# Patient Record
Sex: Male | Born: 1940 | Race: White | Hispanic: No | Marital: Single | Smoking: Former smoker
Health system: Southern US, Community
[De-identification: ages and names within clinical notes are randomized; demographics above are authoritative.]

## PROBLEM LIST (undated history)

## (undated) DIAGNOSIS — E7800 Pure hypercholesterolemia, unspecified: Secondary | ICD-10-CM

## (undated) DIAGNOSIS — I214 Non-ST elevation (NSTEMI) myocardial infarction: Secondary | ICD-10-CM

## (undated) DIAGNOSIS — K5641 Fecal impaction: Secondary | ICD-10-CM

## (undated) DIAGNOSIS — D649 Anemia, unspecified: Secondary | ICD-10-CM

## (undated) DIAGNOSIS — I639 Cerebral infarction, unspecified: Secondary | ICD-10-CM

## (undated) DIAGNOSIS — H47619 Cortical blindness, unspecified side of brain: Secondary | ICD-10-CM

## (undated) DIAGNOSIS — K219 Gastro-esophageal reflux disease without esophagitis: Secondary | ICD-10-CM

## (undated) DIAGNOSIS — I10 Essential (primary) hypertension: Secondary | ICD-10-CM

## (undated) DIAGNOSIS — F17200 Nicotine dependence, unspecified, uncomplicated: Secondary | ICD-10-CM

---

## 2009-12-11 ENCOUNTER — Encounter: Admission: RE | Admit: 2009-12-11 | Discharge: 2009-12-11 | Payer: Self-pay | Admitting: Neurology

## 2009-12-25 ENCOUNTER — Telehealth (INDEPENDENT_AMBULATORY_CARE_PROVIDER_SITE_OTHER): Payer: Self-pay | Admitting: *Deleted

## 2009-12-30 ENCOUNTER — Ambulatory Visit: Payer: Self-pay | Admitting: Internal Medicine

## 2009-12-30 ENCOUNTER — Ambulatory Visit: Payer: Self-pay

## 2009-12-30 ENCOUNTER — Encounter (INDEPENDENT_AMBULATORY_CARE_PROVIDER_SITE_OTHER): Payer: Self-pay | Admitting: Neurology

## 2009-12-30 ENCOUNTER — Ambulatory Visit (HOSPITAL_COMMUNITY): Admission: RE | Admit: 2009-12-30 | Discharge: 2009-12-30 | Payer: Self-pay | Admitting: Neurology

## 2010-01-31 ENCOUNTER — Emergency Department (HOSPITAL_COMMUNITY): Admission: EM | Admit: 2010-01-31 | Discharge: 2010-01-31 | Payer: Self-pay | Admitting: Emergency Medicine

## 2010-04-08 NOTE — Progress Notes (Signed)
Summary: event monitor  Phone Note Outgoing Call Call back at [phone]   Call placed by: Stanton Kidney, EMT-P,  December 25, 2009 2:36 PM Call placed to: Patient Action Taken: Phone Call Completed, Appt scheduled Summary of Call: Event monitor scheduled same day as echo, 12/30/09. Stanton Kidney, EMT-P  December 25, 2009 2:36 PM

## 2011-03-03 ENCOUNTER — Inpatient Hospital Stay (HOSPITAL_COMMUNITY): Payer: Medicare Other

## 2011-03-03 ENCOUNTER — Inpatient Hospital Stay (HOSPITAL_COMMUNITY)
Admission: EM | Admit: 2011-03-03 | Discharge: 2011-03-27 | DRG: 871 | Disposition: A | Discharge status: Skilled Nursing Facility | Payer: Medicare Other | Attending: Internal Medicine | Admitting: Internal Medicine

## 2011-03-03 ENCOUNTER — Encounter: Payer: Self-pay | Admitting: Emergency Medicine

## 2011-03-03 ENCOUNTER — Emergency Department (HOSPITAL_COMMUNITY): Payer: Medicare Other

## 2011-03-03 ENCOUNTER — Other Ambulatory Visit: Payer: Self-pay

## 2011-03-03 DIAGNOSIS — R1310 Dysphagia, unspecified: Secondary | ICD-10-CM | POA: Clinically undetermined

## 2011-03-03 DIAGNOSIS — J189 Pneumonia, unspecified organism: Secondary | ICD-10-CM | POA: Diagnosis present

## 2011-03-03 DIAGNOSIS — R7309 Other abnormal glucose: Secondary | ICD-10-CM | POA: Diagnosis present

## 2011-03-03 DIAGNOSIS — J9600 Acute respiratory failure, unspecified whether with hypoxia or hypercapnia: Secondary | ICD-10-CM | POA: Diagnosis present

## 2011-03-03 DIAGNOSIS — T380X5A Adverse effect of glucocorticoids and synthetic analogues, initial encounter: Secondary | ICD-10-CM | POA: Diagnosis present

## 2011-03-03 DIAGNOSIS — R310 Gross hematuria: Secondary | ICD-10-CM | POA: Diagnosis not present

## 2011-03-03 DIAGNOSIS — D75839 Thrombocytosis, unspecified: Secondary | ICD-10-CM | POA: Diagnosis present

## 2011-03-03 DIAGNOSIS — A419 Sepsis, unspecified organism: Principal | ICD-10-CM | POA: Diagnosis present

## 2011-03-03 DIAGNOSIS — D649 Anemia, unspecified: Secondary | ICD-10-CM | POA: Diagnosis present

## 2011-03-03 DIAGNOSIS — K441 Diaphragmatic hernia with gangrene: Secondary | ICD-10-CM | POA: Diagnosis present

## 2011-03-03 DIAGNOSIS — G92 Toxic encephalopathy: Secondary | ICD-10-CM | POA: Diagnosis present

## 2011-03-03 DIAGNOSIS — E871 Hypo-osmolality and hyponatremia: Secondary | ICD-10-CM | POA: Diagnosis not present

## 2011-03-03 DIAGNOSIS — E860 Dehydration: Secondary | ICD-10-CM | POA: Diagnosis present

## 2011-03-03 DIAGNOSIS — J9690 Respiratory failure, unspecified, unspecified whether with hypoxia or hypercapnia: Secondary | ICD-10-CM | POA: Diagnosis present

## 2011-03-03 DIAGNOSIS — I4891 Unspecified atrial fibrillation: Secondary | ICD-10-CM

## 2011-03-03 DIAGNOSIS — Z8673 Personal history of transient ischemic attack (TIA), and cerebral infarction without residual deficits: Secondary | ICD-10-CM

## 2011-03-03 DIAGNOSIS — K5900 Constipation, unspecified: Secondary | ICD-10-CM | POA: Diagnosis present

## 2011-03-03 DIAGNOSIS — R1110 Vomiting, unspecified: Secondary | ICD-10-CM

## 2011-03-03 DIAGNOSIS — R509 Fever, unspecified: Secondary | ICD-10-CM | POA: Diagnosis present

## 2011-03-03 DIAGNOSIS — E876 Hypokalemia: Secondary | ICD-10-CM | POA: Diagnosis present

## 2011-03-03 DIAGNOSIS — R739 Hyperglycemia, unspecified: Secondary | ICD-10-CM | POA: Diagnosis present

## 2011-03-03 DIAGNOSIS — H47619 Cortical blindness, unspecified side of brain: Secondary | ICD-10-CM | POA: Clinically undetermined

## 2011-03-03 DIAGNOSIS — E870 Hyperosmolality and hypernatremia: Secondary | ICD-10-CM | POA: Diagnosis not present

## 2011-03-03 DIAGNOSIS — R7989 Other specified abnormal findings of blood chemistry: Secondary | ICD-10-CM | POA: Diagnosis present

## 2011-03-03 DIAGNOSIS — E785 Hyperlipidemia, unspecified: Secondary | ICD-10-CM | POA: Diagnosis present

## 2011-03-03 DIAGNOSIS — J811 Chronic pulmonary edema: Secondary | ICD-10-CM | POA: Diagnosis not present

## 2011-03-03 DIAGNOSIS — K219 Gastro-esophageal reflux disease without esophagitis: Secondary | ICD-10-CM | POA: Diagnosis present

## 2011-03-03 DIAGNOSIS — G929 Unspecified toxic encephalopathy: Secondary | ICD-10-CM | POA: Diagnosis present

## 2011-03-03 DIAGNOSIS — R4182 Altered mental status, unspecified: Secondary | ICD-10-CM

## 2011-03-03 DIAGNOSIS — D638 Anemia in other chronic diseases classified elsewhere: Secondary | ICD-10-CM | POA: Diagnosis present

## 2011-03-03 DIAGNOSIS — M6282 Rhabdomyolysis: Secondary | ICD-10-CM | POA: Diagnosis present

## 2011-03-03 DIAGNOSIS — G8929 Other chronic pain: Secondary | ICD-10-CM | POA: Diagnosis present

## 2011-03-03 DIAGNOSIS — D473 Essential (hemorrhagic) thrombocythemia: Secondary | ICD-10-CM | POA: Diagnosis present

## 2011-03-03 DIAGNOSIS — I214 Non-ST elevation (NSTEMI) myocardial infarction: Secondary | ICD-10-CM | POA: Diagnosis present

## 2011-03-03 DIAGNOSIS — I10 Essential (primary) hypertension: Secondary | ICD-10-CM | POA: Diagnosis present

## 2011-03-03 DIAGNOSIS — K5641 Fecal impaction: Secondary | ICD-10-CM | POA: Diagnosis not present

## 2011-03-03 DIAGNOSIS — D72829 Elevated white blood cell count, unspecified: Secondary | ICD-10-CM | POA: Diagnosis present

## 2011-03-03 HISTORY — DX: Nicotine dependence, unspecified, uncomplicated: F17.200

## 2011-03-03 HISTORY — DX: Fecal impaction: K56.41

## 2011-03-03 HISTORY — DX: Gastro-esophageal reflux disease without esophagitis: K21.9

## 2011-03-03 HISTORY — DX: Non-ST elevation (NSTEMI) myocardial infarction: I21.4

## 2011-03-03 HISTORY — DX: Anemia, unspecified: D64.9

## 2011-03-03 HISTORY — DX: Cortical blindness, unspecified side of brain: H47.619

## 2011-03-03 HISTORY — DX: Cerebral infarction, unspecified: I63.9

## 2011-03-03 HISTORY — DX: Pure hypercholesterolemia, unspecified: E78.00

## 2011-03-03 HISTORY — DX: Essential (primary) hypertension: I10

## 2011-03-03 LAB — COMPREHENSIVE METABOLIC PANEL
ALT: 78 U/L — ABNORMAL HIGH (ref 0–53)
AST: 220 U/L — ABNORMAL HIGH (ref 0–37)
Alkaline Phosphatase: 47 U/L (ref 39–117)
CO2: 23 mEq/L (ref 19–32)
GFR calc Af Amer: 59 mL/min — ABNORMAL LOW (ref >90)
Glucose, Bld: 115 mg/dL — ABNORMAL HIGH (ref 70–99)
Total Protein: 8.4 g/dL — ABNORMAL HIGH (ref 6.0–8.3)

## 2011-03-03 LAB — URINALYSIS, ROUTINE W REFLEX MICROSCOPIC
Bilirubin Urine: NEGATIVE
Glucose, UA: NEGATIVE mg/dL
Ketones, ur: NEGATIVE mg/dL
Leukocytes, UA: NEGATIVE
Nitrite: NEGATIVE
Protein, ur: 100 mg/dL — AB
Specific Gravity, Urine: 1.025 (ref 1.005–1.030)
Urobilinogen, UA: 0.2 mg/dL (ref 0.0–1.0)
pH: 5.5 (ref 5.0–8.0)

## 2011-03-03 LAB — COMPREHENSIVE METABOLIC PANEL WITH GFR
Albumin: 3.3 g/dL — ABNORMAL LOW (ref 3.5–5.2)
BUN: 57 mg/dL — ABNORMAL HIGH (ref 6–23)
Calcium: 9.9 mg/dL (ref 8.4–10.5)
Chloride: 105 meq/L (ref 96–112)
Creatinine, Ser: 1.35 mg/dL (ref 0.50–1.35)
GFR calc non Af Amer: 51 mL/min — ABNORMAL LOW (ref >90)
Potassium: 3.2 meq/L — ABNORMAL LOW (ref 3.5–5.1)
Sodium: 144 meq/L (ref 135–145)
Total Bilirubin: 1.1 mg/dL (ref 0.3–1.2)

## 2011-03-03 LAB — DIFFERENTIAL
Basophils Absolute: 0 10*3/uL (ref 0.0–0.1)
Basophils Relative: 0 % (ref 0–1)
Eosinophils Absolute: 0 10*3/uL (ref 0.0–0.7)
Eosinophils Relative: 0 % (ref 0–5)
Lymphocytes Relative: 3 % — ABNORMAL LOW (ref 12–46)
Lymphs Abs: 0.4 K/uL — ABNORMAL LOW (ref 0.7–4.0)
Monocytes Absolute: 0.2 10*3/uL (ref 0.1–1.0)
Monocytes Relative: 2 % — ABNORMAL LOW (ref 3–12)
Neutro Abs: 11.7 K/uL — ABNORMAL HIGH (ref 1.7–7.7)
Neutrophils Relative %: 95 % — ABNORMAL HIGH (ref 43–77)

## 2011-03-03 LAB — CARDIAC PANEL(CRET KIN+CKTOT+MB+TROPI)
CK, MB: 9 ng/mL (ref 0.3–4.0)
Relative Index: 0.1 (ref 0.0–2.5)
Total CK: 6643 U/L — ABNORMAL HIGH (ref 7–232)
Troponin I: 0.4 ng/mL (ref <0.30)

## 2011-03-03 LAB — CBC
HCT: 36.6 % — ABNORMAL LOW (ref 39.0–52.0)
Hemoglobin: 12.7 g/dL — ABNORMAL LOW (ref 13.0–17.0)
MCH: 33.1 pg (ref 26.0–34.0)
MCHC: 34.7 g/dL (ref 30.0–36.0)
MCV: 95.3 fL (ref 78.0–100.0)
Platelets: 428 10*3/uL — ABNORMAL HIGH (ref 150–400)
RBC: 3.84 MIL/uL — ABNORMAL LOW (ref 4.22–5.81)
RDW: 13.4 % (ref 11.5–15.5)
WBC: 12.3 K/uL — ABNORMAL HIGH (ref 4.0–10.5)

## 2011-03-03 LAB — URINE MICROSCOPIC-ADD ON

## 2011-03-03 LAB — BLOOD GAS, ARTERIAL
Acid-base deficit: 1.3 mmol/L (ref 0.0–2.0)
Bicarbonate: 18.6 meq/L — ABNORMAL LOW (ref 20.0–24.0)
Bicarbonate: 18.4 mEq/L — ABNORMAL LOW (ref 20.0–24.0)
O2 Content: 3 L/min
O2 Saturation: 84.2 %
TCO2: 15.7 mmol/L (ref 0–100)
pCO2 arterial: 20.4 mmHg — ABNORMAL LOW (ref 35.0–45.0)
pCO2 arterial: 23.3 mmHg — ABNORMAL LOW (ref 35.0–45.0)
pH, Arterial: 7.567 — ABNORMAL HIGH (ref 7.350–7.450)
pH, Arterial: 7.51 — ABNORMAL HIGH (ref 7.350–7.450)
pO2, Arterial: 44 mmHg — ABNORMAL LOW (ref 80.0–100.0)
pO2, Arterial: 55.3 mmHg — ABNORMAL LOW (ref 80.0–100.0)

## 2011-03-03 LAB — ETHANOL: Alcohol, Ethyl (B): <11 mg/dL (ref 0–11)

## 2011-03-03 LAB — LACTIC ACID, PLASMA
Lactic Acid, Venous: 3.3 mmol/L — ABNORMAL HIGH (ref 0.5–2.2)
Lactic Acid, Venous: 2.2 mmol/L (ref 0.5–2.2)

## 2011-03-03 LAB — PHOSPHORUS: Phosphorus: 1.9 mg/dL — ABNORMAL LOW (ref 2.3–4.6)

## 2011-03-03 LAB — PROTIME-INR
INR: 1.27 (ref 0.00–1.49)
Prothrombin Time: 16.2 seconds — ABNORMAL HIGH (ref 11.6–15.2)

## 2011-03-03 LAB — APTT: aPTT: 48 seconds — ABNORMAL HIGH (ref 24–37)

## 2011-03-03 MED ORDER — ENOXAPARIN SODIUM 40 MG/0.4ML ~~LOC~~ SOLN
40.0000 mg | SUBCUTANEOUS | Status: DC
  Administered 2011-03-04 – 2011-03-17 (×14): 40 mg via SUBCUTANEOUS
  Filled 2011-03-03 (×14): qty 0.4

## 2011-03-03 MED ORDER — METOPROLOL TARTRATE 1 MG/ML IV SOLN
5.0000 mg | Freq: Four times a day (QID) | INTRAVENOUS | Status: DC | PRN
  Filled 2011-03-03: qty 5

## 2011-03-03 MED ORDER — SODIUM CHLORIDE 0.9 % IV SOLN
INTRAVENOUS | Status: DC
  Administered 2011-03-03 – 2011-03-04 (×2): 100 mL via INTRAVENOUS
  Administered 2011-03-04: 75 mL via INTRAVENOUS
  Administered 2011-03-04: 1000 mL via INTRAVENOUS

## 2011-03-03 MED ORDER — ONDANSETRON HCL 4 MG/2ML IJ SOLN
4.0000 mg | Freq: Four times a day (QID) | INTRAMUSCULAR | Status: DC | PRN
  Administered 2011-03-12: 4 mg via INTRAVENOUS
  Filled 2011-03-03: qty 2

## 2011-03-03 MED ORDER — MORPHINE SULFATE 2 MG/ML IJ SOLN
2.0000 mg | Freq: Once | INTRAMUSCULAR | Status: AC
  Administered 2011-03-03: 2 mg via INTRAVENOUS
  Filled 2011-03-03: qty 1

## 2011-03-03 MED ORDER — OSELTAMIVIR PHOSPHATE 75 MG PO CAPS
75.0000 mg | ORAL_CAPSULE | Freq: Two times a day (BID) | ORAL | Status: DC
  Filled 2011-03-03: qty 1

## 2011-03-03 MED ORDER — HALOPERIDOL LACTATE 5 MG/ML IJ SOLN
5.0000 mg | Freq: Four times a day (QID) | INTRAMUSCULAR | Status: DC | PRN
  Filled 2011-03-03: qty 1

## 2011-03-03 MED ORDER — SODIUM CHLORIDE 0.9 % IV BOLUS (SEPSIS)
1000.0000 mL | Freq: Once | INTRAVENOUS | Status: AC
  Administered 2011-03-03: 1000 mL via INTRAVENOUS

## 2011-03-03 MED ORDER — PIPERACILLIN-TAZOBACTAM 3.375 G IVPB
3.3750 g | Freq: Three times a day (TID) | INTRAVENOUS | Status: DC
  Administered 2011-03-03: 3.375 g via INTRAVENOUS
  Filled 2011-03-03 (×3): qty 50

## 2011-03-03 MED ORDER — ACETAMINOPHEN 325 MG PO TABS
650.0000 mg | ORAL_TABLET | Freq: Four times a day (QID) | ORAL | Status: DC | PRN
  Administered 2011-03-05 – 2011-03-20 (×7): 650 mg via ORAL
  Filled 2011-03-03 (×6): qty 2

## 2011-03-03 MED ORDER — SODIUM CHLORIDE 0.9 % IV BOLUS (SEPSIS)
500.0000 mL | Freq: Once | INTRAVENOUS | Status: AC
  Administered 2011-03-03: 500 mL via INTRAVENOUS

## 2011-03-03 MED ORDER — PIPERACILLIN-TAZOBACTAM 3.375 G IVPB
INTRAVENOUS | Status: AC
  Filled 2011-03-03: qty 50

## 2011-03-03 MED ORDER — ACETAMINOPHEN 650 MG RE SUPP
650.0000 mg | Freq: Four times a day (QID) | RECTAL | Status: DC | PRN
  Administered 2011-03-04: 650 mg via RECTAL
  Filled 2011-03-03 (×2): qty 1

## 2011-03-03 MED ORDER — THIAMINE HCL 100 MG/ML IJ SOLN
100.0000 mg | Freq: Once | INTRAMUSCULAR | Status: AC
Start: 2011-03-03 — End: 2011-03-03
  Administered 2011-03-03: 100 mg via INTRAVENOUS
  Filled 2011-03-03: qty 2

## 2011-03-03 MED ORDER — PIPERACILLIN-TAZOBACTAM 3.375 G IVPB
3.3750 g | Freq: Once | INTRAVENOUS | Status: AC
  Administered 2011-03-03: 3.375 g via INTRAVENOUS
  Filled 2011-03-03: qty 50

## 2011-03-03 MED ORDER — DEXTROSE 5 % IV SOLN
500.0000 mg | INTRAVENOUS | Status: DC
  Administered 2011-03-04 – 2011-03-14 (×11): 500 mg via INTRAVENOUS
  Filled 2011-03-03 (×12): qty 500

## 2011-03-03 MED ORDER — IPRATROPIUM BROMIDE 0.02 % IN SOLN: 0.5000 mg | RESPIRATORY_TRACT | Status: DC

## 2011-03-03 MED ORDER — METHYLPREDNISOLONE SODIUM SUCC 125 MG IJ SOLR
80.0000 mg | Freq: Three times a day (TID) | INTRAMUSCULAR | Status: DC
  Administered 2011-03-03 – 2011-03-06 (×7): 80 mg via INTRAVENOUS
  Filled 2011-03-03 (×7): qty 2

## 2011-03-03 MED ORDER — FOLIC ACID 1 MG PO TABS
1.0000 mg | ORAL_TABLET | Freq: Once | ORAL | Status: DC
  Filled 2011-03-03: qty 1

## 2011-03-03 MED ORDER — ACETAMINOPHEN 650 MG RE SUPP
650.0000 mg | Freq: Once | RECTAL | Status: AC
  Administered 2011-03-03: 650 mg via RECTAL
  Filled 2011-03-03: qty 1

## 2011-03-03 MED ORDER — LORAZEPAM 2 MG/ML IJ SOLN
0.5000 mg | Freq: Once | INTRAMUSCULAR | Status: AC
  Administered 2011-03-03: 0.5 mg via INTRAVENOUS
  Filled 2011-03-03: qty 1

## 2011-03-03 MED ORDER — ALBUTEROL SULFATE (5 MG/ML) 0.5% IN NEBU
2.5000 mg | INHALATION_SOLUTION | RESPIRATORY_TRACT | Status: DC | PRN
  Administered 2011-03-03: 2.5 mg via RESPIRATORY_TRACT
  Filled 2011-03-03: qty 0.5

## 2011-03-03 MED ORDER — ACETAMINOPHEN 325 MG PO TABS
650.0000 mg | ORAL_TABLET | Freq: Once | ORAL | Status: DC
  Filled 2011-03-03: qty 2

## 2011-03-03 MED ORDER — ONDANSETRON HCL 4 MG PO TABS: 4.0000 mg | ORAL_TABLET | Freq: Four times a day (QID) | ORAL | Status: DC | PRN

## 2011-03-03 NOTE — Progress Notes (Addendum)
2020-Pt's saturation began to drop and RT was notified and venti mask was applied at 100% 0120-E-Link MD initiated steps to intubate pt. 02 saturations were low and RR in upper 30's to low 40's. ED physician was notified and came and intubated pt at 0045 and pt tolerated well. Propofol was ordered and administered. Propofol was discontinued because of low BP and fentanyl and versed was then ordered.   0125-attempted to contact family but unable to reach anyone.

## 2011-03-03 NOTE — ED Notes (Signed)
Pt not cooperative during neuro assessment. Pt will not answer any question.

## 2011-03-03 NOTE — ED Notes (Signed)
Per ems pt was given 1mg  of narcan and became a little more alert. Pt has prescription for fentanyl patches. No patches were found on the pt during assessment. Pt has clothing saturated in urine and feces.

## 2011-03-03 NOTE — ED Notes (Signed)
Per Resp. o2 sat was 84% o2 increased to 4lpm.

## 2011-03-03 NOTE — Progress Notes (Signed)
eLink Physician-Brief Progress Note Patient Name: Marcus [name] DOB: [birth year] MRN: [id number]  Date of Service  03/03/2011   HPI/Events of Note   Code sepsis alert in elink. Caser reviewed. He arrived from ER around 19:30 per RN  1. Lactate cleared and BP normal 2. But active SIRS still +, febrile, tachycardic, tacyphenic RR 28 but not paradoxical 3. Encephalopathy  + - RASS -2 to +2 fluctuates  4. PNA - CXR - evolveing ARDS ?  eICU Interventions  1. Haldol for agitation 2. Morphine for agitation/dyspnea.  3. Bipap for resp failure 4. Check abg and cxr  5. No sepsis protocol for now 6. If enceph or resp failure worsens, intubate and start cvl   Intervention Category Major Interventions: Sepsis - evaluation and management;Change in mental status - evaluation and management;Delirium, psychosis, severe agitation - evaluation and management;Respiratory failure - evaluation and management  Danni Leabo 03/03/2011, 10:06 PM

## 2011-03-03 NOTE — ED Notes (Signed)
Pt daughter in room states that patient received a flu and pneumonia shot on December 21 and that is when she noticed that he began to get sick.
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
## 2011-03-03 NOTE — Progress Notes (Signed)
eLink Physician-Brief Progress Note Patient Name: Shemuel Harkleroad DOB: Sep 20, 1940 MRN: [id number]  Date of Service  03/03/2011   HPI/Events of Note  camerla care and d./w RN 1. Agiation - resolved after morphine. So haldol not given 2. REsp distress - improved significantly after morphine and starting bipap. CXR - unchanged and abg - resp alk 3. INfection - on azithromycin and zosyn. Per RN family feel flu shot caused all this on 12/21. COPD notd on CT chest. Flu PCR being sent  eICU Interventions  1. Agitation  - continue to monitor. If worsenes, give morphine/haldol 2. Resp failure - continue bipap and npo 3. INfection - start tamiflu. Check urine leg and urine strep. Check 18 panel Resp Virus Multiplex PCR   Intervention Category Major Interventions: Delirium, psychosis, severe agitation - evaluation and management;Respiratory failure - evaluation and management  Marye Eagen 03/03/2011, 11:04 PM

## 2011-03-03 NOTE — ED Notes (Signed)
Radiology at the bedside at this time

## 2011-03-03 NOTE — ED Notes (Signed)
hospitalist at the bedside at this time

## 2011-03-03 NOTE — ED Notes (Signed)
Critical result received and given to EDP for CK MB of 9 and Troponin of 0.4.

## 2011-03-03 NOTE — ED Notes (Signed)
MD at bedside. 

## 2011-03-03 NOTE — ED Notes (Signed)
Patient transported to CT 

## 2011-03-03 NOTE — ED Notes (Signed)
Pt was found by ems on bed filled with urine and feces. Per family pt started acting strange last night. Pt c/o lower abd pain. Blood sugar was 117.

## 2011-03-03 NOTE — ED Provider Notes (Cosign Needed)
History     CSN: [id number]  Arrival date & time 03/03/11  1429   First MD Initiated Contact with Patient 03/03/11 1437      Chief Complaint  Patient presents with  . Altered Mental Status    (Consider location/radiation/quality/duration/timing/severity/associated sxs/prior treatment) HPI Comments: Patient is brought by EMS from home in [place], [place]. Per EMS providers, the patient lives with his daughter but they had the impression that the family members present may not be very involved in the patient's care. Supposedly the patient began becoming more confused yesterday after having an episode of vomiting. The patient is awake but agitated and confused and not able to verbalize any significant history. No specific interventions were performed by EMS. I did review there monitor strips which appears to show sinus rhythm. Reportedly the patient does have a prior history of stroke, but other pertinent past medical history was not easily obtainable from the patient's family. The patient was found apparently in his own urine and feces on his bed by EMS. Level 5 caveat due to confusion.    Patient is a 70 y.o. male presenting with altered mental status. The history is provided by the EMS personnel and medical records. The history is limited by the condition of the patient and the absence of a caregiver.  Altered Mental Status    Past Medical History  Diagnosis Date  . Hypertension   . GERD (gastroesophageal reflux disease)   . Hypercholesteremia   . Stroke     History reviewed. No pertinent past surgical history.  History reviewed. No pertinent family history.  History  Substance Use Topics  . Smoking status: Not on file  . Smokeless tobacco: Not on file  . Alcohol Use: Not on file      Review of Systems  Unable to perform ROS: Mental status change  Psychiatric/Behavioral: Positive for altered mental status.    Allergies  Review of patient's allergies  indicates not on file.  Home Medications   Current Outpatient Rx  Name Route Sig Dispense Refill  . BENAZEPRIL HCL 10 MG PO TABS Oral Take 10 mg by mouth at bedtime.      . CHOLINE FENOFIBRATE 45 MG PO CPDR Oral Take 45 mg by mouth daily.      . FENTANYL 100 MCG/HR TD PT72 Transdermal Place 1 patch onto the skin every 3 (three) days. Remove and discard used patches     . FLUTICASONE PROPIONATE 50 MCG/ACT NA SUSP Nasal Place 2 sprays into the nose daily.      Marland Kitchen GABAPENTIN 400 MG PO CAPS Oral Take 400 mg by mouth 2 (two) times daily.      Marland Kitchen HYDROCHLOROTHIAZIDE 25 MG PO TABS Oral Take 25 mg by mouth every morning.      Marland Kitchen HYDROCODONE-ACETAMINOPHEN 10-325 MG PO TABS Oral Take 1 tablet by mouth 3 (three) times daily as needed. For pain     . OMEPRAZOLE 40 MG PO CPDR Oral Take 40 mg by mouth daily.      . SERTRALINE HCL 100 MG PO TABS Oral Take 100 mg by mouth daily.      Marland Kitchen SIMVASTATIN 40 MG PO TABS Oral Take 40 mg by mouth daily.        BP 159/70  Pulse 102  Temp(Src) 100.6 F (38.1 C) (Rectal)  Resp 30  SpO2 100%  Physical Exam  Nursing note and vitals reviewed. Constitutional: He appears well-developed.  HENT:  Head: Normocephalic.  Mouth/Throat: Mucous membranes are  dry.       Tongue and lips are cracked and very dry  Eyes: Pupils are equal, round, and reactive to light. No scleral icterus.    Neck: Normal range of motion. Neck supple.  Cardiovascular: Regular rhythm, S1 normal and intact distal pulses.   Extrasystoles are present. Tachycardia present.   No murmur heard. Pulmonary/Chest: Tachypnea noted. He is in respiratory distress. He has no decreased breath sounds. He has no wheezes.  Abdominal: Soft. He exhibits no distension. There is no tenderness. There is no rebound and no CVA tenderness.  Genitourinary: No penile tenderness.  Musculoskeletal: Normal range of motion.  Neurological: He is alert. GCS eye subscore is 4. GCS verbal subscore is 3. GCS motor subscore is 5.         Pt is purposeful with both arms, undressing himself.  No obv facial droop.  Will not follow simple commands and verbalizes with non-sensical words and grunting.    Skin: Skin is dry and intact. No abrasion, no bruising, no ecchymosis and no rash noted. He is not diaphoretic.       Areas of cyanosis, skin is cool, cap refill is at 3-4 sec    ED Course  Procedures (including critical care time) CRITICAL CARE Performed by: Lear Ng.   Total critical care time: 40 min  Critical care time was exclusive of separately billable procedures and treating other patients.  Critical care was necessary to treat or prevent imminent or life-threatening deterioration.  Critical care was time spent personally by me on the following activities: development of treatment plan with patient and/or surrogate as well as nursing, discussions with consultants, evaluation of patient's response to treatment, examination of patient, obtaining history from patient or surrogate, ordering and performing treatments and interventions, ordering and review of laboratory studies, ordering and review of radiographic studies, pulse oximetry and re-evaluation of patient's condition.   Labs Reviewed  CBC - Abnormal; Notable for the following:    WBC 12.3 (*)    RBC 3.84 (*)    Hemoglobin 12.7 (*)    HCT 36.6 (*)    Platelets 428 (*)    All other components within normal limits  DIFFERENTIAL - Abnormal; Notable for the following:    Neutrophils Relative 95 (*)    Lymphocytes Relative 3 (*)    Monocytes Relative 2 (*)    Neutro Abs 11.7 (*)    Lymphs Abs 0.4 (*)    All other components within normal limits  BLOOD GAS, ARTERIAL - Abnormal; Notable for the following:    pH, Arterial 7.567 (*)    pCO2 arterial 20.4 (*)    pO2, Arterial 44.0 (*)    Bicarbonate 18.6 (*)    Allens test (pass/fail) NOT INDICATED (*)    All other components within normal limits  COMPREHENSIVE METABOLIC PANEL - Abnormal; Notable  for the following:    Potassium 3.2 (*)    Glucose, Bld 115 (*)    BUN 57 (*)    Total Protein 8.4 (*)    Albumin 3.3 (*)    AST 220 (*)    ALT 78 (*)    GFR calc non Af Amer 51 (*)    GFR calc Af Amer 59 (*)    All other components within normal limits  URINALYSIS, ROUTINE W REFLEX MICROSCOPIC - Abnormal; Notable for the following:    Hgb urine dipstick LARGE (*)    Protein, ur 100 (*)    All other components within normal limits  CARDIAC PANEL(CRET KIN+CKTOT+MB+TROPI) - Abnormal; Notable for the following:    Total CK 6643 (*)    CK, MB 9.0 (*)    Troponin I 0.40 (*)    All other components within normal limits  PROTIME-INR - Abnormal; Notable for the following:    Prothrombin Time 16.2 (*)    All other components within normal limits  APTT - Abnormal; Notable for the following:    aPTT 48 (*)    All other components within normal limits  LACTIC ACID, PLASMA - Abnormal; Notable for the following:    Lactic Acid, Venous 3.3 (*)    All other components within normal limits  URINE MICROSCOPIC-ADD ON - Abnormal; Notable for the following:    Casts HYALINE CASTS (*) GRANULAR CAST   All other components within normal limits  ETHANOL  CULTURE, BLOOD (ROUTINE X 2)  CULTURE, BLOOD (ROUTINE X 2)   No results found.   1. Dehydration   2. Altered mental status   3. Atrial fibrillation   4. Elevated troponin   5. Rhabdomyolysis      ECG at 15:15 shows rate 106, atrial fib with RVR, artifact, LAD, non specific ST and T wave changes, no prior ECG's. MDM  I reviewed EPIC and old records, had MRI in Oct 2011, interpreted by [facility].  It appears on my view that pt had a left sided CVA at that time.  No new focal deficits here, more that pt is agitated and altered due to combination of dehydration, moderate, and also possibly infection such as PNA or UTI.  Will check core temp, ABG, lactate as well as give IVF's and pt will need admit.  Head CT pending as well.       3:24 PM Pt's  pulse ox shows 96% on O2 by [place].  ABG shows sat of 84% with very low pO2, I suspect may be venous sample.  PH is elevated with low pCO2, and bicarb     4:27 PM Rectal temp reveals low grade fever of 100.6.  Cultures ordered, will give Zosyn for now.  Possibly GI involvement with elevation of LFT's, UA showed no UTI, CXR is still pending but with tachypnea and possibly low sats, CAP may be possible.  Zosyn would provide coverage for both lung and abdominal.  BUN is very elevated consistent with his clinical dehydrated status.     4:53 PM Pt's troponin is slightly up, but I think it is not a primary ischemic event.  Pt's CKMB is also up, but in setting of elevation of total CK.  Pt is very dehydration which likely accounts for elevation of troponin.  I think can be serially monitored in the hospital.     5:49 PM Dr. Lendell Caprice with [facility] to see and admit. I reviewed pts head CT as well, per radioligst reports no new changes.  Chronic microvascular abn's.   Gavin Pound. Jannifer Fischler, MD 03/03/11 1750

## 2011-03-03 NOTE — ED Notes (Signed)
Pt back from CT

## 2011-03-03 NOTE — Progress Notes (Signed)
Pt has several medications that were brought in with him that were prescribed by different MD's. He is not able to answer any questions at this time

## 2011-03-03 NOTE — ED Notes (Signed)
Pt incontinent of feces. Pt cleaned by this RN and Nurse Tech.

## 2011-03-03 NOTE — ED Notes (Signed)
Pt clothing removed and pt was cleaned up.

## 2011-03-04 ENCOUNTER — Inpatient Hospital Stay (HOSPITAL_COMMUNITY): Payer: Medicare Other

## 2011-03-04 DIAGNOSIS — J9690 Respiratory failure, unspecified, unspecified whether with hypoxia or hypercapnia: Secondary | ICD-10-CM | POA: Diagnosis present

## 2011-03-04 DIAGNOSIS — E876 Hypokalemia: Secondary | ICD-10-CM | POA: Diagnosis present

## 2011-03-04 LAB — BLOOD GAS, ARTERIAL
Drawn by: 21310
MECHVT: 480 mL
PEEP: 5 cmH2O
RATE: 16 resp/min
pH, Arterial: 7.435 (ref 7.350–7.450)

## 2011-03-04 LAB — CBC
Hemoglobin: 10.6 g/dL — ABNORMAL LOW (ref 13.0–17.0)
MCH: 32.2 pg (ref 26.0–34.0)
RBC: 3.29 MIL/uL — ABNORMAL LOW (ref 4.22–5.81)
WBC: 9.5 10*3/uL (ref 4.0–10.5)

## 2011-03-04 LAB — CARDIAC PANEL(CRET KIN+CKTOT+MB+TROPI)
CK, MB: 6 ng/mL — ABNORMAL HIGH (ref 0.3–4.0)
Relative Index: 0.2 (ref 0.0–2.5)
Troponin I: 0.74 ng/mL (ref <0.30)
Troponin I: 1.03 ng/mL (ref <0.30)

## 2011-03-04 LAB — DIFFERENTIAL
Eosinophils Absolute: 0 10*3/uL (ref 0.0–0.7)
Lymphocytes Relative: 3 % — ABNORMAL LOW (ref 12–46)
Lymphs Abs: 0.3 10*3/uL — ABNORMAL LOW (ref 0.7–4.0)
Monocytes Relative: 2 % — ABNORMAL LOW (ref 3–12)
Neutro Abs: 9 10*3/uL — ABNORMAL HIGH (ref 1.7–7.7)
Neutrophils Relative %: 95 % — ABNORMAL HIGH (ref 43–77)

## 2011-03-04 LAB — GLUCOSE, CAPILLARY: Glucose-Capillary: 183 mg/dL — ABNORMAL HIGH (ref 70–99)

## 2011-03-04 LAB — LEGIONELLA ANTIGEN, URINE: Legionella Antigen, Urine: NEGATIVE

## 2011-03-04 MED ORDER — PROPOFOL 10 MG/ML IV EMUL
5.0000 ug/kg/min | INTRAVENOUS | Status: DC
  Administered 2011-03-04: 1000 mg via INTRAVENOUS

## 2011-03-04 MED ORDER — ROCURONIUM BROMIDE 50 MG/5ML IV SOLN
INTRAVENOUS | Status: AC
  Filled 2011-03-04: qty 2

## 2011-03-04 MED ORDER — PANTOPRAZOLE SODIUM 40 MG PO PACK
40.0000 mg | PACK | Freq: Every day | ORAL | Status: DC
  Administered 2011-03-04 – 2011-03-15 (×11): 40 mg
  Filled 2011-03-04 (×14): qty 20

## 2011-03-04 MED ORDER — FENTANYL CITRATE 0.05 MG/ML IJ SOLN
INTRAMUSCULAR | Status: AC
  Filled 2011-03-04: qty 50

## 2011-03-04 MED ORDER — JEVITY 1.2 CAL PO LIQD
1000.0000 mL | ORAL | Status: DC
  Administered 2011-03-04 – 2011-03-12 (×7): 1000 mL via ORAL
  Filled 2011-03-04 (×8): qty 1000

## 2011-03-04 MED ORDER — PIPERACILLIN-TAZOBACTAM 3.375 G IVPB
3.3750 g | Freq: Three times a day (TID) | INTRAVENOUS | Status: DC
  Administered 2011-03-04 – 2011-03-15 (×33): 3.375 g via INTRAVENOUS
  Filled 2011-03-04 (×37): qty 50

## 2011-03-04 MED ORDER — IPRATROPIUM-ALBUTEROL 18-103 MCG/ACT IN AERO: 6.0000 | INHALATION_SPRAY | RESPIRATORY_TRACT | Status: DC | PRN

## 2011-03-04 MED ORDER — BIOTENE DRY MOUTH MT LIQD
1.0000 "application " | Freq: Four times a day (QID) | OROMUCOSAL | Status: DC
  Administered 2011-03-04 – 2011-03-27 (×82): 15 mL via OROMUCOSAL

## 2011-03-04 MED ORDER — PANTOPRAZOLE SODIUM 40 MG IV SOLR
40.0000 mg | Freq: Every day | INTRAVENOUS | Status: DC
  Administered 2011-03-04: 40 mg via INTRAVENOUS
  Filled 2011-03-04: qty 40

## 2011-03-04 MED ORDER — SUCCINYLCHOLINE CHLORIDE 20 MG/ML IJ SOLN
INTRAMUSCULAR | Status: AC
  Administered 2011-03-04: 125 mg via INTRAVENOUS
  Filled 2011-03-04: qty 1

## 2011-03-04 MED ORDER — SODIUM CHLORIDE 0.9 % IV SOLN
1.0000 mg/h | INTRAVENOUS | Status: DC
  Administered 2011-03-04: 4 mg/h via INTRAVENOUS
  Administered 2011-03-05: 10 mg/h via INTRAVENOUS
  Administered 2011-03-05: 2 mg/h via INTRAVENOUS
  Administered 2011-03-05: 10 mg/h via INTRAVENOUS
  Administered 2011-03-06: 6 mg/h via INTRAVENOUS
  Administered 2011-03-06 (×2): 10 mg/h via INTRAVENOUS
  Administered 2011-03-07 – 2011-03-08 (×3): 4 mg/h via INTRAVENOUS
  Administered 2011-03-08: 6 mg/h via INTRAVENOUS
  Administered 2011-03-09: 4 mg/h via INTRAVENOUS
  Administered 2011-03-09: 7 mg/h via INTRAVENOUS
  Administered 2011-03-10: 4 mg/h via INTRAVENOUS
  Administered 2011-03-10: 5 mg/h via INTRAVENOUS
  Administered 2011-03-10 (×3): 4 mg/h via INTRAVENOUS
  Administered 2011-03-12: 6 mg/h via INTRAVENOUS
  Administered 2011-03-12: 5 mg/h via INTRAVENOUS
  Filled 2011-03-04 (×15): qty 10

## 2011-03-04 MED ORDER — OSELTAMIVIR PHOSPHATE 6 MG/ML PO SUSR
75.0000 mg | Freq: Two times a day (BID) | ORAL | Status: DC
  Filled 2011-03-04 (×2): qty 12.5

## 2011-03-04 MED ORDER — IPRATROPIUM-ALBUTEROL 18-103 MCG/ACT IN AERO
6.0000 | INHALATION_SPRAY | RESPIRATORY_TRACT | Status: DC
  Administered 2011-03-04 (×2): 6 via RESPIRATORY_TRACT
  Filled 2011-03-04: qty 14.7

## 2011-03-04 MED ORDER — SODIUM CHLORIDE 0.9 % IV SOLN
25.0000 ug/h | INTRAVENOUS | Status: DC
  Administered 2011-03-04: 50 ug/h via INTRAVENOUS
  Administered 2011-03-05: 200 ug/h via INTRAVENOUS
  Administered 2011-03-05: 110 ug/h via INTRAVENOUS
  Administered 2011-03-06: 120 ug/h via INTRAVENOUS
  Administered 2011-03-07: 200 ug/h via INTRAVENOUS
  Administered 2011-03-08: 150 ug/h via INTRAVENOUS
  Administered 2011-03-08: 300 ug/h via INTRAVENOUS
  Administered 2011-03-10: 180 ug/h via INTRAVENOUS
  Administered 2011-03-11: 50 ug/h via INTRAVENOUS
  Administered 2011-03-12: 200 ug/h via INTRAVENOUS
  Administered 2011-03-13: 300 ug/h via INTRAVENOUS
  Filled 2011-03-04 (×13): qty 50

## 2011-03-04 MED ORDER — CHLORHEXIDINE GLUCONATE 0.12 % MT SOLN
15.0000 mL | Freq: Two times a day (BID) | OROMUCOSAL | Status: DC
  Administered 2011-03-04 – 2011-03-27 (×45): 15 mL via OROMUCOSAL
  Filled 2011-03-04 (×44): qty 15

## 2011-03-04 MED ORDER — ETOMIDATE 2 MG/ML IV SOLN
INTRAVENOUS | Status: AC
  Administered 2011-03-04: 15 mg via INTRAVENOUS
  Filled 2011-03-04: qty 20

## 2011-03-04 MED ORDER — ALBUTEROL SULFATE (5 MG/ML) 0.5% IN NEBU
2.5000 mg | INHALATION_SOLUTION | Freq: Four times a day (QID) | RESPIRATORY_TRACT | Status: DC
  Administered 2011-03-04 – 2011-03-26 (×89): 2.5 mg via RESPIRATORY_TRACT
  Filled 2011-03-04 (×90): qty 0.5

## 2011-03-04 MED ORDER — PROPOFOL 10 MG/ML IV EMUL
INTRAVENOUS | Status: AC
  Administered 2011-03-04: 1000 mg via INTRAVENOUS
  Filled 2011-03-04: qty 100

## 2011-03-04 MED ORDER — LIDOCAINE HCL (CARDIAC) 20 MG/ML IV SOLN
INTRAVENOUS | Status: AC
  Filled 2011-03-04: qty 5

## 2011-03-04 MED ORDER — INSULIN ASPART 100 UNIT/ML ~~LOC~~ SOLN
0.0000 [IU] | SUBCUTANEOUS | Status: DC
  Administered 2011-03-04 – 2011-03-05 (×2): 2 [IU] via SUBCUTANEOUS
  Administered 2011-03-05: 3 [IU] via SUBCUTANEOUS
  Administered 2011-03-05: 2 [IU] via SUBCUTANEOUS
  Administered 2011-03-05: 5 [IU] via SUBCUTANEOUS
  Administered 2011-03-05: 3 [IU] via SUBCUTANEOUS
  Administered 2011-03-06: 5 [IU] via SUBCUTANEOUS
  Administered 2011-03-06 (×2): 3 [IU] via SUBCUTANEOUS

## 2011-03-04 MED ORDER — IPRATROPIUM BROMIDE 0.02 % IN SOLN
0.5000 mg | Freq: Four times a day (QID) | RESPIRATORY_TRACT | Status: DC
  Administered 2011-03-04 – 2011-03-26 (×89): 0.5 mg via RESPIRATORY_TRACT
  Filled 2011-03-04 (×88): qty 2.5

## 2011-03-04 MED ORDER — MIDAZOLAM HCL 50 MG/10ML IJ SOLN
INTRAMUSCULAR | Status: AC
  Filled 2011-03-04: qty 1

## 2011-03-04 MED ORDER — VANCOMYCIN HCL IN DEXTROSE 1-5 GM/200ML-% IV SOLN
1000.0000 mg | Freq: Once | INTRAVENOUS | Status: AC
  Administered 2011-03-04: 1000 mg via INTRAVENOUS
  Filled 2011-03-04: qty 200

## 2011-03-04 MED ORDER — VANCOMYCIN HCL 1000 MG IV SOLR
750.0000 mg | Freq: Two times a day (BID) | INTRAVENOUS | Status: DC
  Administered 2011-03-04 – 2011-03-11 (×15): 750 mg via INTRAVENOUS
  Filled 2011-03-04 (×17): qty 750

## 2011-03-04 NOTE — Progress Notes (Signed)
Overnight, patient became agitated and tachypneic, placed on BiPAP, then subsequently intubated. Per nursing, attempts  to call family members was unsuccessful. Now calm her on fentanyl and Versed drip.  Objective: Vital signs in last 24 hours: Filed Vitals:   03/04/11 0745 03/04/11 0800 03/04/11 0815 03/04/11 0830  BP: 106/53  94/50 97/49  Pulse: 78 72 73 72  Temp:      TempSrc:      Resp: 22 21 19 18   Height:      Weight:      SpO2: 97% 92% 95% 94%   Weight change:   Intake/Output Summary (Last 24 hours) at 03/04/11 0851 Last data filed at 03/04/11 0800  Gross per 24 hour  Intake 1419.8 ml  Output   1075 ml  Net  344.8 ml   Physical Exam: Gen.: Intubated sedated Lungs: Clear to auscultation bilaterally without wheeze rhonchi or rales Cardiovascular regular rate rhythm without murmurs gallops rubs Abdomen soft nontender nondistended Extremities no clubbing cyanosis or edema  Lab Results: Basic Metabolic Panel:  Lab 03/03/11 1610  NA 144  K 3.2*  CL 105  CO2 23  GLUCOSE 115*  BUN 57*  CREATININE 1.35  CALCIUM 9.9  MG --  PHOS 1.9*   Liver Function Tests:  Lab 03/03/11 1505  AST 220*  ALT 78*  ALKPHOS 47  BILITOT 1.1  PROT 8.4*  ALBUMIN 3.3*   No results found for this basename: LIPASE:2,AMYLASE:2 in the last 168 hours No results found for this basename: AMMONIA:2 in the last 168 hours CBC:  Lab 03/04/11 0529 03/03/11 1505  WBC 9.5 12.3*  NEUTROABS 9.0* 11.7*  HGB 10.6* 12.7*  HCT 31.4* 36.6*  MCV 95.4 95.3  PLT 369 428*   Cardiac Enzymes:  Lab 03/04/11 0529 03/03/11 1505  CKTOTAL 4155* 6643*  CKMB 6.7* 9.0*  CKMBINDEX -- --  TROPONINI 0.74* 0.40*   BNP: No results found for this basename: PROBNP:3 in the last 168 hours D-Dimer: No results found for this basename: DDIMER:2 in the last 168 hours CBG: No results found for this basename: GLUCAP:6 in the last 168 hours Hemoglobin A1C: No results found for this basename: HGBA1C in the last  168 hours Fasting Lipid Panel: No results found for this basename: CHOL,HDL,LDLCALC,TRIG,CHOLHDL,LDLDIRECT in the last 960 hours Thyroid Function Tests:  Lab 03/03/11 1505  TSH 0.872  T4TOTAL --  FREET4 --  T3FREE --  THYROIDAB --   Coagulation:  Lab 03/03/11 1505  LABPROT 16.2*  INR 1.27   Anemia Panel: No results found for this basename: VITAMINB12,FOLATE,FERRITIN,TIBC,IRON,RETICCTPCT in the last 168 hours Urine Drug Screen: Drugs of Abuse  No results found for this basename: labopia, cocainscrnur, labbenz, amphetmu, thcu, labbarb    Alcohol Level:  Lab 03/03/11 1505  ETH <11   Lactic acid 2.2. Pro calcitonin 70. Flu swab negative.  Micro Results: Recent Results (from the past 240 hour(s))  CULTURE, BLOOD (ROUTINE X 2)     Status: Normal (Preliminary result)   Collection Time   03/03/11  3:05 PM      Component Value Range Status Comment   Specimen Description BLOOD RIGHT ANTECUBITAL   Final    Special Requests     Final    Value: BOTTLES DRAWN AEROBIC AND ANAEROBIC 6CC EACH BOTTLE   Culture PENDING   Incomplete    Report Status PENDING   Incomplete   CULTURE, BLOOD (ROUTINE X 2)     Status: Normal (Preliminary result)   Collection Time  03/03/11  4:31 PM      Component Value Range Status Comment   Specimen Description BLOOD RIGHT HAND   Final    Special Requests BOTTLES DRAWN AEROBIC ONLY 6CC BOTTLE   Final    Culture PENDING   Incomplete    Report Status PENDING   Incomplete   MRSA PCR SCREENING     Status: Normal   Collection Time   03/03/11  6:35 PM      Component Value Range Status Comment   MRSA by PCR NEGATIVE  NEGATIVE  Final    Studies/Results: Ct Head Wo Contrast  03/03/2011  *RADIOLOGY REPORT*  Clinical Data: Altered mental status.  Patient found unresponsive and unable to follow commands.  CT HEAD WITHOUT CONTRAST  Technique:  Contiguous axial images were obtained from the base of the skull through the vertex without contrast.  Comparison:  Brain MRI, 12/11/2009  Findings: The ventricles are normal in overall configuration. There is ventricular and sulcal enlargement reflecting mild atrophy.  Encephalomalacia involves most of the occipital lobes bilaterally extending to the inferior parietal lobes.  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 Other old left basal ganglier lacunar infarcts are noted with additional lacunar infarcts present in the left centrum semiovale. There is also periventricular and subcortical white matter hypoattenuation most consistent with chronic microvascular ischemic change.  Other old deep white matter lacunar infarcts noted on the previous MRI are obscured in the chronic microvascular ischemic change.  There are no parenchymal masses or mass effect.  There is no evidence of a recent transcortical infarct.  There are no extra- axial masses or abnormal fluid collections.  There is no intracranial hemorrhage.  The visualized sinuses and mastoid air cells are clear.  IMPRESSION: No acute intracranial abnormalities.  Atrophy, chronic microvascular ischemic change and multiple old infarcts are stable from the prior brain MRI.  Original Report Authenticated By:    Varney Biles Chest Port 1 View  03/04/2011  *RADIOLOGY REPORT*  Clinical Data: Endotracheal tube placement.  PORTABLE CHEST - 1 VIEW  Comparison: Chest radiograph performed 03/03/2011  Findings: The patient's endotracheal tube is noted ending just above the carina, directed towards the right mainstem bronchus. This should be retracted 2-3 cm.  Lung expansion is mildly improved.  Persistent bilateral airspace opacification is seen, significantly more dense on the left.  This is essentially unchanged in appearance.  No pleural effusion or pneumothorax is seen.  The cardiomediastinal silhouette is borderline normal in size.  No acute osseous abnormalities are seen.  IMPRESSION:  1.  Endotracheal tube noted ending just above the carina, directed  towards the right mainstem bronchus.  This should be retracted 2-3 cm. 2.  Stable appearance to bilateral pneumonia, worse on the left.  Findings were discussed with Jasmine December RN in the [facility]U at 01:14 a.m. on 03/04/2011.  Original Report Authenticated By: Tonia Ghent, M.D.   Dg Chest Portable 1 View  03/03/2011  *RADIOLOGY REPORT*  Clinical Data: Altered mental status  PORTABLE CHEST - 1 VIEW  Comparison: None.  Findings: 1652 hours.  Interstitial markings are diffusely coarsened with chronic features.  There is patchy bibasilar airspace disease, left greater than right. The cardiopericardial silhouette is enlarged. Telemetry leads overlie the chest.  IMPRESSION: Left greater than right basilar airspace disease superimposed on chronic underlying interstitial coarsening.  Imaging features suggest bibasilar pneumonia with COPD.  Original Report Authenticated By: ERIC A. MANSELL, M.D.   Dg Chest Port 1v Same Day  03/03/2011  *  RADIOLOGY REPORT*  Clinical Data: Respiratory distress.  PORTABLE CHEST - 1 VIEW SAME DAY  Comparison: Chest radiograph performed earlier today at 04:52 p.m.  Findings: The lungs are mildly hypoexpanded.  There is a relatively stable appearance to dense left-sided airspace opacification, compatible with pneumonia.  Mild right-sided airspace opacity is also seen.  There is no evidence of pleural effusion or pneumothorax.  The cardiomediastinal silhouette is within normal limits.  No acute osseous abnormalities are seen.  IMPRESSION: Lungs mildly hypoexpanded; relatively stable appearance to multifocal pneumonia, relatively dense at the left lower lung zone.  Original Report Authenticated By: Tonia Ghent, M.D.   Scheduled Meds:   . acetaminophen  650 mg Rectal Once  . albuterol-ipratropium  6 puff Inhalation Q4H  . antiseptic oral rinse  1 application Mouth Rinse QID  . azithromycin  500 mg Intravenous Q24H  . chlorhexidine  15 mL Mouth/Throat BID  . enoxaparin  40 mg  Subcutaneous Q24H  . etomidate      . lidocaine (cardiac) 100 mg/4ml      . LORazepam  0.5 mg Intravenous Once  . methylPREDNISolone (SOLU-MEDROL) injection  80 mg Intravenous Q8H  .  morphine injection  2 mg Intravenous Once  . pantoprazole (PROTONIX) IV  40 mg Intravenous QHS  . piperacillin-tazobactam  3.375 g Intravenous Once  . piperacillin-tazobactam (ZOSYN)  IV  3.375 g Intravenous Q8H  . rocuronium      . sodium chloride  1,000 mL Intravenous Once  . sodium chloride  500 mL Intravenous Once  . sodium chloride  500 mL Intravenous Once  . succinylcholine      . thiamine  100 mg Intravenous Once  . vancomycin  750 mg Intravenous Q12H  . vancomycin  1,000 mg Intravenous Once  . DISCONTD: acetaminophen  650 mg Oral Once  . DISCONTD: folic acid  1 mg Oral Once  . DISCONTD: ipratropium  0.5 mg Nebulization Q4H  . DISCONTD: oseltamivir  75 mg Per Tube BID  . DISCONTD: oseltamivir  75 mg Oral BID  . DISCONTD: piperacillin-tazobactam (ZOSYN)  IV  3.375 g Intravenous Q8H   Continuous Infusions:   . sodium chloride 100 mL/hr at 03/04/11 0700  . feeding supplement (JEVITY 1.2)    . fentaNYL infusion INTRAVENOUS 115 mcg/hr (03/04/11 0800)  . midazolam (VERSED) infusion 10 mg/hr (03/04/11 0800)  . DISCONTD: propofol 79.114 mcg/kg/min (03/04/11 0442)   PRN Meds:.acetaminophen, acetaminophen, metoprolol, ondansetron (ZOFRAN) IV, ondansetron, DISCONTD: albuterol, DISCONTD: albuterol-ipratropium, DISCONTD: haloperidol lactate Assessment/Plan: Principal Problem:  *Respiratory failure Active Problems:  CAP (community acquired pneumonia)  Toxic encephalopathy  Dehydration  Rhabdomyolysis  Benign hypertension  Hypokalemia  Hyperlipidemia  GERD (gastroesophageal reflux disease)  Chronic pain  Change protonix to per G-tube. Start tube feeds. Change metered dose inhaler to nebulizer. Consult Dr. Juanetta Gosling to manage the vent. Continue IV fluids for rhabdomyolysis. PICC line for multiple  drips and antibiotics. Agree with vancomycin. Continue azithromycin and Zosyn.  Critical care time 40 minutes   LOS: 1 day   Jeorge Reister L 03/04/2011, 8:51 AM

## 2011-03-04 NOTE — Progress Notes (Signed)
2015-Temp 101.7. 2030-administered 650mg  suppository of acetominophen.2120- Re-assessed temp-101.1

## 2011-03-04 NOTE — Progress Notes (Signed)
2000-CBG check performed and blood sugar was 183. Notified Dr. Orvan Falconer. Orders placed for SSI.

## 2011-03-04 NOTE — Plan of Care (Signed)
Problem: Phase I Progression Outcomes Goal: Voiding-avoid urinary catheter unless indicated Outcome: Not Progressing Pt has foley
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
## 2011-03-04 NOTE — Progress Notes (Signed)
INITIAL ADULT NUTRITION ASSESSMENT Date: 03/04/2011   Time: 12:30 PM  Reason for Assessment: TF initiation   ASSESSMENT: Male 70 y.o.  Dx: Respiratory failure   Past Medical History  Diagnosis Date  . Hypertension   . GERD (gastroesophageal reflux disease)   . Hypercholesteremia   . Stroke     Scheduled Meds:   . acetaminophen  650 mg Rectal Once  . albuterol  2.5 mg Nebulization Q6H  . antiseptic oral rinse  1 application Mouth Rinse QID  . azithromycin  500 mg Intravenous Q24H  . chlorhexidine  15 mL Mouth/Throat BID  . enoxaparin  40 mg Subcutaneous Q24H  . etomidate      . ipratropium  0.5 mg Nebulization Q6H  . lidocaine (cardiac) 100 mg/98ml      . LORazepam  0.5 mg Intravenous Once  . methylPREDNISolone (SOLU-MEDROL) injection  80 mg Intravenous Q8H  .  morphine injection  2 mg Intravenous Once  . pantoprazole sodium  40 mg Per Tube Q1200  . piperacillin-tazobactam  3.375 g Intravenous Once  . piperacillin-tazobactam (ZOSYN)  IV  3.375 g Intravenous Q8H  . rocuronium      . sodium chloride  1,000 mL Intravenous Once  . sodium chloride  500 mL Intravenous Once  . sodium chloride  500 mL Intravenous Once  . succinylcholine      . thiamine  100 mg Intravenous Once  . vancomycin  750 mg Intravenous Q12H  . vancomycin  1,000 mg Intravenous Once  . DISCONTD: acetaminophen  650 mg Oral Once  . DISCONTD: albuterol-ipratropium  6 puff Inhalation Q4H  . DISCONTD: folic acid  1 mg Oral Once  . DISCONTD: ipratropium  0.5 mg Nebulization Q4H  . DISCONTD: oseltamivir  75 mg Per Tube BID  . DISCONTD: oseltamivir  75 mg Oral BID  . DISCONTD: pantoprazole (PROTONIX) IV  40 mg Intravenous QHS  . DISCONTD: piperacillin-tazobactam (ZOSYN)  IV  3.375 g Intravenous Q8H   Continuous Infusions:   . sodium chloride 1,000 mL (03/04/11 1141)  . feeding supplement (JEVITY 1.2) 1,000 mL (03/04/11 1114)  . fentaNYL infusion INTRAVENOUS 115 mcg/hr (03/04/11 1100)  . midazolam  (VERSED) infusion 10 mg/hr (03/04/11 0800)  . DISCONTD: propofol 79.114 mcg/kg/min (03/04/11 0442)   PRN Meds:.acetaminophen, acetaminophen, metoprolol, ondansetron (ZOFRAN) IV, ondansetron, DISCONTD: albuterol, DISCONTD: albuterol-ipratropium, DISCONTD: haloperidol lactate  Ht: 5\' 4"  (162.6 cm)  Wt: 139 lb 5.3 oz (63.2 kg)  Ideal Wt:  59.2 kg (130#) % Ideal Wt: 107%  Usual Wt: unknown   Body mass index is 23.92 kg/(m^2).  Food/Nutrition Related Hx: Pt intubated and sedated. No family present. TF started via OGT continuous Jev 1.2 at 20ml/hr adv per protocol to goal rate of 60 ml/hr which will provide 1728 kcal, 80 gr protein and 1162 ml water. Currently pt is tol TF @ 20 ml/hr. Propofol d/c.   CMP     Component Value Date/Time   NA 144 03/03/2011 1505   K 3.2* 03/03/2011 1505   CL 105 03/03/2011 1505   CO2 23 03/03/2011 1505   GLUCOSE 115* 03/03/2011 1505   BUN 57* 03/03/2011 1505   CREATININE 1.35 03/03/2011 1505   CALCIUM 9.9 03/03/2011 1505   PROT 8.4* 03/03/2011 1505   ALBUMIN 3.3* 03/03/2011 1505   AST 220* 03/03/2011 1505   ALT 78* 03/03/2011 1505   ALKPHOS 47 03/03/2011 1505   BILITOT 1.1 03/03/2011 1505   GFRNONAA 51* 03/03/2011 1505   GFRAA 59* 03/03/2011 1505  CBC    Component Value Date/Time   WBC 9.5 03/04/2011 0529   RBC 3.29* 03/04/2011 0529   HGB 10.6* 03/04/2011 0529   HCT 31.4* 03/04/2011 0529   PLT 369 03/04/2011 0529   MCV 95.4 03/04/2011 0529   MCH 32.2 03/04/2011 0529   MCHC 33.8 03/04/2011 0529   RDW 13.5 03/04/2011 0529   LYMPHSABS 0.3* 03/04/2011 0529   MONOABS 0.2 03/04/2011 0529   EOSABS 0.0 03/04/2011 0529   BASOSABS 0.0 03/04/2011 0529   CBG (last 3)  No results found for this basename: GLUCAP:3 in the last 72 hours   Intake/Output Summary (Last 24 hours) at 03/04/11 1239 Last data filed at 03/04/11 1100  Gross per 24 hour  Intake 1719.79 ml  Output   1075 ml  Net 644.79 ml     Diet Order: NPO  Supplements/Tube  Feeding: noted above  IVF:    sodium chloride Last Rate: 1,000 mL (03/04/11 1141)  feeding supplement (JEVITY 1.2) Last Rate: 1,000 mL (03/04/11 1114)  fentaNYL infusion INTRAVENOUS Last Rate: 115 mcg/hr (03/04/11 1100)  midazolam (VERSED) infusion Last Rate: 10 mg/hr (03/04/11 0800)  DISCONTD: propofol Last Rate: 79.114 mcg/kg/min (03/04/11 0442)    Estimated Nutritional Needs:   Kcal: 1610-9604 Protein: 82-94 grams Fluid: 1.6-1.8 L/d  NUTRITION DIAGNOSIS: -Inadequate oral intake (NI-2.1).  Status: Ongoing  RELATED TO: inability to eat  AS EVIDENCE BY: respiratory failure, NPO status  GOAL: Pt will meet > 90% minimum est energy and protein requirements  MONITORING: TF tol, wt change and labs  EDUCATION NEEDS: -Education not appropriate at this time   INTERVENTION: Continuous TF Jev 1.2 @ goal rate of 60 ml/hr   Dietitian [phone]  DOCUMENTATION CODES Per approved criteria  -Not Applicable    Francene Boyers 03/04/2011, 12:30 PM

## 2011-03-04 NOTE — Consult Note (Signed)
Consult requested by: Dr. Lendell Caprice Consult requested for respiratory failure:  HPI: This is a 70 year old who came to the emergency room and because of confusion. He is also very weak. He was brought to the emergency room on the 25th with weakness he was found unable to control his bowels or urine. When he was brought to the emergency room was found to have pneumonia and he was very lethargic. He eventually continued having problems and is up having to be intubated and placed on mechanical ventilation last night. He has apparently had previous strokes. It is unclear if he has any history of COPD. It is also unclear if he had anything like influenza  Past Medical History  Diagnosis Date  . Hypertension   . GERD (gastroesophageal reflux disease)   . Hypercholesteremia   . Stroke      History reviewed. No pertinent family history.   History   Social History  . Marital Status: Single    Spouse Name: N/A    Number of Children: N/A  . Years of Education: N/A   Social History Main Topics  . Smoking status: None  . Smokeless tobacco: None  . Alcohol Use: None  . Drug Use:   . Sexually Active:      Pt unable to answer at this time - altered mental status   Other Topics Concern  . None   Social History Narrative  . None     ROS: Unobtainable    Objective: Vital signs in last 24 hours: Temp:  [100.4 F (38 C)-101.7 F (38.7 C)] 100.7 F (38.2 C) (12/25 2221) Pulse Rate:  [91-118] 94  (12/25 2221) Resp:  [29-36] 30  (12/25 2221) BP: (129-178)/(57-98) 141/57 mmHg (12/25 2000) SpO2:  [89 %-100 %] 93 % (12/26 0731) FiO2 (%):  [40 %-100 %] 60 % (12/26 0733) Weight:  [63.2 kg (139 lb 5.3 oz)] 139 lb 5.3 oz (63.2 kg) (12/25 2000) Weight change:  Last BM Date: 03/03/11  Intake/Output from previous day: 12/25 0701 - 12/26 0700 In: 419.8 [I.V.:369.8; IV Piggyback:50] Out: 200 [Urine:200]  PHYSICAL EXAM He is intubated and sedated. His pupils are reactive. His blood  pressures in the high 90s systolic his pulse is about 100. His O2 saturation is in the high 90s. His chest shows rales and rhonchi bilaterally. He has some thick secretions. His heart is regular without gallop. His abdomen is soft no masses are felt bowel sounds present and active. His extremities showed no edema. Central nervous system examination shows he is sedated. He has been able to move all 4 extremities doesn't have any focal neurological abnormalities but he is sedated now  Lab Results: Basic Metabolic Panel:  Basename 03/03/11 1505  NA 144  K 3.2*  CL 105  CO2 23  GLUCOSE 115*  BUN 57*  CREATININE 1.35  CALCIUM 9.9  MG --  PHOS 1.9*   Liver Function Tests:  Basename 03/03/11 1505  AST 220*  ALT 78*  ALKPHOS 47  BILITOT 1.1  PROT 8.4*  ALBUMIN 3.3*   No results found for this basename: LIPASE:2,AMYLASE:2 in the last 72 hours No results found for this basename: AMMONIA:2 in the last 72 hours CBC:  Basename 03/04/11 0529 03/03/11 1505  WBC 9.5 12.3*  NEUTROABS 9.0* 11.7*  HGB 10.6* 12.7*  HCT 31.4* 36.6*  MCV 95.4 95.3  PLT 369 428*   Cardiac Enzymes:  Basename 03/04/11 0529 03/03/11 1505  CKTOTAL 4155* 6643*  CKMB 6.7* 9.0*  CKMBINDEX -- --  TROPONINI 0.74* 0.40*   BNP: No results found for this basename: PROBNP:3 in the last 72 hours D-Dimer: No results found for this basename: DDIMER:2 in the last 72 hours CBG: No results found for this basename: GLUCAP:6 in the last 72 hours Hemoglobin A1C: No results found for this basename: HGBA1C in the last 72 hours Fasting Lipid Panel: No results found for this basename: CHOL,HDL,LDLCALC,TRIG,CHOLHDL,LDLDIRECT in the last 72 hours Thyroid Function Tests:  Basename 03/03/11 1505  TSH 0.872  T4TOTAL --  FREET4 --  T3FREE --  THYROIDAB --   Anemia Panel: No results found for this basename: VITAMINB12,FOLATE,FERRITIN,TIBC,IRON,RETICCTPCT in the last 72 hours Coagulation:  Basename 03/03/11 1505    LABPROT 16.2*  INR 1.27   Urine Drug Screen: Drugs of Abuse  No results found for this basename: labopia, cocainscrnur, labbenz, amphetmu, thcu, labbarb    Alcohol Level:  Basename 03/03/11 1505  ETH <11   Urinalysis:  Misc. Labs:   ABGS:  Basename 03/04/11 0305  PHART 7.435  PO2ART 81.6  TCO2 15.2  HCO3 18.6*     MICROBIOLOGY: Recent Results (from the past 240 hour(s))  CULTURE, BLOOD (ROUTINE X 2)     Status: Normal (Preliminary result)   Collection Time   03/03/11  3:05 PM      Component Value Range Status Comment   Specimen Description BLOOD RIGHT ANTECUBITAL   Final    Special Requests     Final    Value: BOTTLES DRAWN AEROBIC AND ANAEROBIC 6CC EACH BOTTLE   Culture PENDING   Incomplete    Report Status PENDING   Incomplete   CULTURE, BLOOD (ROUTINE X 2)     Status: Normal (Preliminary result)   Collection Time   03/03/11  4:31 PM      Component Value Range Status Comment   Specimen Description BLOOD RIGHT HAND   Final    Special Requests BOTTLES DRAWN AEROBIC ONLY 6CC BOTTLE   Final    Culture PENDING   Incomplete    Report Status PENDING   Incomplete   MRSA PCR SCREENING     Status: Normal   Collection Time   03/03/11  6:35 PM      Component Value Range Status Comment   MRSA by PCR NEGATIVE  NEGATIVE  Final     Studies/Results: Ct Head Wo Contrast  03/03/2011  *RADIOLOGY REPORT*  Clinical Data: Altered mental status.  Patient found unresponsive and unable to follow commands.  CT HEAD WITHOUT CONTRAST  Technique:  Contiguous axial images were obtained from the base of the skull through the vertex without contrast.  Comparison: Brain MRI, 12/11/2009  Findings: The ventricles are normal in overall configuration. There is ventricular and sulcal enlargement reflecting mild atrophy.  Encephalomalacia involves most of the occipital lobes bilaterally extending to the inferior parietal lobes.  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 Other old left basal ganglier lacunar infarcts are noted with additional lacunar infarcts present in the left centrum semiovale. There is also periventricular and subcortical white matter hypoattenuation most consistent with chronic microvascular ischemic change.  Other old deep white matter lacunar infarcts noted on the previous MRI are obscured in the chronic microvascular ischemic change.  There are no parenchymal masses or mass effect.  There is no evidence of a recent transcortical infarct.  There are no extra- axial masses or abnormal fluid collections.  There is no intracranial hemorrhage.  The visualized sinuses and mastoid air cells are clear.  IMPRESSION:  No acute intracranial abnormalities.  Atrophy, chronic microvascular ischemic change and multiple old infarcts are stable from the prior brain MRI.  Original Report Authenticated By:    Varney Biles Chest Port 1 View  03/04/2011  *RADIOLOGY REPORT*  Clinical Data: Endotracheal tube placement.  PORTABLE CHEST - 1 VIEW  Comparison: Chest radiograph performed 03/03/2011  Findings: The patient's endotracheal tube is noted ending just above the carina, directed towards the right mainstem bronchus. This should be retracted 2-3 cm.  Lung expansion is mildly improved.  Persistent bilateral airspace opacification is seen, significantly more dense on the left.  This is essentially unchanged in appearance.  No pleural effusion or pneumothorax is seen.  The cardiomediastinal silhouette is borderline normal in size.  No acute osseous abnormalities are seen.  IMPRESSION:  1.  Endotracheal tube noted ending just above the carina, directed towards the right mainstem bronchus.  This should be retracted 2-3 cm. 2.  Stable appearance to bilateral pneumonia, worse on the left.  Findings were discussed with Jasmine December RN in the [facility] at 01:14 a.m. on 03/04/2011.  Original Report Authenticated By: Tonia Ghent, M.D.   Dg Chest Portable 1 View  03/03/2011   *RADIOLOGY REPORT*  Clinical Data: Altered mental status  PORTABLE CHEST - 1 VIEW  Comparison: None.  Findings: 1652 hours.  Interstitial markings are diffusely coarsened with chronic features.  There is patchy bibasilar airspace disease, left greater than right. The cardiopericardial silhouette is enlarged. Telemetry leads overlie the chest.  IMPRESSION: Left greater than right basilar airspace disease superimposed on chronic underlying interstitial coarsening.  Imaging features suggest bibasilar pneumonia with COPD.  Original Report Authenticated By: ERIC A. MANSELL, M.D.   Dg Chest Port 1v Same Day  03/03/2011  *RADIOLOGY REPORT*  Clinical Data: Respiratory distress.  PORTABLE CHEST - 1 VIEW SAME DAY  Comparison: Chest radiograph performed earlier today at 04:52 p.m.  Findings: The lungs are mildly hypoexpanded.  There is a relatively stable appearance to dense left-sided airspace opacification, compatible with pneumonia.  Mild right-sided airspace opacity is also seen.  There is no evidence of pleural effusion or pneumothorax.  The cardiomediastinal silhouette is within normal limits.  No acute osseous abnormalities are seen.  IMPRESSION: Lungs mildly hypoexpanded; relatively stable appearance to multifocal pneumonia, relatively dense at the left lower lung zone.  Original Report Authenticated By: Tonia Ghent, M.D.    Medications:  Scheduled:   . acetaminophen  650 mg Rectal Once  . albuterol-ipratropium  6 puff Inhalation Q4H  . antiseptic oral rinse  1 application Mouth Rinse QID  . azithromycin  500 mg Intravenous Q24H  . chlorhexidine  15 mL Mouth/Throat BID  . enoxaparin  40 mg Subcutaneous Q24H  . etomidate      . lidocaine (cardiac) 100 mg/62ml      . LORazepam  0.5 mg Intravenous Once  . methylPREDNISolone (SOLU-MEDROL) injection  80 mg Intravenous Q8H  .  morphine injection  2 mg Intravenous Once  . pantoprazole (PROTONIX) IV  40 mg Intravenous QHS  . piperacillin-tazobactam   3.375 g Intravenous Once  . piperacillin-tazobactam (ZOSYN)  IV  3.375 g Intravenous Q8H  . rocuronium      . sodium chloride  1,000 mL Intravenous Once  . sodium chloride  500 mL Intravenous Once  . sodium chloride  500 mL Intravenous Once  . succinylcholine      . thiamine  100 mg Intravenous Once  . DISCONTD: acetaminophen  650 mg Oral Once  . DISCONTD:  folic acid  1 mg Oral Once  . DISCONTD: ipratropium  0.5 mg Nebulization Q4H  . DISCONTD: oseltamivir  75 mg Per Tube BID  . DISCONTD: oseltamivir  75 mg Oral BID  . DISCONTD: piperacillin-tazobactam (ZOSYN)  IV  3.375 g Intravenous Q8H   Continuous:   . sodium chloride 100 mL/hr at 03/04/11 0300  . fentaNYL infusion INTRAVENOUS 50 mcg/hr (03/04/11 0610)  . midazolam (VERSED) infusion 10 mg/hr (03/04/11 0657)  . DISCONTD: propofol 79.114 mcg/kg/min (03/04/11 0442)   EAV:[id number], acetaminophen, albuterol-ipratropium, metoprolol, ondansetron (ZOFRAN) IV, ondansetron, DISCONTD: albuterol, DISCONTD: haloperidol lactate  Assesment: He has community-acquired pneumonia but it is multifocal so I think we should add vancomycin to the mix. He has multiple other medical problems. He has elevation of his CPK but this is felt to be secondary to rhabdomyolysis. He has had a previous stroke by history. He appears to have a toxic encephalopathy. He is not ready for any weaning at this point. Principal Problem:  *CAP (community acquired pneumonia) Active Problems:  Toxic encephalopathy  Dehydration  Rhabdomyolysis  Benign hypertension  Hyperlipidemia  GERD (gastroesophageal reflux disease)  Chronic pain    Plan: Continue with current treatments he started on inhaled bronchodilators IV antibiotics steroids but I will add vancomycin .    LOS: 1 day   Julie Nay L 03/04/2011, 8:29 AM

## 2011-03-04 NOTE — Consult Note (Signed)
ANTIBIOTIC CONSULT NOTE - INITIAL  Pharmacy Consult for Vancomycin Indication: pneumonia  No Known Allergies  Patient Measurements: Height: 5\' 4"  (162.6 cm) Weight: 139 lb 5.3 oz (63.2 kg) IBW/kg (Calculated) : 59.2   Vital Signs: Temp: 100.4 F (38 C) (12/26 0400) BP: 128/100 mmHg (12/26 0700) Pulse Rate: 93  (12/26 0700) Intake/Output from previous day: 12/25 0701 - 12/26 0700 In: 419.8 [I.V.:369.8; IV Piggyback:50] Out: 200 [Urine:200] Intake/Output from this shift:    Labs:  [facility] 03/04/11 0529 03/03/11 1505  WBC 9.5 12.3*  HGB 10.6* 12.7*  PLT 369 428*  LABCREA -- --  CREATININE -- 1.35   Estimated Creatinine Clearance: 41.4 ml/min (by C-G formula based on Cr of 1.35). No results found for this basename: VANCOTROUGH:2,VANCOPEAK:2,VANCORANDOM:2,GENTTROUGH:2,GENTPEAK:2,GENTRANDOM:2,TOBRATROUGH:2,TOBRAPEAK:2,TOBRARND:2,AMIKACINPEAK:2,AMIKACINTROU:2,AMIKACIN:2, in the last 72 hours   Microbiology: Recent Results (from the past 720 hour(s))  CULTURE, BLOOD (ROUTINE X 2)     Status: Normal (Preliminary result)   Collection Time   03/03/11  3:05 PM      Component Value Range Status Comment   Specimen Description BLOOD RIGHT ANTECUBITAL   Final    Special Requests     Final    Value: BOTTLES DRAWN AEROBIC AND ANAEROBIC 6CC EACH BOTTLE   Culture PENDING   Incomplete    Report Status PENDING   Incomplete   CULTURE, BLOOD (ROUTINE X 2)     Status: Normal (Preliminary result)   Collection Time   03/03/11  4:31 PM      Component Value Range Status Comment   Specimen Description BLOOD RIGHT HAND   Final    Special Requests BOTTLES DRAWN AEROBIC ONLY 6CC BOTTLE   Final    Culture PENDING   Incomplete    Report Status PENDING   Incomplete   MRSA PCR SCREENING     Status: Normal   Collection Time   03/03/11  6:35 PM      Component Value Range Status Comment   MRSA by PCR NEGATIVE  NEGATIVE  Final     Medical History: Past Medical History  Diagnosis Date  .  Hypertension   . GERD (gastroesophageal reflux disease)   . Hypercholesteremia   . Stroke    Medications:  Scheduled:    . acetaminophen  650 mg Rectal Once  . albuterol-ipratropium  6 puff Inhalation Q4H  . antiseptic oral rinse  1 application Mouth Rinse QID  . azithromycin  500 mg Intravenous Q24H  . chlorhexidine  15 mL Mouth/Throat BID  . enoxaparin  40 mg Subcutaneous Q24H  . etomidate      . lidocaine (cardiac) 100 mg/53ml      . LORazepam  0.5 mg Intravenous Once  . methylPREDNISolone (SOLU-MEDROL) injection  80 mg Intravenous Q8H  .  morphine injection  2 mg Intravenous Once  . pantoprazole (PROTONIX) IV  40 mg Intravenous QHS  . piperacillin-tazobactam  3.375 g Intravenous Once  . piperacillin-tazobactam (ZOSYN)  IV  3.375 g Intravenous Q8H  . rocuronium      . sodium chloride  1,000 mL Intravenous Once  . sodium chloride  500 mL Intravenous Once  . sodium chloride  500 mL Intravenous Once  . succinylcholine      . thiamine  100 mg Intravenous Once  . vancomycin  750 mg Intravenous Q12H  . vancomycin  1,000 mg Intravenous Once  . DISCONTD: acetaminophen  650 mg Oral Once  . DISCONTD: folic acid  1 mg Oral Once  . DISCONTD: ipratropium  0.5 mg Nebulization  Q4H  . DISCONTD: oseltamivir  75 mg Per Tube BID  . DISCONTD: oseltamivir  75 mg Oral BID  . DISCONTD: piperacillin-tazobactam (ZOSYN)  IV  3.375 g Intravenous Q8H   Assessment: clcr < 45  Goal of Therapy:  Vancomycin trough level 15-20 mcg/ml  Plan: Vancomycin 1gm x 1 then 750mg  iv q12hrs Check trough at steady state Labs per protocol  Valrie Hart A 03/04/2011,8:34 AM

## 2011-03-04 NOTE — ED Notes (Signed)
Called to intensive care unit to intubate patient.  He has long-standing COPD and pneumonia. He was oxygenating poorly on BiPAP. Patient intubated using RSI side technique with etomidate 15 mg and succinylcholine 125 mg using 7.5 endotracheal tube.  intubated without difficulty.  Since intubation breath sounds were equal bilaterally. Pulse ox registered high 90%.  CRITICAL CARE Performed by: Donnetta Hutching  ?  Total critical care time: 30  Critical care time was exclusive of separately billable procedures and treating other patients.  Critical care was necessary to treat or prevent imminent or life-threatening deterioration.  Critical care was time spent personally by me on the following activities: development of treatment plan with patient and/or surrogate as well as nursing, discussions with consultants, evaluation of patient's response to treatment, examination of patient, obtaining history from patient or surrogate, ordering and performing treatments and interventions, ordering and review of laboratory studies, ordering and review of radiographic studies, pulse oximetry and re-evaluation of patient's condition.  Donnetta Hutching, MD 03/04/11 Lyda Jester

## 2011-03-04 NOTE — Plan of Care (Signed)
Problem: Phase I Progression Outcomes Goal: Consider pulmonary consult Outcome: Completed/Met Date Met:  03/04/11 Dr. Juanetta Gosling assessed pt on 03-04-11.

## 2011-03-05 ENCOUNTER — Encounter (HOSPITAL_COMMUNITY): Payer: Medicare Other

## 2011-03-05 ENCOUNTER — Other Ambulatory Visit (HOSPITAL_COMMUNITY): Payer: Medicare Other

## 2011-03-05 ENCOUNTER — Inpatient Hospital Stay (HOSPITAL_COMMUNITY): Payer: Medicare Other

## 2011-03-05 LAB — BLOOD GAS, ARTERIAL
Acid-base deficit: 2 mmol/L (ref 0.0–2.0)
FIO2: 60 %
TCO2: 20.6 mmol/L (ref 0–100)
pCO2 arterial: 32.6 mmHg — ABNORMAL LOW (ref 35.0–45.0)
pH, Arterial: 7.436 (ref 7.350–7.450)
pO2, Arterial: 83.7 mmHg (ref 80.0–100.0)

## 2011-03-05 LAB — GLUCOSE, CAPILLARY
Glucose-Capillary: 173 mg/dL — ABNORMAL HIGH (ref 70–99)
Glucose-Capillary: 191 mg/dL — ABNORMAL HIGH (ref 70–99)
Glucose-Capillary: 274 mg/dL — ABNORMAL HIGH (ref 70–99)

## 2011-03-05 LAB — BASIC METABOLIC PANEL
CO2: 23 mEq/L (ref 19–32)
GFR calc non Af Amer: 64 mL/min — ABNORMAL LOW (ref >90)
Glucose, Bld: 233 mg/dL — ABNORMAL HIGH (ref 70–99)
Potassium: 2.4 mEq/L — CL (ref 3.5–5.1)
Sodium: 151 mEq/L — ABNORMAL HIGH (ref 135–145)

## 2011-03-05 LAB — CK: Total CK: 1768 U/L — ABNORMAL HIGH (ref 7–232)

## 2011-03-05 MED ORDER — SODIUM CHLORIDE 0.9 % IJ SOLN
INTRAMUSCULAR | Status: AC
  Administered 2011-03-05: 10 mL
  Filled 2011-03-05: qty 3

## 2011-03-05 MED ORDER — POTASSIUM CHLORIDE 10 MEQ/100ML IV SOLN
INTRAVENOUS | Status: AC
  Administered 2011-03-05: 10 meq via INTRAVENOUS
  Filled 2011-03-05: qty 100

## 2011-03-05 MED ORDER — DEXTROSE 5 % IV SOLN
INTRAVENOUS | Status: DC
  Administered 2011-03-05: 09:00:00 via INTRAVENOUS

## 2011-03-05 MED ORDER — MIDAZOLAM HCL 50 MG/10ML IJ SOLN
INTRAMUSCULAR | Status: AC
  Filled 2011-03-05: qty 1

## 2011-03-05 MED ORDER — POTASSIUM CHLORIDE 20 MEQ/15ML (10%) PO LIQD
40.0000 meq | ORAL | Status: AC
  Administered 2011-03-05 (×3): 40 meq via ORAL
  Filled 2011-03-05 (×2): qty 30

## 2011-03-05 MED ORDER — INSULIN GLARGINE 100 UNIT/ML ~~LOC~~ SOLN
5.0000 [IU] | Freq: Every day | SUBCUTANEOUS | Status: DC
  Administered 2011-03-05: 5 [IU] via SUBCUTANEOUS

## 2011-03-05 MED ORDER — POTASSIUM CHLORIDE 20 MEQ/15ML (10%) PO SOLN
ORAL | Status: AC
  Administered 2011-03-05: 40 meq via ORAL
  Filled 2011-03-05: qty 30

## 2011-03-05 MED ORDER — SODIUM CHLORIDE 0.9 % IJ SOLN
INTRAMUSCULAR | Status: AC
  Administered 2011-03-05: 11:00:00
  Filled 2011-03-05: qty 3

## 2011-03-05 MED ORDER — SODIUM CHLORIDE 0.9 % IJ SOLN
10.0000 mL | Freq: Two times a day (BID) | INTRAMUSCULAR | Status: DC
  Administered 2011-03-05 – 2011-03-26 (×30): 10 mL
  Filled 2011-03-05 (×15): qty 3
  Filled 2011-03-05: qty 6
  Filled 2011-03-05 (×3): qty 3
  Filled 2011-03-05 (×2): qty 6
  Filled 2011-03-05 (×3): qty 3

## 2011-03-05 MED ORDER — INSULIN GLARGINE 100 UNIT/ML ~~LOC~~ SOLN
SUBCUTANEOUS | Status: AC
  Filled 2011-03-05: qty 3

## 2011-03-05 MED ORDER — POTASSIUM CHLORIDE 10 MEQ/100ML IV SOLN
10.0000 meq | INTRAVENOUS | Status: AC
  Administered 2011-03-05 (×5): 10 meq via INTRAVENOUS
  Filled 2011-03-05: qty 400

## 2011-03-05 MED ORDER — SODIUM CHLORIDE 0.9 % IJ SOLN
10.0000 mL | INTRAMUSCULAR | Status: DC | PRN
  Administered 2011-03-16: 10 mL
  Filled 2011-03-05 (×2): qty 3
  Filled 2011-03-05: qty 6
  Filled 2011-03-05 (×3): qty 3

## 2011-03-05 NOTE — Progress Notes (Signed)
Subjective: He remains intubated and sedated. He had a much better night as far as sedation is concerned. He has low potassium that is being treated.  Objective: Vital signs in last 24 hours: Temp:  [98.2 F (36.8 C)-101.7 F (38.7 C)] 99.2 F (37.3 C) (12/27 0400) Pulse Rate:  [64-86] 86  (12/27 0700) Resp:  [13-23] 17  (12/27 0700) BP: (84-159)/(40-69) 159/69 mmHg (12/27 0700) SpO2:  [91 %-100 %] 98 % (12/27 0700) FiO2 (%):  [59.3 %-100 %] 100 % (12/27 0700) Weight change:  Last BM Date: 03/04/11  Intake/Output from previous day: 12/26 0701 - 12/27 0700 In: 3875 [I.V.:2205; NG/GT:1070; IV Piggyback:600] Out: 1150 [Urine:1150]  PHYSICAL EXAM General appearance: no distress Resp: rhonchi bilaterally Cardio: regular rate and rhythm, S1, S2 normal, no murmur, click, rub or gallop GI: soft, non-tender; bowel sounds normal; no masses,  no organomegaly Extremities: extremities normal, atraumatic, no cyanosis or edema  Lab Results:    Basic Metabolic Panel:  Basename 03/05/11 0437 03/03/11 1505  NA 151* 144  K 2.4* 3.2*  CL 117* 105  CO2 23 23  GLUCOSE 233* 115*  BUN 37* 57*  CREATININE 1.12 1.35  CALCIUM 8.7 9.9  MG -- --  PHOS -- 1.9*   Liver Function Tests:  Basename 03/03/11 1505  AST 220*  ALT 78*  ALKPHOS 47  BILITOT 1.1  PROT 8.4*  ALBUMIN 3.3*   No results found for this basename: LIPASE:2,AMYLASE:2 in the last 72 hours No results found for this basename: AMMONIA:2 in the last 72 hours CBC:  Basename 03/04/11 0529 03/03/11 1505  WBC 9.5 12.3*  NEUTROABS 9.0* 11.7*  HGB 10.6* 12.7*  HCT 31.4* 36.6*  MCV 95.4 95.3  PLT 369 428*   Cardiac Enzymes:  Basename 03/05/11 0437 03/04/11 1146 03/04/11 0529 03/03/11 1505  CKTOTAL 1768* 3134* 4155* --  CKMB -- 6.0* 6.7* 9.0*  CKMBINDEX -- -- -- --  TROPONINI -- 1.03* 0.74* 0.40*   BNP: No results found for this basename: PROBNP:3 in the last 72 hours D-Dimer: No results found for this basename:  DDIMER:2 in the last 72 hours CBG:  Basename 03/05/11 0357 03/05/11 0007 03/04/11 2008  GLUCAP 191* 173* 183*   Hemoglobin A1C: No results found for this basename: HGBA1C in the last 72 hours Fasting Lipid Panel: No results found for this basename: CHOL,HDL,LDLCALC,TRIG,CHOLHDL,LDLDIRECT in the last 72 hours Thyroid Function Tests:  Basename 03/03/11 1505  TSH 0.872  T4TOTAL --  FREET4 --  T3FREE --  THYROIDAB --   Anemia Panel: No results found for this basename: VITAMINB12,FOLATE,FERRITIN,TIBC,IRON,RETICCTPCT in the last 72 hours Coagulation:  Basename 03/03/11 1505  LABPROT 16.2*  INR 1.27   Urine Drug Screen: Drugs of Abuse  No results found for this basename: labopia, cocainscrnur, labbenz, amphetmu, thcu, labbarb    Alcohol Level:  Basename 03/03/11 1505  ETH <11   Urinalysis:  Misc. Labs:  ABGS  Basename 03/05/11 0430  PHART 7.436  PO2ART 83.7  TCO2 20.6  HCO3 21.6   CULTURES Recent Results (from the past 240 hour(s))  CULTURE, BLOOD (ROUTINE X 2)     Status: Normal (Preliminary result)   Collection Time   03/03/11  3:05 PM      Component Value Range Status Comment   Specimen Description BLOOD RIGHT ANTECUBITAL   Final    Special Requests     Final    Value: BOTTLES DRAWN AEROBIC AND ANAEROBIC 6CC EACH BOTTLE   Culture NO GROWTH 1 DAY  Final    Report Status PENDING   Incomplete   CULTURE, BLOOD (ROUTINE X 2)     Status: Normal (Preliminary result)   Collection Time   03/03/11  4:31 PM      Component Value Range Status Comment   Specimen Description BLOOD RIGHT HAND   Final    Special Requests BOTTLES DRAWN AEROBIC ONLY 6CC BOTTLE   Final    Culture NO GROWTH 1 DAY   Final    Report Status PENDING   Incomplete   MRSA PCR SCREENING     Status: Normal   Collection Time   03/03/11  6:35 PM      Component Value Range Status Comment   MRSA by PCR NEGATIVE  NEGATIVE  Final    Studies/Results: Ct Head Wo Contrast  03/03/2011  *RADIOLOGY  REPORT*  Clinical Data: Altered mental status.  Patient found unresponsive and unable to follow commands.  CT HEAD WITHOUT CONTRAST  Technique:  Contiguous axial images were obtained from the base of the skull through the vertex without contrast.  Comparison: Brain MRI, 12/11/2009  Findings: The ventricles are normal in overall configuration. There is ventricular and sulcal enlargement reflecting mild atrophy.  Encephalomalacia involves most of the occipital lobes bilaterally extending to the inferior parietal lobes.  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 Other old left basal ganglier lacunar infarcts are noted with additional lacunar infarcts present in the left centrum semiovale. There is also periventricular and subcortical white matter hypoattenuation most consistent with chronic microvascular ischemic change.  Other old deep white matter lacunar infarcts noted on the previous MRI are obscured in the chronic microvascular ischemic change.  There are no parenchymal masses or mass effect.  There is no evidence of a recent transcortical infarct.  There are no extra- axial masses or abnormal fluid collections.  There is no intracranial hemorrhage.  The visualized sinuses and mastoid air cells are clear.  IMPRESSION: No acute intracranial abnormalities.  Atrophy, chronic microvascular ischemic change and multiple old infarcts are stable from the prior brain MRI.  Original Report Authenticated By:    Varney Biles Chest Port 1 View  03/05/2011  *RADIOLOGY REPORT*  Clinical Data: Respiratory failure.  PORTABLE CHEST - 1 VIEW  Comparison: Chest radiograph performed 03/04/2011  Findings: The patient's endotracheal tube is seen ending 4 cm above the carina.  An enteric tube is noted ending at the body of the stomach, with its sideport at the fundus of the stomach.  There has been interval worsening in right basilar airspace opacification, with persistent left-sided airspace disease, compatible  with mildly worsening multifocal pneumonia.  No definite pleural effusion or pneumothorax is seen.  The cardiomediastinal silhouette is mildly enlarged.  No acute osseous abnormalities are identified.  IMPRESSION:  1.  Interval worsening in right basilar airspace disease, with persistent more diffuse left-sided airspace, compatible with mildly worsening multifocal pneumonia. 2.  Mild cardiomegaly.  Original Report Authenticated By: Tonia Ghent, M.D.   Dg Chest Port 1 View  03/04/2011  *RADIOLOGY REPORT*  Clinical Data: Endotracheal tube placement.  PORTABLE CHEST - 1 VIEW  Comparison: Chest radiograph performed 03/03/2011  Findings: The patient's endotracheal tube is noted ending just above the carina, directed towards the right mainstem bronchus. This should be retracted 2-3 cm.  Lung expansion is mildly improved.  Persistent bilateral airspace opacification is seen, significantly more dense on the left.  This is essentially unchanged in appearance.  No pleural effusion or pneumothorax is seen.  The cardiomediastinal silhouette is borderline normal in size.  No acute osseous abnormalities are seen.  IMPRESSION:  1.  Endotracheal tube noted ending just above the carina, directed towards the right mainstem bronchus.  This should be retracted 2-3 cm. 2.  Stable appearance to bilateral pneumonia, worse on the left.  Findings were discussed with Jasmine December RN in the [facility]U at 01:14 a.m. on 03/04/2011.  Original Report Authenticated By: Tonia Ghent, M.D.   Dg Chest Portable 1 View  03/03/2011  *RADIOLOGY REPORT*  Clinical Data: Altered mental status  PORTABLE CHEST - 1 VIEW  Comparison: None.  Findings: 1652 hours.  Interstitial markings are diffusely coarsened with chronic features.  There is patchy bibasilar airspace disease, left greater than right. The cardiopericardial silhouette is enlarged. Telemetry leads overlie the chest.  IMPRESSION: Left greater than right basilar airspace disease  superimposed on chronic underlying interstitial coarsening.  Imaging features suggest bibasilar pneumonia with COPD.  Original Report Authenticated By: ERIC A. MANSELL, M.D.   Dg Chest Port 1v Same Day  03/03/2011  *RADIOLOGY REPORT*  Clinical Data: Respiratory distress.  PORTABLE CHEST - 1 VIEW SAME DAY  Comparison: Chest radiograph performed earlier today at 04:52 p.m.  Findings: The lungs are mildly hypoexpanded.  There is a relatively stable appearance to dense left-sided airspace opacification, compatible with pneumonia.  Mild right-sided airspace opacity is also seen.  There is no evidence of pleural effusion or pneumothorax.  The cardiomediastinal silhouette is within normal limits.  No acute osseous abnormalities are seen.  IMPRESSION: Lungs mildly hypoexpanded; relatively stable appearance to multifocal pneumonia, relatively dense at the left lower lung zone.  Original Report Authenticated By: Tonia Ghent, M.D.    Medications:  Scheduled:   . albuterol  2.5 mg Nebulization Q6H  . antiseptic oral rinse  1 application Mouth Rinse QID  . azithromycin  500 mg Intravenous Q24H  . chlorhexidine  15 mL Mouth/Throat BID  . enoxaparin  40 mg Subcutaneous Q24H  . insulin aspart  0-9 Units Subcutaneous Q4H  . ipratropium  0.5 mg Nebulization Q6H  . lidocaine (cardiac) 100 mg/57ml      . methylPREDNISolone (SOLU-MEDROL) injection  80 mg Intravenous Q8H  . pantoprazole sodium  40 mg Per Tube Q1200  . piperacillin-tazobactam (ZOSYN)  IV  3.375 g Intravenous Q8H  . potassium chloride  10 mEq Intravenous Q1 Hr x 5  . potassium chloride  40 mEq Oral Q4H  . rocuronium      . vancomycin  750 mg Intravenous Q12H  . vancomycin  1,000 mg Intravenous Once  . DISCONTD: albuterol-ipratropium  6 puff Inhalation Q4H  . DISCONTD: pantoprazole (PROTONIX) IV  40 mg Intravenous QHS  . DISCONTD: piperacillin-tazobactam (ZOSYN)  IV  3.375 g Intravenous Q8H   Continuous:   . sodium chloride 75 mL/hr at  03/05/11 0600  . feeding supplement (JEVITY 1.2) 1,000 mL (03/04/11 1114)  . fentaNYL infusion INTRAVENOUS 50 mcg/hr (03/05/11 0600)  . midazolam (VERSED) infusion 10 mg/hr (03/04/11 1400)   ZOX:[id number], acetaminophen, metoprolol, ondansetron (ZOFRAN) IV, ondansetron, DISCONTD: albuterol-ipratropium  Assesment: He generally seems a little better. He has some significant electrolyte abnormalities are being treated. Although he still has rhonchi on his chest exam is clearer than yesterday. His x-ray looks a little bit worse. Principal Problem:  *Respiratory failure Active Problems:  CAP (community acquired pneumonia)  Toxic encephalopathy  Dehydration  Rhabdomyolysis  Benign hypertension  Hyperlipidemia  GERD (gastroesophageal reflux disease)  Chronic pain  Hypokalemia  Plan: He will attempt weaning today.    LOS: 2 days   Lourene Hoston L 03/05/2011, 7:25 AM

## 2011-03-05 NOTE — Consult Note (Signed)
ANTIBIOTIC CONSULT NOTE   Pharmacy Consult for Vancomycin Indication: pneumonia  No Known Allergies  Patient Measurements: Height: 5\' 4"  (162.6 cm) Weight: 139 lb 5.3 oz (63.2 kg) IBW/kg (Calculated) : 59.2   Vital Signs: Temp: 99.2 F (37.3 C) (12/27 0400) Temp src: Axillary (12/27 0400) BP: 154/56 mmHg (12/27 0800) Pulse Rate: 94  (12/27 0800) Intake/Output from previous day: 12/26 0701 - 12/27 0700 In: 4115 [I.V.:2285; NG/GT:1130; IV Piggyback:700] Out: 1150 [Urine:1150] Intake/Output from this shift: Total I/O In: 176.3 [I.V.:76.3; IV Piggyback:100] Out: -   Labs:  Basename 03/05/11 0437 03/04/11 0529 03/03/11 1505  WBC -- 9.5 12.3*  HGB -- 10.6* 12.7*  PLT -- 369 428*  LABCREA -- -- --  CREATININE 1.12 -- 1.35   Estimated Creatinine Clearance: 49.9 ml/min (by C-G formula based on Cr of 1.12). No results found for this basename: VANCOTROUGH:2,VANCOPEAK:2,VANCORANDOM:2,GENTTROUGH:2,GENTPEAK:2,GENTRANDOM:2,TOBRATROUGH:2,TOBRAPEAK:2,TOBRARND:2,AMIKACINPEAK:2,AMIKACINTROU:2,AMIKACIN:2, in the last 72 hours   Microbiology: Recent Results (from the past 720 hour(s))  CULTURE, BLOOD (ROUTINE X 2)     Status: Normal (Preliminary result)   Collection Time   03/03/11  3:05 PM      Component Value Range Status Comment   Specimen Description BLOOD RIGHT ANTECUBITAL   Final    Special Requests     Final    Value: BOTTLES DRAWN AEROBIC AND ANAEROBIC 6CC EACH BOTTLE   Culture NO GROWTH 1 DAY   Final    Report Status PENDING   Incomplete   CULTURE, BLOOD (ROUTINE X 2)     Status: Normal (Preliminary result)   Collection Time   03/03/11  4:31 PM      Component Value Range Status Comment   Specimen Description BLOOD RIGHT HAND   Final    Special Requests BOTTLES DRAWN AEROBIC ONLY 6CC BOTTLE   Final    Culture NO GROWTH 1 DAY   Final    Report Status PENDING   Incomplete   MRSA PCR SCREENING     Status: Normal   Collection Time   03/03/11  6:35 PM      Component Value  Range Status Comment   MRSA by PCR NEGATIVE  NEGATIVE  Final     Medical History: Past Medical History  Diagnosis Date  . Hypertension   . GERD (gastroesophageal reflux disease)   . Hypercholesteremia   . Stroke    Medications:  Scheduled:     . albuterol  2.5 mg Nebulization Q6H  . antiseptic oral rinse  1 application Mouth Rinse QID  . azithromycin  500 mg Intravenous Q24H  . chlorhexidine  15 mL Mouth/Throat BID  . enoxaparin  40 mg Subcutaneous Q24H  . insulin aspart  0-9 Units Subcutaneous Q4H  . insulin glargine  5 Units Subcutaneous Daily  . ipratropium  0.5 mg Nebulization Q6H  . lidocaine (cardiac) 100 mg/96ml      . methylPREDNISolone (SOLU-MEDROL) injection  80 mg Intravenous Q8H  . pantoprazole sodium  40 mg Per Tube Q1200  . piperacillin-tazobactam (ZOSYN)  IV  3.375 g Intravenous Q8H  . potassium chloride  10 mEq Intravenous Q1 Hr x 5  . potassium chloride  40 mEq Oral Q4H  . rocuronium      . vancomycin  750 mg Intravenous Q12H  . vancomycin  1,000 mg Intravenous Once   Assessment: SCr slightly improved, renal fxn OK  Goal of Therapy:  Vancomycin trough level 15-20 mcg/ml  Plan: Vancomycin 1gm x 1 then 750mg  iv q12hrs Check trough Friday am Labs per protocol  Margo Aye, Mykel Sponaugle A 03/05/2011,9:46 AM

## 2011-03-05 NOTE — Progress Notes (Signed)
Nutrition Follow-up  Pt failed weaning attempt earlier this am. He is tol continuous TF of Jev 1.2 @ goal rate of 60 ml/hr; which is adequate to meet >90% of est nutr needs. Hypernatremia, Hypokalemia, Hyperglycemia noted.  Diet Order: NPO  Meds: Scheduled Meds:   . albuterol  2.5 mg Nebulization Q6H  . antiseptic oral rinse  1 application Mouth Rinse QID  . azithromycin  500 mg Intravenous Q24H  . chlorhexidine  15 mL Mouth/Throat BID  . enoxaparin  40 mg Subcutaneous Q24H  . insulin aspart  0-9 Units Subcutaneous Q4H  . insulin glargine      . insulin glargine  5 Units Subcutaneous Daily  . ipratropium  0.5 mg Nebulization Q6H  . lidocaine (cardiac) 100 mg/61ml      . methylPREDNISolone (SOLU-MEDROL) injection  80 mg Intravenous Q8H  . pantoprazole sodium  40 mg Per Tube Q1200  . piperacillin-tazobactam (ZOSYN)  IV  3.375 g Intravenous Q8H  . potassium chloride  10 mEq Intravenous Q1 Hr x 5  . potassium chloride  40 mEq Oral Q4H  . rocuronium      . sodium chloride  10 mL Intracatheter Q12H  . sodium chloride      . vancomycin  750 mg Intravenous Q12H   Continuous Infusions:   . dextrose    . feeding supplement (JEVITY 1.2) 1,000 mL (03/04/11 1114)  . fentaNYL infusion INTRAVENOUS 100 mcg/hr (03/05/11 0840)  . midazolam (VERSED) infusion 10 mg/hr (03/04/11 1400)  . DISCONTD: sodium chloride 75 mL/hr at 03/05/11 0800   PRN Meds:.acetaminophen, metoprolol, ondansetron (ZOFRAN) IV, ondansetron, sodium chloride, DISCONTD: acetaminophen  Labs:  CMP     Component Value Date/Time   NA 151* 03/05/2011 0437   K 2.4* 03/05/2011 0437   CL 117* 03/05/2011 0437   CO2 23 03/05/2011 0437   GLUCOSE 233* 03/05/2011 0437   BUN 37* 03/05/2011 0437   CREATININE 1.12 03/05/2011 0437   CALCIUM 8.7 03/05/2011 0437   PROT 8.4* 03/03/2011 1505   ALBUMIN 3.3* 03/03/2011 1505   AST 220* 03/03/2011 1505   ALT 78* 03/03/2011 1505   ALKPHOS 47 03/03/2011 1505   BILITOT 1.1 03/03/2011 1505   GFRNONAA 64* 03/05/2011 0437   GFRAA 74* 03/05/2011 0437     Intake/Output Summary (Last 24 hours) at 03/05/11 1145 Last data filed at 03/05/11 0840  Gross per 24 hour  Intake 3612.99 ml  Output   1150 ml  Net 2462.99 ml    Weight Status: last available wt 12/25  NUTRITION DIAGNOSIS:  -Inadequate oral intake (NI-2.1). Status: Ongoing   RELATED TO: inability to eat   AS EVIDENCE BY: respiratory failure, NPO status   GOAL: Pt will meet > 90% minimum est energy and protein requirements. Goal Met.   MONITORING: TF tol, wt change and labs   EDUCATION NEEDS:  -Education not appropriate at this time   INTERVENTION: Continuous TF Jev 1.2 @ goal rate of 60 ml/hr.  Rec obtain current wt.    Dietitian [phone]  Carolan Clines, Charlann Noss

## 2011-03-05 NOTE — Progress Notes (Signed)
Objective: Vital signs in last 24 hours: Filed Vitals:   03/05/11 0600 03/05/11 0700 03/05/11 0728 03/05/11 0800  BP: 134/54 159/69  154/56  Pulse: 72 86  94  Temp:      TempSrc:      Resp: 15 17  23   Height:      Weight:      SpO2: 98% 98% 97% 96%   Weight change:   Intake/Output Summary (Last 24 hours) at 03/05/11 0847 Last data filed at 03/05/11 0840  Gross per 24 hour  Intake 4091.31 ml  Output   1150 ml  Net 2941.31 ml   Physical Exam: Gen.: Intubated, off sedation. Agitated. Eyes closed. Does not follow commands. Lungs: Tachypnea. Diminished throughout Cardiovascular regular rate rhythm without murmurs gallops rubs Abdomen soft nontender nondistended Extremities no clubbing cyanosis or edema  Lab Results: Basic Metabolic Panel:  Lab 03/05/11 1610 03/05/11 0437 03/03/11 1505  NA -- 151* 144  K -- 2.4* 3.2*  CL -- 117* 105  CO2 -- 23 23  GLUCOSE -- 233* 115*  BUN -- 37* 57*  CREATININE -- 1.12 1.35  CALCIUM -- 8.7 9.9  MG 2.6* -- --  PHOS -- -- 1.9*   Liver Function Tests:  Lab 03/03/11 1505  AST 220*  ALT 78*  ALKPHOS 47  BILITOT 1.1  PROT 8.4*  ALBUMIN 3.3*   No results found for this basename: LIPASE:2,AMYLASE:2 in the last 168 hours No results found for this basename: AMMONIA:2 in the last 168 hours CBC:  Lab 03/04/11 0529 03/03/11 1505  WBC 9.5 12.3*  NEUTROABS 9.0* 11.7*  HGB 10.6* 12.7*  HCT 31.4* 36.6*  MCV 95.4 95.3  PLT 369 428*   Cardiac Enzymes:  Lab 03/05/11 0437 03/04/11 1146 03/04/11 0529 03/03/11 1505  CKTOTAL 1768* 3134* 4155* --  CKMB -- 6.0* 6.7* 9.0*  CKMBINDEX -- -- -- --  TROPONINI -- 1.03* 0.74* 0.40*   BNP: No results found for this basename: PROBNP:3 in the last 168 hours D-Dimer: No results found for this basename: DDIMER:2 in the last 168 hours CBG:  Lab 03/05/11 0754 03/05/11 0357 03/05/11 0007 03/04/11 2008  GLUCAP 217* 191* 173* 183*   Hemoglobin A1C: No results found for this basename: HGBA1C in  the last 168 hours Fasting Lipid Panel: No results found for this basename: CHOL,HDL,LDLCALC,TRIG,CHOLHDL,LDLDIRECT in the last 960 hours Thyroid Function Tests:  Lab 03/03/11 1505  TSH 0.872  T4TOTAL --  FREET4 --  T3FREE --  THYROIDAB --   Coagulation:  Lab 03/03/11 1505  LABPROT 16.2*  INR 1.27   Anemia Panel: No results found for this basename: VITAMINB12,FOLATE,FERRITIN,TIBC,IRON,RETICCTPCT in the last 168 hours Urine Drug Screen: Drugs of Abuse  No results found for this basename: labopia,  cocainscrnur,  labbenz,  amphetmu,  thcu,  labbarb    Alcohol Level:  Lab 03/03/11 1505  ETH <11   Lactic acid 2.2. Pro calcitonin 70. Flu swab negative.  Micro Results: Recent Results (from the past 240 hour(s))  CULTURE, BLOOD (ROUTINE X 2)     Status: Normal (Preliminary result)   Collection Time   03/03/11  3:05 PM      Component Value Range Status Comment   Specimen Description BLOOD RIGHT ANTECUBITAL   Final    Special Requests     Final    Value: BOTTLES DRAWN AEROBIC AND ANAEROBIC 6CC EACH BOTTLE   Culture NO GROWTH 1 DAY   Final    Report Status PENDING   Incomplete  CULTURE, BLOOD (ROUTINE X 2)     Status: Normal (Preliminary result)   Collection Time   03/03/11  4:31 PM      Component Value Range Status Comment   Specimen Description BLOOD RIGHT HAND   Final    Special Requests BOTTLES DRAWN AEROBIC ONLY 6CC BOTTLE   Final    Culture NO GROWTH 1 DAY   Final    Report Status PENDING   Incomplete   MRSA PCR SCREENING     Status: Normal   Collection Time   03/03/11  6:35 PM      Component Value Range Status Comment   MRSA by PCR NEGATIVE  NEGATIVE  Final    Studies/Results: Ct Head Wo Contrast  03/03/2011  *RADIOLOGY REPORT*  Clinical Data: Altered mental status.  Patient found unresponsive and unable to follow commands.  CT HEAD WITHOUT CONTRAST  Technique:  Contiguous axial images were obtained from the base of the skull through the vertex without  contrast.  Comparison: Brain MRI, 12/11/2009  Findings: The ventricles are normal in overall configuration. There is ventricular and sulcal enlargement reflecting mild atrophy.  Encephalomalacia involves most of the occipital lobes bilaterally extending to the inferior parietal lobes.  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 Other old left basal ganglier lacunar infarcts are noted with additional lacunar infarcts present in the left centrum semiovale. There is also periventricular and subcortical white matter hypoattenuation most consistent with chronic microvascular ischemic change.  Other old deep white matter lacunar infarcts noted on the previous MRI are obscured in the chronic microvascular ischemic change.  There are no parenchymal masses or mass effect.  There is no evidence of a recent transcortical infarct.  There are no extra- axial masses or abnormal fluid collections.  There is no intracranial hemorrhage.  The visualized sinuses and mastoid air cells are clear.  IMPRESSION: No acute intracranial abnormalities.  Atrophy, chronic microvascular ischemic change and multiple old infarcts are stable from the prior brain MRI.  Original Report Authenticated By:    Varney Biles Chest Port 1 View  03/05/2011  *RADIOLOGY REPORT*  Clinical Data: Respiratory failure.  PORTABLE CHEST - 1 VIEW  Comparison: Chest radiograph performed 03/04/2011  Findings: The patient's endotracheal tube is seen ending 4 cm above the carina.  An enteric tube is noted ending at the body of the stomach, with its sideport at the fundus of the stomach.  There has been interval worsening in right basilar airspace opacification, with persistent left-sided airspace disease, compatible with mildly worsening multifocal pneumonia.  No definite pleural effusion or pneumothorax is seen.  The cardiomediastinal silhouette is mildly enlarged.  No acute osseous abnormalities are identified.  IMPRESSION:  1.  Interval  worsening in right basilar airspace disease, with persistent more diffuse left-sided airspace, compatible with mildly worsening multifocal pneumonia. 2.  Mild cardiomegaly.  Original Report Authenticated By: Tonia Ghent, M.D.   Dg Chest Port 1 View  03/04/2011  *RADIOLOGY REPORT*  Clinical Data: Endotracheal tube placement.  PORTABLE CHEST - 1 VIEW  Comparison: Chest radiograph performed 03/03/2011  Findings: The patient's endotracheal tube is noted ending just above the carina, directed towards the right mainstem bronchus. This should be retracted 2-3 cm.  Lung expansion is mildly improved.  Persistent bilateral airspace opacification is seen, significantly more dense on the left.  This is essentially unchanged in appearance.  No pleural effusion or pneumothorax is seen.  The cardiomediastinal silhouette is borderline normal in size.  No acute osseous  abnormalities are seen.  IMPRESSION:  1.  Endotracheal tube noted ending just above the carina, directed towards the right mainstem bronchus.  This should be retracted 2-3 cm. 2.  Stable appearance to bilateral pneumonia, worse on the left.  Findings were discussed with Jasmine December RN in the [facility] at 01:14 a.m. on 03/04/2011.  Original Report Authenticated By: Tonia Ghent, M.D.   Dg Chest Portable 1 View  03/03/2011  *RADIOLOGY REPORT*  Clinical Data: Altered mental status  PORTABLE CHEST - 1 VIEW  Comparison: None.  Findings: 1652 hours.  Interstitial markings are diffusely coarsened with chronic features.  There is patchy bibasilar airspace disease, left greater than right. The cardiopericardial silhouette is enlarged. Telemetry leads overlie the chest.  IMPRESSION: Left greater than right basilar airspace disease superimposed on chronic underlying interstitial coarsening.  Imaging features suggest bibasilar pneumonia with COPD.  Original Report Authenticated By: ERIC A. MANSELL, M.D.   Dg Chest Port 1v Same Day  03/03/2011  *RADIOLOGY REPORT*   Clinical Data: Respiratory distress.  PORTABLE CHEST - 1 VIEW SAME DAY  Comparison: Chest radiograph performed earlier today at 04:52 p.m.  Findings: The lungs are mildly hypoexpanded.  There is a relatively stable appearance to dense left-sided airspace opacification, compatible with pneumonia.  Mild right-sided airspace opacity is also seen.  There is no evidence of pleural effusion or pneumothorax.  The cardiomediastinal silhouette is within normal limits.  No acute osseous abnormalities are seen.  IMPRESSION: Lungs mildly hypoexpanded; relatively stable appearance to multifocal pneumonia, relatively dense at the left lower lung zone.  Original Report Authenticated By: Tonia Ghent, M.D.   Scheduled Meds:    . albuterol  2.5 mg Nebulization Q6H  . antiseptic oral rinse  1 application Mouth Rinse QID  . azithromycin  500 mg Intravenous Q24H  . chlorhexidine  15 mL Mouth/Throat BID  . enoxaparin  40 mg Subcutaneous Q24H  . insulin aspart  0-9 Units Subcutaneous Q4H  . insulin glargine  5 Units Subcutaneous Daily  . ipratropium  0.5 mg Nebulization Q6H  . lidocaine (cardiac) 100 mg/81ml      . methylPREDNISolone (SOLU-MEDROL) injection  80 mg Intravenous Q8H  . pantoprazole sodium  40 mg Per Tube Q1200  . piperacillin-tazobactam (ZOSYN)  IV  3.375 g Intravenous Q8H  . potassium chloride  10 mEq Intravenous Q1 Hr x 5  . potassium chloride  40 mEq Oral Q4H  . rocuronium      . vancomycin  750 mg Intravenous Q12H  . vancomycin  1,000 mg Intravenous Once  . DISCONTD: albuterol-ipratropium  6 puff Inhalation Q4H  . DISCONTD: pantoprazole (PROTONIX) IV  40 mg Intravenous QHS   Continuous Infusions:    . dextrose    . feeding supplement (JEVITY 1.2) 1,000 mL (03/04/11 1114)  . fentaNYL infusion INTRAVENOUS 100 mcg/hr (03/05/11 0840)  . midazolam (VERSED) infusion 10 mg/hr (03/04/11 1400)  . DISCONTD: sodium chloride 75 mL/hr at 03/05/11 0800   PRN Meds:.acetaminophen, metoprolol,  ondansetron (ZOFRAN) IV, ondansetron, DISCONTD: acetaminophen Assessment/Plan: Principal Problem:  *Respiratory failure Active Problems:  CAP (community acquired pneumonia)  Toxic encephalopathy  Dehydration  Rhabdomyolysis, improving  Benign hypertension  Hypokalemia  Hyperlipidemia  GERD (gastroesophageal reflux disease)  Chronic pain  hypernatremia Hypokalemia Hyperglycemia secondary to steroids and tube feedings  Chest x-ray worse today. Tachypnea can agitated off sedation. Does not appear ready to extubate. Change maintenance IV fluid to dextrose in water. Add Lantus 5 units daily. Continue sliding scale insulin. Continue broad-spectrum antibiotics.  Replete potassium. Tolerating tube feeds.  Critical care time 30 minutes   LOS: 2 days   Delonta Yohannes L 03/05/2011, 8:47 AM

## 2011-03-05 NOTE — Progress Notes (Addendum)
0600-Notified Dr. Orvan Falconer of potassium level of 2.4. 0605-Dr. Orvan Falconer is ordering a STAT magnesium level at this time.

## 2011-03-06 ENCOUNTER — Other Ambulatory Visit: Payer: Self-pay

## 2011-03-06 ENCOUNTER — Encounter (HOSPITAL_COMMUNITY): Payer: Self-pay | Admitting: Internal Medicine

## 2011-03-06 ENCOUNTER — Inpatient Hospital Stay (HOSPITAL_COMMUNITY): Payer: Medicare Other

## 2011-03-06 DIAGNOSIS — E870 Hyperosmolality and hypernatremia: Secondary | ICD-10-CM | POA: Diagnosis not present

## 2011-03-06 DIAGNOSIS — R739 Hyperglycemia, unspecified: Secondary | ICD-10-CM | POA: Diagnosis present

## 2011-03-06 DIAGNOSIS — D649 Anemia, unspecified: Secondary | ICD-10-CM

## 2011-03-06 HISTORY — DX: Anemia, unspecified: D64.9

## 2011-03-06 LAB — DIFFERENTIAL
Eosinophils Absolute: 0 10*3/uL (ref 0.0–0.7)
Lymphocytes Relative: 5 % — ABNORMAL LOW (ref 12–46)
Lymphs Abs: 0.5 10*3/uL — ABNORMAL LOW (ref 0.7–4.0)
Monocytes Relative: 7 % (ref 3–12)
Neutro Abs: 9.7 10*3/uL — ABNORMAL HIGH (ref 1.7–7.7)
Neutrophils Relative %: 88 % — ABNORMAL HIGH (ref 43–77)

## 2011-03-06 LAB — GLUCOSE, CAPILLARY: Glucose-Capillary: 213 mg/dL — ABNORMAL HIGH (ref 70–99)

## 2011-03-06 LAB — CBC
Hemoglobin: 9.5 g/dL — ABNORMAL LOW (ref 13.0–17.0)
MCH: 32.5 pg (ref 26.0–34.0)
RBC: 2.92 MIL/uL — ABNORMAL LOW (ref 4.22–5.81)
WBC: 11 10*3/uL — ABNORMAL HIGH (ref 4.0–10.5)

## 2011-03-06 LAB — BLOOD GAS, ARTERIAL
Acid-Base Excess: 3.2 mmol/L — ABNORMAL HIGH (ref 0.0–2.0)
FIO2: 60 %
RATE: 16 resp/min
pCO2 arterial: 39.9 mmHg (ref 35.0–45.0)
pH, Arterial: 7.446 (ref 7.350–7.450)
pO2, Arterial: 68.6 mmHg — ABNORMAL LOW (ref 80.0–100.0)

## 2011-03-06 LAB — BASIC METABOLIC PANEL
Calcium: 8.1 mg/dL — ABNORMAL LOW (ref 8.4–10.5)
Creatinine, Ser: 1.18 mg/dL (ref 0.50–1.35)
GFR calc Af Amer: 69 mL/min — ABNORMAL LOW (ref >90)

## 2011-03-06 LAB — VANCOMYCIN, TROUGH: Vancomycin Tr: 14.7 ug/mL (ref 10.0–20.0)

## 2011-03-06 MED ORDER — INSULIN ASPART 100 UNIT/ML ~~LOC~~ SOLN
0.0000 [IU] | SUBCUTANEOUS | Status: DC
  Administered 2011-03-06: 16:00:00 via SUBCUTANEOUS
  Administered 2011-03-06: 11 [IU] via SUBCUTANEOUS
  Administered 2011-03-06 – 2011-03-07 (×3): 7 [IU] via SUBCUTANEOUS
  Administered 2011-03-07: 11 [IU] via SUBCUTANEOUS
  Administered 2011-03-07 (×2): 7 [IU] via SUBCUTANEOUS
  Administered 2011-03-07 – 2011-03-08 (×2): 4 [IU] via SUBCUTANEOUS
  Administered 2011-03-08: 7 [IU] via SUBCUTANEOUS
  Administered 2011-03-08: 3 [IU] via SUBCUTANEOUS
  Administered 2011-03-08 (×2): 4 [IU] via SUBCUTANEOUS
  Administered 2011-03-08: 7 [IU] via SUBCUTANEOUS
  Administered 2011-03-09 (×2): 3 [IU] via SUBCUTANEOUS

## 2011-03-06 MED ORDER — METHYLPREDNISOLONE SODIUM SUCC 125 MG IJ SOLR
60.0000 mg | Freq: Two times a day (BID) | INTRAMUSCULAR | Status: DC
  Administered 2011-03-06 – 2011-03-08 (×4): 60 mg via INTRAVENOUS
  Filled 2011-03-06 (×4): qty 2

## 2011-03-06 MED ORDER — INSULIN GLARGINE 100 UNIT/ML ~~LOC~~ SOLN
15.0000 [IU] | Freq: Two times a day (BID) | SUBCUTANEOUS | Status: DC
  Administered 2011-03-06 – 2011-03-08 (×6): 15 [IU] via SUBCUTANEOUS

## 2011-03-06 MED ORDER — FREE WATER
200.0000 mL | Freq: Four times a day (QID) | Status: DC
  Administered 2011-03-06 – 2011-03-13 (×29): 200 mL
  Filled 2011-03-06 (×42): qty 200

## 2011-03-06 MED ORDER — METOPROLOL TARTRATE 1 MG/ML IV SOLN
5.0000 mg | Freq: Four times a day (QID) | INTRAVENOUS | Status: DC
  Administered 2011-03-06 – 2011-03-07 (×4): 5 mg via INTRAVENOUS
  Filled 2011-03-06 (×3): qty 5

## 2011-03-06 MED ORDER — POTASSIUM CL IN DEXTROSE 5% 20 MEQ/L IV SOLN
20.0000 meq | INTRAVENOUS | Status: DC
  Administered 2011-03-06 – 2011-03-08 (×4): 20 meq via INTRAVENOUS

## 2011-03-06 MED ORDER — ASPIRIN 81 MG PO CHEW
81.0000 mg | CHEWABLE_TABLET | Freq: Every day | ORAL | Status: DC
  Administered 2011-03-06 – 2011-03-20 (×11): 81 mg via ORAL
  Filled 2011-03-06 (×11): qty 1

## 2011-03-06 MED ORDER — SODIUM CHLORIDE 0.9 % IJ SOLN
INTRAMUSCULAR | Status: AC
  Administered 2011-03-06: 10 mL
  Filled 2011-03-06: qty 3

## 2011-03-06 MED ORDER — SODIUM CHLORIDE 0.9 % IJ SOLN
INTRAMUSCULAR | Status: AC
  Filled 2011-03-06: qty 6

## 2011-03-06 MED ORDER — POTASSIUM CHLORIDE CRYS ER 20 MEQ PO TBCR
40.0000 meq | EXTENDED_RELEASE_TABLET | Freq: Three times a day (TID) | ORAL | Status: AC
  Administered 2011-03-06 (×3): 40 meq via ORAL
  Filled 2011-03-06 (×3): qty 2

## 2011-03-06 MED ORDER — SODIUM CHLORIDE 0.9 % IJ SOLN
INTRAMUSCULAR | Status: AC
  Filled 2011-03-06: qty 3

## 2011-03-06 NOTE — Progress Notes (Signed)
Pt had temp of 103 at 0400; tylenol given

## 2011-03-06 NOTE — Progress Notes (Addendum)
Subjective: The patient is sedated on the ventilator. Events noted overnight for rapid ventricular rate and a fever of 103F.  Objective: Vital signs in last 24 hours: Filed Vitals:   03/06/11 0734 03/06/11 0745 03/06/11 0800 03/06/11 0815  BP:  137/57 160/64 162/66  Pulse:  93 96 96  Temp:   103.3 F (39.6 C)   TempSrc:   Axillary   Resp:  15 18 18   Height:      Weight:      SpO2: 93% 97% 95% 95%    Intake/Output Summary (Last 24 hours) at 03/06/11 0900 Last data filed at 03/06/11 0800  Gross per 24 hour  Intake 2609.84 ml  Output   1700 ml  Net 909.84 ml    Weight change:   Physical exam: General: [name] lying in bed sedated on the ventilator. He has an ET tube inserted, and NG tube, and various telemetry leads. Lungs: A few rhonchi without any notable wheezing. Heart: S1, S2, with borderline tachycardia. Abdomen: Positive bowel sounds, soft, nontender, nondistended. Extremities: Trace of bilateral lower extremity edema. Neurologic: Sedated on the ventilator.  Lab Results: Basic Metabolic Panel:  Basename 03/06/11 0438 03/05/11 0604 03/05/11 0437 03/03/11 1505  NA 152* -- 151* --  K 3.0* -- 2.4* --  CL 114* -- 117* --  CO2 28 -- 23 --  GLUCOSE 370* -- 233* --  BUN 31* -- 37* --  CREATININE 1.18 -- 1.12 --  CALCIUM 8.1* -- 8.7 --  MG -- 2.6* -- --  PHOS -- -- -- 1.9*   Liver Function Tests:  Basename 03/03/11 1505  AST 220*  ALT 78*  ALKPHOS 47  BILITOT 1.1  PROT 8.4*  ALBUMIN 3.3*   No results found for this basename: LIPASE:2,AMYLASE:2 in the last 72 hours No results found for this basename: AMMONIA:2 in the last 72 hours CBC:  Basename 03/06/11 0438 03/04/11 0529  WBC 11.0* 9.5  NEUTROABS 9.7* 9.0*  HGB 9.5* 10.6*  HCT 29.2* 31.4*  MCV 100.0 95.4  PLT 280 369   Cardiac Enzymes:  Basename 03/05/11 0437 03/04/11 1146 03/04/11 0529 03/03/11 1505  CKTOTAL 1768* 3134* 4155* --  CKMB -- 6.0* 6.7* 9.0*  CKMBINDEX -- -- --  --  TROPONINI -- 1.03* 0.74* 0.40*   BNP: No results found for this basename: PROBNP:3 in the last 72 hours D-Dimer: No results found for this basename: DDIMER:2 in the last 72 hours CBG:  Basename 03/06/11 0727 03/06/11 0439 03/06/11 0013 03/05/11 1939 03/05/11 1647 03/05/11 1137  GLUCAP 217* 218* 257* 274* 219* 200*   Hemoglobin A1C: No results found for this basename: HGBA1C in the last 72 hours Fasting Lipid Panel: No results found for this basename: CHOL,HDL,LDLCALC,TRIG,CHOLHDL,LDLDIRECT in the last 72 hours Thyroid Function Tests:  Basename 03/03/11 1505  TSH 0.872  T4TOTAL --  FREET4 --  T3FREE --  THYROIDAB --   Anemia Panel: No results found for this basename: VITAMINB12,FOLATE,FERRITIN,TIBC,IRON,RETICCTPCT in the last 72 hours Coagulation:  Basename 03/03/11 1505  LABPROT 16.2*  INR 1.27   Urine Drug Screen: Drugs of Abuse  No results found for this basename: labopia,  cocainscrnur,  labbenz,  amphetmu,  thcu,  labbarb    Alcohol Level:  Basename 03/03/11 1505  ETH <11    Micro: Recent Results (from the past 240 hour(s))  CULTURE, BLOOD (ROUTINE X 2)     Status: Normal (Preliminary result)   Collection Time   03/03/11  3:05 PM  Component Value Range Status Comment   Specimen Description BLOOD RIGHT ANTECUBITAL   Final    Special Requests     Final    Value: BOTTLES DRAWN AEROBIC AND ANAEROBIC 6CC EACH BOTTLE   Culture NO GROWTH 2 DAYS   Final    Report Status PENDING   Incomplete   CULTURE, BLOOD (ROUTINE X 2)     Status: Normal (Preliminary result)   Collection Time   03/03/11  4:31 PM      Component Value Range Status Comment   Specimen Description BLOOD RIGHT HAND   Final    Special Requests BOTTLES DRAWN AEROBIC ONLY 6CC BOTTLE   Final    Culture NO GROWTH 2 DAYS   Final    Report Status PENDING   Incomplete   MRSA PCR SCREENING     Status: Normal   Collection Time   03/03/11  6:35 PM      Component Value Range Status Comment   MRSA  by PCR NEGATIVE  NEGATIVE  Final     Studies/Results: Dg Chest Port 1 View  03/06/2011  *RADIOLOGY REPORT*  Clinical Data: Respiratory failure.  Ventilator support.  PORTABLE CHEST - 1 VIEW  Comparison: 12/27 and multiple previous  Findings: Endotracheal tube has its tip 2.5 cm above the carina. Nasogastric tube enters the stomach.  Right arm tip of the PICC is difficult to see with certainty, but it is probably within the right atrium.  Bibasilar pneumonia persists, similar on the left and with slight further worsening on the right.  No sign of effusion.  No acute bony finding.  IMPRESSION: PICC tip is probably in the right atrium.  Difficult to see precisely.  Persistent bibasilar pneumonia with some worsening on the right.  Original Report Authenticated By: Thomasenia Sales, M.D.   Chest Portable 1 View Post Insertion To Confirm Placement As Interpreted By Radiologist  03/05/2011  *RADIOLOGY REPORT*  Clinical Data: Line placement.  PORTABLE CHEST - 1 VIEW  Comparison: 03/05/2011.  Findings: Endotracheal tube is in satisfactory position. Nasogastric tube is followed into the stomach, with the side port in the region of the gastroesophageal junction.  Right PICC tip projects over the SVC.  Heart size stable.  There is diffuse bilateral air space disease, basilar predominant.  Consolidation is seen in the left lower lobe. No pleural fluid.  IMPRESSION: Basilar dependent bilateral air space disease with consolidation in the left lower lobe, stable.  Original Report Authenticated By: Reyes Ivan, M.D.   Dg Chest Port 1 View  03/05/2011  *RADIOLOGY REPORT*  Clinical Data: Respiratory failure.  PORTABLE CHEST - 1 VIEW  Comparison: Chest radiograph performed 03/04/2011  Findings: The patient's endotracheal tube is seen ending 4 cm above the carina.  An enteric tube is noted ending at the body of the stomach, with its sideport at the fundus of the stomach.  There has been interval worsening in right  basilar airspace opacification, with persistent left-sided airspace disease, compatible with mildly worsening multifocal pneumonia.  No definite pleural effusion or pneumothorax is seen.  The cardiomediastinal silhouette is mildly enlarged.  No acute osseous abnormalities are identified.  IMPRESSION:  1.  Interval worsening in right basilar airspace disease, with persistent more diffuse left-sided airspace, compatible with mildly worsening multifocal pneumonia. 2.  Mild cardiomegaly.  Original Report Authenticated By: Tonia Ghent, M.D.    Medications: I have reviewed the patient's current medications.  Assessment: Principal Problem:  *Respiratory failure Active Problems:  CAP (community acquired  pneumonia)  Toxic encephalopathy  Dehydration  Rhabdomyolysis  Benign hypertension  Hyperlipidemia  GERD (gastroesophageal reflux disease)  Chronic pain  Hypokalemia  Anemia  Elevated troponin I level  Hypernatremia  Hyperglycemia  Elevated LFTs  1. Acute ventilator dependent respiratory failure secondary to community-acquired pneumonia. His ABG is noted. He is being treated with vancomycin, Zosyn, azithromycin, and Solu-Medrol. Bronchodilator therapy is also being given. The patient continues to be febrile through the current antibiotic treatment. I'm concerned about the Solu-Medrol which is making him more susceptible to severe infection. He does not appear to have active bronchospasms per my exam.  Elevated cardiac enzymes and possible rhabdomyolysis. I wonder if the patient has had a subendocardial MI given the appearance of the cardiac enzymes. Next  Hypertension. His blood pressure is trending upward. He is on Lotensin and hydrochlorothiazide chronically at home.  Hypernatremia/dehydration. The IV fluids will be adjusted. Free water will be added to his tube the cause of the tube feedings.  Elevated liver transaminases. This may be secondary to rhabdomyolysis. His liver function tests  will be followed.  Hypokalemia. A blood magnesium level will be assessed.  Steroid-induced hyperglycemia. I wonder if the patient has underlying type 2 diabetes mellitus. Hemoglobin A1c will be ordered.  Anemia. His hemoglobin was 12.7 on admission and has drifted downward. This may be secondary to acute and chronic disease, hemodilution, and venipuncture.  Chronic pain syndrome. He is on transdermal Duragesic chronically at home.  Plan:  1. Will add aspirin per tube and metoprolol IV for possibility of a subendocardial myocardial infarction. We'll check a followup EKG.  We'll add free water to the tube feedings and increase the rate of free water. We'll add potassium chloride to the IV fluids. We'll continue to replete his potassium chloride via the tube. We'll check another magnesium level.  Will increase Lantus to 15 units twice a day and change the sliding scale NovoLog to the resistant scale.  We'll decrease the dose of Solu-Medrol in the setting of persistent and high fever. I do not hear any active bronchospasms on exam currently. We'll check another set of blood cultures.  Will order hemoglobin A1c, one more set of cardiac enzymes, comprehensive metabolic panel, CBC and a followup EKG in the morning. We'll order another set of blood cultures today.    Critical care time: 35 minutes.    LOS: 3 days   Jazyiah Yiu 03/06/2011, 9:00 AM

## 2011-03-06 NOTE — Consult Note (Signed)
ANTIBIOTIC CONSULT NOTE   Pharmacy Consult for Vancomycin Indication: pneumonia  No Known Allergies  Patient Measurements: Height: 5\' 4"  (162.6 cm) Weight: 135 lb 9.3 oz (61.5 kg) IBW/kg (Calculated) : 59.2   Vital Signs: Temp: 103.3 F (39.6 C) (12/28 0800) Temp src: Axillary (12/28 0800) BP: 162/66 mmHg (12/28 0815) Pulse Rate: 96  (12/28 0815) Intake/Output from previous day: 12/27 0701 - 12/28 0700 In: 2849.5 [I.V.:1639.5; NG/GT:360; IV Piggyback:850] Out: 1700 [Urine:1700] Intake/Output from this shift: Total I/O In: 60 [NG/GT:60] Out: -   Labs:  Basename 03/06/11 0438 03/05/11 0437 03/04/11 0529 03/03/11 1505  WBC 11.0* -- 9.5 12.3*  HGB 9.5* -- 10.6* 12.7*  PLT 280 -- 369 428*  LABCREA -- -- -- --  CREATININE 1.18 1.12 -- 1.35   Estimated Creatinine Clearance: 47.4 ml/min (by C-G formula based on Cr of 1.18).  Basename 03/06/11 0839  VANCOTROUGH 14.7  VANCOPEAK --  VANCORANDOM --  GENTTROUGH --  GENTPEAK --  GENTRANDOM --  TOBRATROUGH --  TOBRAPEAK --  TOBRARND --  AMIKACINPEAK --  AMIKACINTROU --  AMIKACIN --    Microbiology: Recent Results (from the past 720 hour(s))  CULTURE, BLOOD (ROUTINE X 2)     Status: Normal (Preliminary result)   Collection Time   03/03/11  3:05 PM      Component Value Range Status Comment   Specimen Description BLOOD RIGHT ANTECUBITAL   Final    Special Requests     Final    Value: BOTTLES DRAWN AEROBIC AND ANAEROBIC 6CC EACH BOTTLE   Culture NO GROWTH 2 DAYS   Final    Report Status PENDING   Incomplete   CULTURE, BLOOD (ROUTINE X 2)     Status: Normal (Preliminary result)   Collection Time   03/03/11  4:31 PM      Component Value Range Status Comment   Specimen Description BLOOD RIGHT HAND   Final    Special Requests BOTTLES DRAWN AEROBIC ONLY 6CC BOTTLE   Final    Culture NO GROWTH 2 DAYS   Final    Report Status PENDING   Incomplete   MRSA PCR SCREENING     Status: Normal   Collection Time   03/03/11  6:35  PM      Component Value Range Status Comment   MRSA by PCR NEGATIVE  NEGATIVE  Final   CULTURE, BLOOD (ROUTINE X 2)     Status: Normal (Preliminary result)   Collection Time   03/06/11  8:38 AM      Component Value Range Status Comment   Specimen Description Blood   Final    Special Requests NONE [facility]   Final    Culture PENDING   Incomplete    Report Status PENDING   Incomplete   CULTURE, BLOOD (ROUTINE X 2)     Status: Normal (Preliminary result)   Collection Time   03/06/11  8:56 AM      Component Value Range Status Comment   Specimen Description Blood LEFT ARM   Final    Special Requests NONE [facility]   Final    Culture PENDING   Incomplete    Report Status PENDING   Incomplete    Medical History: Past Medical History  Diagnosis Date  . Hypertension   . GERD (gastroesophageal reflux disease)   . Hypercholesteremia   . Stroke   . Anemia 03/06/2011   Medications:  Scheduled:     . albuterol  2.5 mg Nebulization Q6H  . antiseptic  oral rinse  1 application Mouth Rinse QID  . aspirin  81 mg Oral Daily  . azithromycin  500 mg Intravenous Q24H  . chlorhexidine  15 mL Mouth/Throat BID  . enoxaparin  40 mg Subcutaneous Q24H  . free water  200 mL Per Tube Q6H  . insulin aspart  0-20 Units Subcutaneous Q4H  . insulin glargine      . insulin glargine  15 Units Subcutaneous BID  . ipratropium  0.5 mg Nebulization Q6H  . methylPREDNISolone (SOLU-MEDROL) injection  60 mg Intravenous Q12H  . metoprolol  5 mg Intravenous Q6H  . pantoprazole sodium  40 mg Per Tube Q1200  . piperacillin-tazobactam (ZOSYN)  IV  3.375 g Intravenous Q8H  . potassium chloride  10 mEq Intravenous Q1 Hr x 5  . potassium chloride  40 mEq Oral Q4H  . potassium chloride  40 mEq Oral TID  . sodium chloride  10 mL Intracatheter Q12H  . sodium chloride      . sodium chloride      . vancomycin  750 mg Intravenous Q12H  . DISCONTD: insulin aspart  0-9 Units Subcutaneous Q4H  . DISCONTD: insulin glargine  5 Units  Subcutaneous Daily  . DISCONTD: methylPREDNISolone (SOLU-MEDROL) injection  80 mg Intravenous Q8H   Assessment: Trough level on target (14.7) Anticipate some vancomycin accumulation  Goal of Therapy:  Vancomycin trough level 15-20 mcg/ml  Plan: Continue Vancomycin 750mg  iv q12hrs Labs per protocol  Valrie Hart A 03/06/2011,9:38 AM

## 2011-03-06 NOTE — Progress Notes (Signed)
03/06/2011 Respiratory Therapy: Patient remained on ventilatory support all day. Did not quality for weaning due to FIO2 60%; fever maintained most of the day; poor xray prognosis per Dr. Juanetta Gosling. Attempted to titrate FIO2 on vent; patient responded with tachycardia, tachypnea and decreased SPO2; placed back on 60%. Patient received treatments; suctioned small clear secretions; positioned tube; performed EKG.   Auburn Bilberry RRT

## 2011-03-06 NOTE — Progress Notes (Signed)
Paged Dr. Sherrie Mustache with results of EKG. Change from previous EKG on 03/03/11.

## 2011-03-06 NOTE — Progress Notes (Deleted)
Subjective: The patient is sedated on the ventilator. Events noted overnight for rapid ventricular rate and a fever of 103F.  Objective: Vital signs in last 24 hours: Filed Vitals:   03/06/11 0734 03/06/11 0745 03/06/11 0800 03/06/11 0815  BP:  137/57 160/64 162/66  Pulse:  93 96 96  Temp:   103.3 F (39.6 C)   TempSrc:   Axillary   Resp:  15 18 18   Height:      Weight:      SpO2: 93% 97% 95% 95%    Intake/Output Summary (Last 24 hours) at 03/06/11 0834 Last data filed at 03/06/11 0800  Gross per 24 hour  Intake 2674.46 ml  Output   1700 ml  Net 974.46 ml    Weight change:   Physical exam: General: [name] lying in bed sedated on the ventilator. He has an ET tube inserted, and NG tube, and various telemetry leads. Lungs: A few rhonchi without any notable wheezing. Heart: S1, S2, with borderline tachycardia. Abdomen: Positive bowel sounds, soft, nontender, nondistended. Extremities: Trace of bilateral lower extremity edema. Neurologic: Sedated on the ventilator.  Lab Results: Basic Metabolic Panel:  Basename 03/06/11 0438 03/05/11 0604 03/05/11 0437 03/03/11 1505  NA 152* -- 151* --  K 3.0* -- 2.4* --  CL 114* -- 117* --  CO2 28 -- 23 --  GLUCOSE 370* -- 233* --  BUN 31* -- 37* --  CREATININE 1.18 -- 1.12 --  CALCIUM 8.1* -- 8.7 --  MG -- 2.6* -- --  PHOS -- -- -- 1.9*   Liver Function Tests:  Basename 03/03/11 1505  AST 220*  ALT 78*  ALKPHOS 47  BILITOT 1.1  PROT 8.4*  ALBUMIN 3.3*   No results found for this basename: LIPASE:2,AMYLASE:2 in the last 72 hours No results found for this basename: AMMONIA:2 in the last 72 hours CBC:  Basename 03/06/11 0438 03/04/11 0529  WBC 11.0* 9.5  NEUTROABS 9.7* 9.0*  HGB 9.5* 10.6*  HCT 29.2* 31.4*  MCV 100.0 95.4  PLT 280 369   Cardiac Enzymes:  Basename 03/05/11 0437 03/04/11 1146 03/04/11 0529 03/03/11 1505  CKTOTAL 1768* 3134* 4155* --  CKMB -- 6.0* 6.7* 9.0*  CKMBINDEX -- -- --  --  TROPONINI -- 1.03* 0.74* 0.40*   BNP: No results found for this basename: PROBNP:3 in the last 72 hours D-Dimer: No results found for this basename: DDIMER:2 in the last 72 hours CBG:  Basename 03/06/11 0727 03/06/11 0439 03/06/11 0013 03/05/11 1939 03/05/11 1647 03/05/11 1137  GLUCAP 217* 218* 257* 274* 219* 200*   Hemoglobin A1C: No results found for this basename: HGBA1C in the last 72 hours Fasting Lipid Panel: No results found for this basename: CHOL,HDL,LDLCALC,TRIG,CHOLHDL,LDLDIRECT in the last 72 hours Thyroid Function Tests:  Basename 03/03/11 1505  TSH 0.872  T4TOTAL --  FREET4 --  T3FREE --  THYROIDAB --   Anemia Panel: No results found for this basename: VITAMINB12,FOLATE,FERRITIN,TIBC,IRON,RETICCTPCT in the last 72 hours Coagulation:  Basename 03/03/11 1505  LABPROT 16.2*  INR 1.27   Urine Drug Screen: Drugs of Abuse  No results found for this basename: labopia, cocainscrnur, labbenz, amphetmu, thcu, labbarb    Alcohol Level:  Basename 03/03/11 1505  ETH <11    Micro: Recent Results (from the past 240 hour(s))  CULTURE, BLOOD (ROUTINE X 2)     Status: Normal (Preliminary result)   Collection Time   03/03/11  3:05 PM      Component Value Range Status  Comment   Specimen Description BLOOD RIGHT ANTECUBITAL   Final    Special Requests     Final    Value: BOTTLES DRAWN AEROBIC AND ANAEROBIC 6CC EACH BOTTLE   Culture NO GROWTH 2 DAYS   Final    Report Status PENDING   Incomplete   CULTURE, BLOOD (ROUTINE X 2)     Status: Normal (Preliminary result)   Collection Time   03/03/11  4:31 PM      Component Value Range Status Comment   Specimen Description BLOOD RIGHT HAND   Final    Special Requests BOTTLES DRAWN AEROBIC ONLY 6CC BOTTLE   Final    Culture NO GROWTH 2 DAYS   Final    Report Status PENDING   Incomplete   MRSA PCR SCREENING     Status: Normal   Collection Time   03/03/11  6:35 PM      Component Value Range Status Comment   MRSA by  PCR NEGATIVE  NEGATIVE  Final     Studies/Results: Dg Chest Port 1 View  03/06/2011  *RADIOLOGY REPORT*  Clinical Data: Respiratory failure.  Ventilator support.  PORTABLE CHEST - 1 VIEW  Comparison: 12/27 and multiple previous  Findings: Endotracheal tube has its tip 2.5 cm above the carina. Nasogastric tube enters the stomach.  Right arm tip of the PICC is difficult to see with certainty, but it is probably within the right atrium.  Bibasilar pneumonia persists, similar on the left and with slight further worsening on the right.  No sign of effusion.  No acute bony finding.  IMPRESSION: PICC tip is probably in the right atrium.  Difficult to see precisely.  Persistent bibasilar pneumonia with some worsening on the right.  Original Report Authenticated By: Thomasenia Sales, M.D.   Chest Portable 1 View Post Insertion To Confirm Placement As Interpreted By Radiologist  03/05/2011  *RADIOLOGY REPORT*  Clinical Data: Line placement.  PORTABLE CHEST - 1 VIEW  Comparison: 03/05/2011.  Findings: Endotracheal tube is in satisfactory position. Nasogastric tube is followed into the stomach, with the side port in the region of the gastroesophageal junction.  Right PICC tip projects over the SVC.  Heart size stable.  There is diffuse bilateral air space disease, basilar predominant.  Consolidation is seen in the left lower lobe. No pleural fluid.  IMPRESSION: Basilar dependent bilateral air space disease with consolidation in the left lower lobe, stable.  Original Report Authenticated By: Reyes Ivan, M.D.   Dg Chest Port 1 View  03/05/2011  *RADIOLOGY REPORT*  Clinical Data: Respiratory failure.  PORTABLE CHEST - 1 VIEW  Comparison: Chest radiograph performed 03/04/2011  Findings: The patient's endotracheal tube is seen ending 4 cm above the carina.  An enteric tube is noted ending at the body of the stomach, with its sideport at the fundus of the stomach.  There has been interval worsening in right basilar  airspace opacification, with persistent left-sided airspace disease, compatible with mildly worsening multifocal pneumonia.  No definite pleural effusion or pneumothorax is seen.  The cardiomediastinal silhouette is mildly enlarged.  No acute osseous abnormalities are identified.  IMPRESSION:  1.  Interval worsening in right basilar airspace disease, with persistent more diffuse left-sided airspace, compatible with mildly worsening multifocal pneumonia. 2.  Mild cardiomegaly.  Original Report Authenticated By: Tonia Ghent, M.D.    Medications: I have reviewed the patient's current medications.  Assessment: Principal Problem:  *Respiratory failure Active Problems:  CAP (community acquired pneumonia)  Toxic encephalopathy  Dehydration  Rhabdomyolysis  Benign hypertension  Hyperlipidemia  GERD (gastroesophageal reflux disease)  Chronic pain  Hypokalemia  Anemia  Elevated troponin I level  Hypernatremia  Hyperglycemia  Elevated LFTs  1. Acute ventilator dependent respiratory failure secondary to community-acquired pneumonia. His ABG is noted. He is being treated with vancomycin, Zosyn, azithromycin, and Solu-Medrol. Bronchodilator therapy is also being given. The patient continues to be febrile through the current antibiotic treatment. I'm concerned about the Solu-Medrol which is making him more susceptible to severe infection. He does not appear to have active bronchospasms per my exam.  Elevated cardiac enzymes and possible rhabdomyolysis. I wonder if the patient has had a subendocardial MI given the appearance of the cardiac enzymes. Next  Hypertension. His blood pressure is trending upward. He is on Lotensin and hydrochlorothiazide chronically at home.  Hypernatremia/dehydration. The IV fluids will be adjusted. Free water will be added to his tube the cause of the tube feedings.  Elevated liver transaminases. This may be secondary to rhabdomyolysis. His liver function tests will be  followed.  Hypokalemia. A blood magnesium level will be assessed.  Steroid-induced hyperglycemia. I wonder if the patient has underlying type 2 diabetes mellitus. Hemoglobin A1c will be ordered.  Anemia. His hemoglobin was 12.7 on admission and has drifted downward. This may be secondary to acute and chronic disease, hemodilution, and venipuncture.  Chronic pain syndrome. He is on fit male chronically at home.  Plan:  1. Will add aspirin per tube and metoprolol IV for possibility of a subendocardial myocardial infarction. We'll check a followup EKG.  We'll add free water to the tube feedings and increase the rate of free water. We'll add potassium chloride to the IV fluids. We'll continue to replete his potassium chloride via the tube. We'll check another magnesium level.  Will increase Lantus to 15 units twice a day and change the sliding scale NovoLog to the resistant scale.  We'll decrease the dose of Solu-Medrol in the setting of persistent and high fever. I do not hear any active bronchospasms on exam currently. We'll check another set of blood cultures.  Will order hemoglobin A1c, one more set of cardiac enzymes, comprehensive metabolic panel, CBC and a followup EKG in the morning. We'll order another set of blood cultures today.    LOS: 3 days   Karesa Maultsby 03/06/2011, 8:34 AM

## 2011-03-06 NOTE — Progress Notes (Signed)
Subjective: He remains intubated on the ventilator. He had some fever last night. There have been no other significant changes. He is still requiring 60% oxygen so we don't have much opportunity to wean him. He is on vancomycin Zosyn and Zithromax which should be excellent antibiotic coverage.  Objective: Vital signs in last 24 hours: Temp:  [102.1 F (38.9 C)-103 F (39.4 C)] 103 F (39.4 C) (12/28 0400) Pulse Rate:  [88-120] 88  (12/28 0600) Resp:  [12-25] 18  (12/28 0600) BP: (58-180)/(21-82) 156/61 mmHg (12/28 0600) SpO2:  [83 %-97 %] 92 % (12/28 0600) FiO2 (%):  [39.8 %-60.3 %] 60 % (12/28 0600) Weight:  [61.5 kg (135 lb 9.3 oz)] 135 lb 9.3 oz (61.5 kg) (12/28 0400) Weight change:  Last BM Date: 03/04/11  Intake/Output from previous day: 12/27 0701 - 12/28 0700 In: 2849.5 [I.V.:1639.5; NG/GT:360; IV Piggyback:850] Out: 1700 [Urine:1700]  PHYSICAL EXAM General appearance: Intubated and sedated Resp: rhonchi bilaterally Cardio: regular rate and rhythm, S1, S2 normal, no murmur, click, rub or gallop GI: soft, non-tender; bowel sounds normal; no masses,  no organomegaly Extremities: extremities normal, atraumatic, no cyanosis or edema  Lab Results:    Basic Metabolic Panel:  Basename 03/06/11 0438 03/05/11 0604 03/05/11 0437 03/03/11 1505  NA 152* -- 151* --  K 3.0* -- 2.4* --  CL 114* -- 117* --  CO2 28 -- 23 --  GLUCOSE 370* -- 233* --  BUN 31* -- 37* --  CREATININE 1.18 -- 1.12 --  CALCIUM 8.1* -- 8.7 --  MG -- 2.6* -- --  PHOS -- -- -- 1.9*   Liver Function Tests:  Basename 03/03/11 1505  AST 220*  ALT 78*  ALKPHOS 47  BILITOT 1.1  PROT 8.4*  ALBUMIN 3.3*   No results found for this basename: LIPASE:2,AMYLASE:2 in the last 72 hours No results found for this basename: AMMONIA:2 in the last 72 hours CBC:  Basename 03/06/11 0438 03/04/11 0529  WBC 11.0* 9.5  NEUTROABS 9.7* 9.0*  HGB 9.5* 10.6*  HCT 29.2* 31.4*  MCV 100.0 95.4  PLT 280 369    Cardiac Enzymes:  Basename 03/05/11 0437 03/04/11 1146 03/04/11 0529 03/03/11 1505  CKTOTAL 1768* 3134* 4155* --  CKMB -- 6.0* 6.7* 9.0*  CKMBINDEX -- -- -- --  TROPONINI -- 1.03* 0.74* 0.40*   BNP: No results found for this basename: PROBNP:3 in the last 72 hours D-Dimer: No results found for this basename: DDIMER:2 in the last 72 hours CBG:  Basename 03/06/11 0439 03/06/11 0013 03/05/11 1939 03/05/11 1647 03/05/11 1137 03/05/11 0754  GLUCAP 218* 257* 274* 219* 200* 217*   Hemoglobin A1C: No results found for this basename: HGBA1C in the last 72 hours Fasting Lipid Panel: No results found for this basename: CHOL,HDL,LDLCALC,TRIG,CHOLHDL,LDLDIRECT in the last 72 hours Thyroid Function Tests:  Basename 03/03/11 1505  TSH 0.872  T4TOTAL --  FREET4 --  T3FREE --  THYROIDAB --   Anemia Panel: No results found for this basename: VITAMINB12,FOLATE,FERRITIN,TIBC,IRON,RETICCTPCT in the last 72 hours Coagulation:  Basename 03/03/11 1505  LABPROT 16.2*  INR 1.27   Urine Drug Screen: Drugs of Abuse  No results found for this basename: labopia, cocainscrnur, labbenz, amphetmu, thcu, labbarb    Alcohol Level:  Basename 03/03/11 1505  ETH <11   Urinalysis:  Misc. Labs:  ABGS  Basename 03/06/11 0455  PHART 7.446  PO2ART 68.6*  TCO2 25.6  HCO3 27.0*   CULTURES Recent Results (from the past 240 hour(s))  CULTURE, BLOOD (  ROUTINE X 2)     Status: Normal (Preliminary result)   Collection Time   03/03/11  3:05 PM      Component Value Range Status Comment   Specimen Description BLOOD RIGHT ANTECUBITAL   Final    Special Requests     Final    Value: BOTTLES DRAWN AEROBIC AND ANAEROBIC 6CC EACH BOTTLE   Culture NO GROWTH 2 DAYS   Final    Report Status PENDING   Incomplete   CULTURE, BLOOD (ROUTINE X 2)     Status: Normal (Preliminary result)   Collection Time   03/03/11  4:31 PM      Component Value Range Status Comment   Specimen Description BLOOD RIGHT HAND    Final    Special Requests BOTTLES DRAWN AEROBIC ONLY 6CC BOTTLE   Final    Culture NO GROWTH 2 DAYS   Final    Report Status PENDING   Incomplete   MRSA PCR SCREENING     Status: Normal   Collection Time   03/03/11  6:35 PM      Component Value Range Status Comment   MRSA by PCR NEGATIVE  NEGATIVE  Final    Studies/Results: Chest Portable 1 View Post Insertion To Confirm Placement As Interpreted By Radiologist  03/05/2011  *RADIOLOGY REPORT*  Clinical Data: Line placement.  PORTABLE CHEST - 1 VIEW  Comparison: 03/05/2011.  Findings: Endotracheal tube is in satisfactory position. Nasogastric tube is followed into the stomach, with the side port in the region of the gastroesophageal junction.  Right PICC tip projects over the SVC.  Heart size stable.  There is diffuse bilateral air space disease, basilar predominant.  Consolidation is seen in the left lower lobe. No pleural fluid.  IMPRESSION: Basilar dependent bilateral air space disease with consolidation in the left lower lobe, stable.  Original Report Authenticated By: Reyes Ivan, M.D.   Dg Chest Port 1 View  03/05/2011  *RADIOLOGY REPORT*  Clinical Data: Respiratory failure.  PORTABLE CHEST - 1 VIEW  Comparison: Chest radiograph performed 03/04/2011  Findings: The patient's endotracheal tube is seen ending 4 cm above the carina.  An enteric tube is noted ending at the body of the stomach, with its sideport at the fundus of the stomach.  There has been interval worsening in right basilar airspace opacification, with persistent left-sided airspace disease, compatible with mildly worsening multifocal pneumonia.  No definite pleural effusion or pneumothorax is seen.  The cardiomediastinal silhouette is mildly enlarged.  No acute osseous abnormalities are identified.  IMPRESSION:  1.  Interval worsening in right basilar airspace disease, with persistent more diffuse left-sided airspace, compatible with mildly worsening multifocal pneumonia. 2.   Mild cardiomegaly.  Original Report Authenticated By: Tonia Ghent, M.D.    Medications:  Scheduled:   . albuterol  2.5 mg Nebulization Q6H  . antiseptic oral rinse  1 application Mouth Rinse QID  . azithromycin  500 mg Intravenous Q24H  . chlorhexidine  15 mL Mouth/Throat BID  . enoxaparin  40 mg Subcutaneous Q24H  . insulin aspart  0-9 Units Subcutaneous Q4H  . insulin glargine      . insulin glargine  5 Units Subcutaneous Daily  . ipratropium  0.5 mg Nebulization Q6H  . methylPREDNISolone (SOLU-MEDROL) injection  80 mg Intravenous Q8H  . pantoprazole sodium  40 mg Per Tube Q1200  . piperacillin-tazobactam (ZOSYN)  IV  3.375 g Intravenous Q8H  . potassium chloride  10 mEq Intravenous Q1 Hr x 5  .  potassium chloride  40 mEq Oral Q4H  . sodium chloride  10 mL Intracatheter Q12H  . sodium chloride      . vancomycin  750 mg Intravenous Q12H   Continuous:   . dextrose 50 mL/hr at 03/06/11 0600  . feeding supplement (JEVITY 1.2) 1,000 mL (03/05/11 1511)  . fentaNYL infusion INTRAVENOUS 200 mcg/hr (03/06/11 0600)  . midazolam (VERSED) infusion 10 mg/hr (03/06/11 0702)  . DISCONTD: sodium chloride 75 mL/hr at 03/05/11 0800   JWJ:[id number], metoprolol, ondansetron (ZOFRAN) IV, ondansetron, sodium chloride, DISCONTD: acetaminophen  Assesment: He has multilobar pneumonia. He has respiratory failure from that. He remains on 60% oxygen so he is not a candidate for much weaning at this point. He has still had some fever despite triple antibiotic coverage Principal Problem:  *Respiratory failure Active Problems:  CAP (community acquired pneumonia)  Toxic encephalopathy  Dehydration  Rhabdomyolysis  Benign hypertension  Hyperlipidemia  GERD (gastroesophageal reflux disease)  Chronic pain  Hypokalemia    Plan: I will last for repeat blood cultures if he has another fever like that. I would continue with his current treatments otherwise.    LOS: 3 days   Doyt Castellana  L 03/06/2011, 7:20 AM

## 2011-03-06 NOTE — Progress Notes (Signed)
Utilization review completed.  

## 2011-03-07 ENCOUNTER — Encounter (HOSPITAL_COMMUNITY): Payer: Self-pay | Admitting: Internal Medicine

## 2011-03-07 ENCOUNTER — Inpatient Hospital Stay (HOSPITAL_COMMUNITY): Payer: Medicare Other

## 2011-03-07 DIAGNOSIS — I214 Non-ST elevation (NSTEMI) myocardial infarction: Secondary | ICD-10-CM

## 2011-03-07 HISTORY — DX: Non-ST elevation (NSTEMI) myocardial infarction: I21.4

## 2011-03-07 LAB — COMPREHENSIVE METABOLIC PANEL
Albumin: 2.1 g/dL — ABNORMAL LOW (ref 3.5–5.2)
Alkaline Phosphatase: 38 U/L — ABNORMAL LOW (ref 39–117)
BUN: 34 mg/dL — ABNORMAL HIGH (ref 6–23)
CO2: 31 mEq/L (ref 19–32)
Chloride: 112 mEq/L (ref 96–112)
GFR calc non Af Amer: 80 mL/min — ABNORMAL LOW (ref >90)
Glucose, Bld: 263 mg/dL — ABNORMAL HIGH (ref 70–99)
Potassium: 3.1 mEq/L — ABNORMAL LOW (ref 3.5–5.1)
Total Bilirubin: 0.2 mg/dL — ABNORMAL LOW (ref 0.3–1.2)

## 2011-03-07 LAB — GLUCOSE, CAPILLARY
Glucose-Capillary: 157 mg/dL — ABNORMAL HIGH (ref 70–99)
Glucose-Capillary: 208 mg/dL — ABNORMAL HIGH (ref 70–99)
Glucose-Capillary: 212 mg/dL — ABNORMAL HIGH (ref 70–99)
Glucose-Capillary: 222 mg/dL — ABNORMAL HIGH (ref 70–99)

## 2011-03-07 LAB — BLOOD GAS, ARTERIAL
Acid-Base Excess: 4.7 mmol/L — ABNORMAL HIGH (ref 0.0–2.0)
Bicarbonate: 28.5 mEq/L — ABNORMAL HIGH (ref 20.0–24.0)
FIO2: 0.6 %
O2 Saturation: 97.9 %
TCO2: 24.8 mmol/L (ref 0–100)
pCO2 arterial: 40.7 mmHg (ref 35.0–45.0)
pO2, Arterial: 95.7 mmHg (ref 80.0–100.0)

## 2011-03-07 LAB — MAGNESIUM: Magnesium: 2.3 mg/dL (ref 1.5–2.5)

## 2011-03-07 LAB — CARDIAC PANEL(CRET KIN+CKTOT+MB+TROPI): CK, MB: 1.7 ng/mL (ref 0.3–4.0)

## 2011-03-07 LAB — CBC
MCV: 100.3 fL — ABNORMAL HIGH (ref 78.0–100.0)
Platelets: 268 10*3/uL (ref 150–400)
RDW: 14.7 % (ref 11.5–15.5)
WBC: 11.7 10*3/uL — ABNORMAL HIGH (ref 4.0–10.5)

## 2011-03-07 MED ORDER — METOPROLOL TARTRATE 25 MG PO TABS
12.5000 mg | ORAL_TABLET | Freq: Two times a day (BID) | ORAL | Status: DC
  Administered 2011-03-07 – 2011-03-16 (×17): 12.5 mg via ORAL
  Filled 2011-03-07 (×18): qty 1

## 2011-03-07 MED ORDER — POTASSIUM CHLORIDE CRYS ER 20 MEQ PO TBCR
40.0000 meq | EXTENDED_RELEASE_TABLET | Freq: Four times a day (QID) | ORAL | Status: AC
  Administered 2011-03-07 (×4): 40 meq via ORAL
  Filled 2011-03-07 (×4): qty 2

## 2011-03-07 NOTE — Progress Notes (Signed)
Subjective: He is overall about the same. He remains intubated and on the ventilator. He was unable to wean yesterday because he still on 60% O2 and he is still on 60% O2 now  Objective: Vital signs in last 24 hours: Temp:  [99.1 F (37.3 C)-101.8 F (38.8 C)] 99.6 F (37.6 C) (12/29 0800) Pulse Rate:  [66-128] 72  (12/29 0815) Resp:  [9-28] 12  (12/29 0815) BP: (107-177)/(43-121) 119/54 mmHg (12/29 0815) SpO2:  [93 %-99 %] 98 % (12/29 0815) FiO2 (%):  [54.4 %-60.8 %] 59.9 % (12/29 0815) Weight:  [62.9 kg (138 lb 10.7 oz)] 138 lb 10.7 oz (62.9 kg) (12/29 0500) Weight change: 1.4 kg (3 lb 1.4 oz) Last BM Date: 03/04/11  Intake/Output from previous day: 12/28 0701 - 12/29 0700 In: 4319.4 [I.V.:1849.4; NG/GT:1520; IV Piggyback:950] Out: 1950 [Urine:1950]  PHYSICAL EXAM General appearance: Intubated and sedated Resp: rhonchi bilaterally Cardio: regular rate and rhythm, S1, S2 normal, no murmur, click, rub or gallop GI: soft, non-tender; bowel sounds normal; no masses,  no organomegaly Extremities: extremities normal, atraumatic, no cyanosis or edema  Lab Results:    Basic Metabolic Panel:  Basename 03/07/11 0453 03/06/11 0438 03/05/11 0604  NA 149* 152* --  K 3.1* 3.0* --  CL 112 114* --  CO2 31 28 --  GLUCOSE 263* 370* --  BUN 34* 31* --  CREATININE 0.99 1.18 --  CALCIUM 8.3* 8.1* --  MG 2.3 -- 2.6*  PHOS -- -- --   Liver Function Tests:  Basename 03/07/11 0453  AST 21  ALT 30  ALKPHOS 38*  BILITOT 0.2*  PROT 5.7*  ALBUMIN 2.1*   No results found for this basename: LIPASE:2,AMYLASE:2 in the last 72 hours No results found for this basename: AMMONIA:2 in the last 72 hours CBC:  Basename 03/07/11 0453 03/06/11 0438  WBC 11.7* 11.0*  NEUTROABS -- 9.7*  HGB 9.8* 9.5*  HCT 30.3* 29.2*  MCV 100.3* 100.0  PLT 268 280   Cardiac Enzymes:  Basename 03/07/11 0453 03/05/11 0437 03/04/11 1146  CKTOTAL 475* 1768* 3134*  CKMB 1.7 -- 6.0*  CKMBINDEX -- -- --    TROPONINI 1.13* -- 1.03*   BNP: No results found for this basename: PROBNP:3 in the last 72 hours D-Dimer: No results found for this basename: DDIMER:2 in the last 72 hours CBG:  Basename 03/07/11 0728 03/07/11 0354 03/06/11 2359 03/06/11 1953 03/06/11 1600 03/06/11 1124  GLUCAP 228* 280* 212* 269* 213* 202*   Hemoglobin A1C:  Basename 03/06/11 0839  HGBA1C 5.8*   Fasting Lipid Panel: No results found for this basename: CHOL,HDL,LDLCALC,TRIG,CHOLHDL,LDLDIRECT in the last 72 hours Thyroid Function Tests: No results found for this basename: TSH,T4TOTAL,FREET4,T3FREE,THYROIDAB in the last 72 hours Anemia Panel: No results found for this basename: VITAMINB12,FOLATE,FERRITIN,TIBC,IRON,RETICCTPCT in the last 72 hours Coagulation: No results found for this basename: LABPROT:2,INR:2 in the last 72 hours Urine Drug Screen: Drugs of Abuse  No results found for this basename: labopia, cocainscrnur, labbenz, amphetmu, thcu, labbarb    Alcohol Level: No results found for this basename: ETH:2 in the last 72 hours Urinalysis:  Misc. Labs:  ABGS  Basename 03/07/11 0430  PHART 7.460*  PO2ART 95.7  TCO2 24.8  HCO3 28.5*   CULTURES Recent Results (from the past 240 hour(s))  CULTURE, BLOOD (ROUTINE X 2)     Status: Normal (Preliminary result)   Collection Time   03/03/11  3:05 PM      Component Value Range Status Comment   Specimen Description  BLOOD RIGHT ANTECUBITAL   Final    Special Requests     Final    Value: BOTTLES DRAWN AEROBIC AND ANAEROBIC 6CC EACH BOTTLE   Culture NO GROWTH 4 DAYS   Final    Report Status PENDING   Incomplete   CULTURE, BLOOD (ROUTINE X 2)     Status: Normal (Preliminary result)   Collection Time   03/03/11  4:31 PM      Component Value Range Status Comment   Specimen Description BLOOD RIGHT HAND   Final    Special Requests BOTTLES DRAWN AEROBIC ONLY 6CC BOTTLE   Final    Culture NO GROWTH 4 DAYS   Final    Report Status PENDING   Incomplete    MRSA PCR SCREENING     Status: Normal   Collection Time   03/03/11  6:35 PM      Component Value Range Status Comment   MRSA by PCR NEGATIVE  NEGATIVE  Final   CULTURE, BLOOD (ROUTINE X 2)     Status: Normal (Preliminary result)   Collection Time   03/06/11  8:38 AM      Component Value Range Status Comment   Specimen Description Blood   Final    Special Requests NONE 6CC   Final    Culture NO GROWTH 1 DAY   Final    Report Status PENDING   Incomplete   CULTURE, BLOOD (ROUTINE X 2)     Status: Normal (Preliminary result)   Collection Time   03/06/11  8:56 AM      Component Value Range Status Comment   Specimen Description Blood LEFT ARM   Final    Special Requests NONE 6CC   Final    Culture NO GROWTH 1 DAY   Final    Report Status PENDING   Incomplete    Studies/Results: Dg Chest Port 1 View  03/06/2011  *RADIOLOGY REPORT*  Clinical Data: Respiratory failure.  Ventilator support.  PORTABLE CHEST - 1 VIEW  Comparison: 12/27 and multiple previous  Findings: Endotracheal tube has its tip 2.5 cm above the carina. Nasogastric tube enters the stomach.  Right arm tip of the PICC is difficult to see with certainty, but it is probably within the right atrium.  Bibasilar pneumonia persists, similar on the left and with slight further worsening on the right.  No sign of effusion.  No acute bony finding.  IMPRESSION: PICC tip is probably in the right atrium.  Difficult to see precisely.  Persistent bibasilar pneumonia with some worsening on the right.  Original Report Authenticated By: Thomasenia Sales, M.D.   Chest Portable 1 View Post Insertion To Confirm Placement As Interpreted By Radiologist  03/05/2011  *RADIOLOGY REPORT*  Clinical Data: Line placement.  PORTABLE CHEST - 1 VIEW  Comparison: 03/05/2011.  Findings: Endotracheal tube is in satisfactory position. Nasogastric tube is followed into the stomach, with the side port in the region of the gastroesophageal junction.  Right PICC tip  projects over the SVC.  Heart size stable.  There is diffuse bilateral air space disease, basilar predominant.  Consolidation is seen in the left lower lobe. No pleural fluid.  IMPRESSION: Basilar dependent bilateral air space disease with consolidation in the left lower lobe, stable.  Original Report Authenticated By: Reyes Ivan, M.D.    Medications:  Scheduled:   . albuterol  2.5 mg Nebulization Q6H  . antiseptic oral rinse  1 application Mouth Rinse QID  . aspirin  81 mg  Oral Daily  . azithromycin  500 mg Intravenous Q24H  . chlorhexidine  15 mL Mouth/Throat BID  . enoxaparin  40 mg Subcutaneous Q24H  . free water  200 mL Per Tube Q6H  . insulin aspart  0-20 Units Subcutaneous Q4H  . insulin glargine  15 Units Subcutaneous BID  . ipratropium  0.5 mg Nebulization Q6H  . methylPREDNISolone (SOLU-MEDROL) injection  60 mg Intravenous Q12H  . metoprolol tartrate  12.5 mg Oral BID  . pantoprazole sodium  40 mg Per Tube Q1200  . piperacillin-tazobactam (ZOSYN)  IV  3.375 g Intravenous Q8H  . potassium chloride  40 mEq Oral TID  . potassium chloride  40 mEq Oral QID  . sodium chloride  10 mL Intracatheter Q12H  . sodium chloride      . vancomycin  750 mg Intravenous Q12H  . DISCONTD: metoprolol  5 mg Intravenous Q6H   Continuous:   . dextrose 5 % with KCl 20 mEq / L 20 mEq (03/07/11 0800)  . feeding supplement (JEVITY 1.2) 1,000 mL (03/05/11 1511)  . fentaNYL infusion INTRAVENOUS Stopped (03/07/11 0730)  . midazolam (VERSED) infusion Stopped (03/07/11 0730)   ZOX:[id number], ondansetron (ZOFRAN) IV, ondansetron, sodium chloride, DISCONTD: metoprolol  Assesment: He has respiratory failure on the basis of pneumonia. He's been treated. He has continued on high flow oxygen I don't think we can make much progress toward trying to get him extubated yet Principal Problem:  *Respiratory failure Active Problems:  CAP (community acquired pneumonia)  Toxic encephalopathy   Dehydration  Rhabdomyolysis  Benign hypertension  Hyperlipidemia  GERD (gastroesophageal reflux disease)  Chronic pain  Hypokalemia  Anemia  Elevated troponin I level  Hypernatremia  Hyperglycemia  Elevated LFTs  Non-ST elevation MI (NSTEMI)    Plan: Continue with the current treatments including ventilator care. He is on appropriate antibiotics et Karie Soda.    LOS: 4 days   Caedyn Raygoza L 03/07/2011, 9:11 AM

## 2011-03-07 NOTE — Progress Notes (Signed)
Critical troponin value of 1.13 reported to Dr. Orvan Falconer.  Previous troponins have also been elevated. No new orders at this time

## 2011-03-07 NOTE — Progress Notes (Signed)
Subjective: The patient is sedated on the ventilator. No acute events noted overnight.  Objective: Vital signs in last 24 hours: Filed Vitals:   03/07/11 0700 03/07/11 0715 03/07/11 0730 03/07/11 0745  BP: 133/52 117/49 123/56 136/53  Pulse: 128 66 70 71  Temp:      TempSrc:      Resp: 14 14 14 12   Height:      Weight:      SpO2: 98% 98% 99% 99%    Intake/Output Summary (Last 24 hours) at 03/07/11 0800 Last data filed at 03/07/11 0600  Gross per 24 hour  Intake 4073.93 ml  Output   1950 ml  Net 2123.93 ml    Weight change: 1.4 kg (3 lb 1.4 oz)  Physical exam: General: [name] lying in bed sedated on the ventilator. He has an ET tube inserted, and NG tube, and various telemetry leads. Lungs: A few rhonchi without any notable wheezing. Heart: S1, S2, with borderline tachycardia. Abdomen: Positive bowel sounds, soft, nontender, nondistended. Extremities: Trace of bilateral lower extremity edema. Neurologic: Sedated on the ventilator.  Lab Results: Basic Metabolic Panel:  Basename 03/07/11 0453 03/06/11 0438 03/05/11 0604  NA 149* 152* --  K 3.1* 3.0* --  CL 112 114* --  CO2 31 28 --  GLUCOSE 263* 370* --  BUN 34* 31* --  CREATININE 0.99 1.18 --  CALCIUM 8.3* 8.1* --  MG 2.3 -- 2.6*  PHOS -- -- --   Liver Function Tests:  Basename 03/07/11 0453  AST 21  ALT 30  ALKPHOS 38*  BILITOT 0.2*  PROT 5.7*  ALBUMIN 2.1*   No results found for this basename: LIPASE:2,AMYLASE:2 in the last 72 hours No results found for this basename: AMMONIA:2 in the last 72 hours CBC:  Basename 03/07/11 0453 03/06/11 0438  WBC 11.7* 11.0*  NEUTROABS -- 9.7*  HGB 9.8* 9.5*  HCT 30.3* 29.2*  MCV 100.3* 100.0  PLT 268 280   Cardiac Enzymes:  Basename 03/07/11 0453 03/05/11 0437 03/04/11 1146  CKTOTAL 475* 1768* 3134*  CKMB 1.7 -- 6.0*  CKMBINDEX -- -- --  TROPONINI 1.13* -- 1.03*   BNP: No results found for this basename: PROBNP:3 in the last 72  hours D-Dimer: No results found for this basename: DDIMER:2 in the last 72 hours CBG:  Basename 03/07/11 0728 03/07/11 0354 03/06/11 2359 03/06/11 1953 03/06/11 1600 03/06/11 1124  GLUCAP 228* 280* 212* 269* 213* 202*   Hemoglobin A1C:  Basename 03/06/11 0839  HGBA1C 5.8*   Fasting Lipid Panel: No results found for this basename: CHOL,HDL,LDLCALC,TRIG,CHOLHDL,LDLDIRECT in the last 72 hours Thyroid Function Tests: No results found for this basename: TSH,T4TOTAL,FREET4,T3FREE,THYROIDAB in the last 72 hours Anemia Panel: No results found for this basename: VITAMINB12,FOLATE,FERRITIN,TIBC,IRON,RETICCTPCT in the last 72 hours Coagulation: No results found for this basename: LABPROT:2,INR:2 in the last 72 hours Urine Drug Screen: Drugs of Abuse  No results found for this basename: labopia,  cocainscrnur,  labbenz,  amphetmu,  thcu,  labbarb    Alcohol Level: No results found for this basename: ETH:2 in the last 72 hours  Micro: Recent Results (from the past 240 hour(s))  CULTURE, BLOOD (ROUTINE X 2)     Status: Normal (Preliminary result)   Collection Time   03/03/11  3:05 PM      Component Value Range Status Comment   Specimen Description BLOOD RIGHT ANTECUBITAL   Final    Special Requests     Final    Value: BOTTLES DRAWN  AEROBIC AND ANAEROBIC 6CC EACH BOTTLE   Culture NO GROWTH 4 DAYS   Final    Report Status PENDING   Incomplete   CULTURE, BLOOD (ROUTINE X 2)     Status: Normal (Preliminary result)   Collection Time   03/03/11  4:31 PM      Component Value Range Status Comment   Specimen Description BLOOD RIGHT HAND   Final    Special Requests BOTTLES DRAWN AEROBIC ONLY 6CC BOTTLE   Final    Culture NO GROWTH 4 DAYS   Final    Report Status PENDING   Incomplete   MRSA PCR SCREENING     Status: Normal   Collection Time   03/03/11  6:35 PM      Component Value Range Status Comment   MRSA by PCR NEGATIVE  NEGATIVE  Final   CULTURE, BLOOD (ROUTINE X 2)     Status: Normal  (Preliminary result)   Collection Time   03/06/11  8:38 AM      Component Value Range Status Comment   Specimen Description Blood   Final    Special Requests NONE 6CC   Final    Culture NO GROWTH 1 DAY   Final    Report Status PENDING   Incomplete   CULTURE, BLOOD (ROUTINE X 2)     Status: Normal (Preliminary result)   Collection Time   03/06/11  8:56 AM      Component Value Range Status Comment   Specimen Description Blood LEFT ARM   Final    Special Requests NONE 6CC   Final    Culture NO GROWTH 1 DAY   Final    Report Status PENDING   Incomplete     Studies/Results: Dg Chest Port 1 View  03/06/2011  *RADIOLOGY REPORT*  Clinical Data: Respiratory failure.  Ventilator support.  PORTABLE CHEST - 1 VIEW  Comparison: 12/27 and multiple previous  Findings: Endotracheal tube has its tip 2.5 cm above the carina. Nasogastric tube enters the stomach.  Right arm tip of the PICC is difficult to see with certainty, but it is probably within the right atrium.  Bibasilar pneumonia persists, similar on the left and with slight further worsening on the right.  No sign of effusion.  No acute bony finding.  IMPRESSION: PICC tip is probably in the right atrium.  Difficult to see precisely.  Persistent bibasilar pneumonia with some worsening on the right.  Original Report Authenticated By: Thomasenia Sales, M.D.   Chest Portable 1 View Post Insertion To Confirm Placement As Interpreted By Radiologist  03/05/2011  *RADIOLOGY REPORT*  Clinical Data: Line placement.  PORTABLE CHEST - 1 VIEW  Comparison: 03/05/2011.  Findings: Endotracheal tube is in satisfactory position. Nasogastric tube is followed into the stomach, with the side port in the region of the gastroesophageal junction.  Right PICC tip projects over the SVC.  Heart size stable.  There is diffuse bilateral air space disease, basilar predominant.  Consolidation is seen in the left lower lobe. No pleural fluid.  IMPRESSION: Basilar dependent bilateral  air space disease with consolidation in the left lower lobe, stable.  Original Report Authenticated By: Reyes Ivan, M.D.    Medications: I have reviewed the patient's current medications.  Assessment: Principal Problem:  *Respiratory failure Active Problems:  CAP (community acquired pneumonia)  Toxic encephalopathy  Dehydration  Rhabdomyolysis  Benign hypertension  Hyperlipidemia  GERD (gastroesophageal reflux disease)  Chronic pain  Hypokalemia  Anemia  Elevated troponin I  level  Hypernatremia  Hyperglycemia  Elevated LFTs  Non-ST elevation MI (NSTEMI)  1. Acute ventilator dependent respiratory failure secondary to community-acquired pneumonia. His ABG is noted. He did not tolerate a decrease in FiO2. He is being treated with vancomycin, Zosyn, azithromycin, and Solu-Medrol. Bronchodilator therapy is also being given. The patient's fever curve is better. Solu-Medrol was decreased yesterday in the setting of ongoing fever.  Elevated cardiac enzymes consistent with a non-ST elevation myocardial infarction. Aspirin and metoprolol were started yesterday. He remains on prophylactic Lovenox. His EKG on 03/06/2011 reveals normal sinus rhythm with a heart rate of 94 beats per minute, left axis deviation, and inferior/anterior lateral ST and T wave abnormalities.  Hypertension. His blood pressure is better today on IV metoprolol. Will change metoprolol to per tube and titrate accordingly. He is on Lotensin and hydrochlorothiazide chronically at home.  Hypernatremia/dehydration. His serum sodium is better with the addition of free water via the tube. We'll continue to monitor and just IV fluids as needed.  Elevated liver transaminases. Resolved. This may have been secondary to rhabdomyolysis.   Hypokalemia. His blood magnesium level is within normal limits.  Steroid-induced hyperglycemia/secondary to tube feedings. Insulin was titrated up yesterday. Hopefully with the decrease the  Solu-Medrol, his blood sugars will improve. His hemoglobin A1c is 5.8.  Anemia. His hemoglobin was 12.7 on admission and has drifted downward. This may be secondary to acute and chronic disease, hemodilution, and venipuncture.  Chronic pain syndrome. He is on transdermal Duragesic chronically at home.  Plan:  Continue current management. Will decrease Solu-Medrol further in the morning. Will give metoprolol via the NG tube. Will increase potassium chloride supplementation to 4 times daily today and continue to monitor.    Critical care time: 35 minutes.    LOS: 4 days   Janssen Zee 03/07/2011, 8:00 AM

## 2011-03-08 ENCOUNTER — Inpatient Hospital Stay (HOSPITAL_COMMUNITY): Payer: Medicare Other

## 2011-03-08 LAB — BLOOD GAS, ARTERIAL
Acid-Base Excess: 5.2 mmol/L — ABNORMAL HIGH (ref 0.0–2.0)
Bicarbonate: 28.6 mEq/L — ABNORMAL HIGH (ref 20.0–24.0)
Bicarbonate: 29.3 mEq/L — ABNORMAL HIGH (ref 20.0–24.0)
FIO2: 40 %
O2 Content: 40 L/min
PEEP: 5 cmH2O
Patient temperature: 37
Patient temperature: 37
TCO2: 26.8 mmol/L (ref 0–100)
TCO2: 27.1 mmol/L (ref 0–100)
pCO2 arterial: 41.6 mmHg (ref 35.0–45.0)
pCO2 arterial: 43.2 mmHg (ref 35.0–45.0)
pH, Arterial: 7.452 — ABNORMAL HIGH (ref 7.350–7.450)
pH, Arterial: 7.445 (ref 7.350–7.450)
pO2, Arterial: 65.4 mmHg — ABNORMAL LOW (ref 80.0–100.0)

## 2011-03-08 LAB — GLUCOSE, CAPILLARY
Glucose-Capillary: 145 mg/dL — ABNORMAL HIGH (ref 70–99)
Glucose-Capillary: 153 mg/dL — ABNORMAL HIGH (ref 70–99)
Glucose-Capillary: 175 mg/dL — ABNORMAL HIGH (ref 70–99)
Glucose-Capillary: 201 mg/dL — ABNORMAL HIGH (ref 70–99)

## 2011-03-08 LAB — BASIC METABOLIC PANEL
BUN: 38 mg/dL — ABNORMAL HIGH (ref 6–23)
Calcium: 8.2 mg/dL — ABNORMAL LOW (ref 8.4–10.5)
Creatinine, Ser: 0.98 mg/dL (ref 0.50–1.35)
GFR calc Af Amer: >90 mL/min (ref >90)
GFR calc non Af Amer: 80 mL/min — ABNORMAL LOW (ref >90)
Glucose, Bld: 193 mg/dL — ABNORMAL HIGH (ref 70–99)

## 2011-03-08 LAB — CULTURE, BLOOD (ROUTINE X 2)
Culture: NO GROWTH
Culture: NO GROWTH

## 2011-03-08 LAB — PRO B NATRIURETIC PEPTIDE: Pro B Natriuretic peptide (BNP): 6773 pg/mL — ABNORMAL HIGH (ref 0–125)

## 2011-03-08 MED ORDER — POTASSIUM CHLORIDE CRYS ER 20 MEQ PO TBCR
20.0000 meq | EXTENDED_RELEASE_TABLET | Freq: Two times a day (BID) | ORAL | Status: DC
  Administered 2011-03-08 – 2011-03-14 (×12): 20 meq via ORAL
  Filled 2011-03-08 (×14): qty 1

## 2011-03-08 MED ORDER — METHYLPREDNISOLONE SODIUM SUCC 125 MG IJ SOLR
60.0000 mg | INTRAMUSCULAR | Status: DC
  Administered 2011-03-09 – 2011-03-12 (×4): 60 mg via INTRAVENOUS
  Filled 2011-03-08 (×4): qty 2

## 2011-03-08 MED ORDER — FUROSEMIDE 10 MG/ML IJ SOLN
10.0000 mg | Freq: Two times a day (BID) | INTRAMUSCULAR | Status: AC
  Administered 2011-03-08 (×2): 10 mg via INTRAVENOUS
  Filled 2011-03-08 (×2): qty 2

## 2011-03-08 MED ORDER — POTASSIUM CL IN DEXTROSE 5% 20 MEQ/L IV SOLN
20.0000 meq | INTRAVENOUS | Status: DC
  Administered 2011-03-08 – 2011-03-14 (×5): 20 meq via INTRAVENOUS

## 2011-03-08 NOTE — Progress Notes (Signed)
Respiratory Therapy, 03/08/11: Patient assessed; vent checks done; treatments given.  Patient not qualify for weaning due to FIO2 >40%, continued to be febrile, limited responsiveness off sedation, continued worsening xray.  Patient titrated to FIO2 40%.  Dr Juanetta Gosling directed patient be placed on ARDS protocol; collect ABG post initiated per protocol; maintain patient per protocol.  Patient placed of sedation, vent changes made, ABG collected.  No further changes made as ABG and vent data were consistent with ARDS goals.

## 2011-03-08 NOTE — Progress Notes (Signed)
Subjective: The patient is sedated on the ventilator. No acute events noted overnight.  Objective: Vital signs in last 24 hours: Filed Vitals:   03/08/11 0530 03/08/11 0545 03/08/11 0600 03/08/11 0615  BP: 128/47 135/47 139/52 143/60  Pulse: 80 80 83 84  Temp:      TempSrc:      Resp: 13 14 20 21   Height:      Weight:      SpO2: 94% 94% 94% 93%    Intake/Output Summary (Last 24 hours) at 03/08/11 1610 Last data filed at 03/08/11 0600  Gross per 24 hour  Intake 4116.73 ml  Output   1651 ml  Net 2465.73 ml    Weight change: 10.4 kg (22 lb 14.8 oz)  Physical exam: General: [name] lying in bed sedated on the ventilator. He has an ET tube inserted, and NG tube, and various telemetry leads. Lungs: Fewer rhonchi than yesterday.Marland Kitchen Heart: S1, S2. Abdomen: Positive bowel sounds, soft, nontender, nondistended. Extremities: Trace of bilateral lower extremity edema. Neurologic: Sedated on the ventilator.  Lab Results: Basic Metabolic Panel:  Basename 03/08/11 0507 03/07/11 0453  NA 145 149*  K 4.0 3.1*  CL 109 112  CO2 31 31  GLUCOSE 193* 263*  BUN 38* 34*  CREATININE 0.98 0.99  CALCIUM 8.2* 8.3*  MG -- 2.3  PHOS -- --   Liver Function Tests:  Basename 03/07/11 0453  AST 21  ALT 30  ALKPHOS 38*  BILITOT 0.2*  PROT 5.7*  ALBUMIN 2.1*   No results found for this basename: LIPASE:2,AMYLASE:2 in the last 72 hours No results found for this basename: AMMONIA:2 in the last 72 hours CBC:  Basename 03/07/11 0453 03/06/11 0438  WBC 11.7* 11.0*  NEUTROABS -- 9.7*  HGB 9.8* 9.5*  HCT 30.3* 29.2*  MCV 100.3* 100.0  PLT 268 280   Cardiac Enzymes:  Basename 03/07/11 0453  CKTOTAL 475*  CKMB 1.7  CKMBINDEX --  TROPONINI 1.13*   BNP: No results found for this basename: PROBNP:3 in the last 72 hours D-Dimer: No results found for this basename: DDIMER:2 in the last 72 hours CBG:  Basename 03/08/11 0319 03/08/11 0005 03/07/11 1954 03/07/11 1639  03/07/11 1112 03/07/11 0728  GLUCAP 238* 201* 222* 157* 208* 228*   Hemoglobin A1C:  Basename 03/06/11 0839  HGBA1C 5.8*   Fasting Lipid Panel: No results found for this basename: CHOL,HDL,LDLCALC,TRIG,CHOLHDL,LDLDIRECT in the last 72 hours Thyroid Function Tests: No results found for this basename: TSH,T4TOTAL,FREET4,T3FREE,THYROIDAB in the last 72 hours Anemia Panel: No results found for this basename: VITAMINB12,FOLATE,FERRITIN,TIBC,IRON,RETICCTPCT in the last 72 hours Coagulation: No results found for this basename: LABPROT:2,INR:2 in the last 72 hours Urine Drug Screen: Drugs of Abuse  No results found for this basename: labopia,  cocainscrnur,  labbenz,  amphetmu,  thcu,  labbarb    Alcohol Level: No results found for this basename: ETH:2 in the last 72 hours  Micro: Recent Results (from the past 240 hour(s))  CULTURE, BLOOD (ROUTINE X 2)     Status: Normal (Preliminary result)   Collection Time   03/03/11  3:05 PM      Component Value Range Status Comment   Specimen Description BLOOD RIGHT ANTECUBITAL   Final    Special Requests     Final    Value: BOTTLES DRAWN AEROBIC AND ANAEROBIC 6CC EACH BOTTLE   Culture NO GROWTH 4 DAYS   Final    Report Status PENDING   Incomplete   CULTURE, BLOOD (ROUTINE  X 2)     Status: Normal (Preliminary result)   Collection Time   03/03/11  4:31 PM      Component Value Range Status Comment   Specimen Description BLOOD RIGHT HAND   Final    Special Requests BOTTLES DRAWN AEROBIC ONLY 6CC BOTTLE   Final    Culture NO GROWTH 4 DAYS   Final    Report Status PENDING   Incomplete   MRSA PCR SCREENING     Status: Normal   Collection Time   03/03/11  6:35 PM      Component Value Range Status Comment   MRSA by PCR NEGATIVE  NEGATIVE  Final   CULTURE, BLOOD (ROUTINE X 2)     Status: Normal (Preliminary result)   Collection Time   03/06/11  8:38 AM      Component Value Range Status Comment   Specimen Description Blood   Final    Special  Requests NONE 6CC   Final    Culture NO GROWTH 1 DAY   Final    Report Status PENDING   Incomplete   CULTURE, BLOOD (ROUTINE X 2)     Status: Normal (Preliminary result)   Collection Time   03/06/11  8:56 AM      Component Value Range Status Comment   Specimen Description Blood LEFT ARM   Final    Special Requests NONE [facility]   Final    Culture NO GROWTH 1 DAY   Final    Report Status PENDING   Incomplete     Studies/Results: Dg Chest Port 1 View  03/07/2011  *RADIOLOGY REPORT*  Clinical Data: Rest toward failure  PORTABLE CHEST - 1 VIEW  Comparison: 03/06/2011, 03/05/2011, 03/03/2011.  Findings: Endotracheal tube is 2 cm above the carina.  Nasogastric tube terminates in the stomach. Right upper extremity PICC terminates at the cavoatrial junction.  Cardiac mediastinal silhouette is stable.  The lung volumes are very low.  There is bilateral patchy airspace disease throughout both lungs.  Aeration of the lungs is decreased on today's examination compared to yesterday.  IMPRESSION: Diffuse bilateral airspace disease and low lung volumes.  Worsening aeration of the lungs on today's examination may be due to increasing atelectasis or increasing airspace disease.  Original Report Authenticated By: Britta Mccreedy, M.D.    Medications: I have reviewed the patient's current medications.  Assessment: Principal Problem:  *Respiratory failure Active Problems:  CAP (community acquired pneumonia)  Toxic encephalopathy  Dehydration  Rhabdomyolysis  Benign hypertension  Hyperlipidemia  GERD (gastroesophageal reflux disease)  Chronic pain  Hypokalemia  Anemia  Elevated troponin I level  Hypernatremia  Hyperglycemia  Elevated LFTs  Non-ST elevation MI (NSTEMI)  1. Acute ventilator dependent respiratory failure secondary to community-acquired pneumonia. His ABG is noted. He is being treated with vancomycin, Zosyn, azithromycin, and Solu-Medrol, and bronchodilator therapy. He continues to be  intermittently febrile. His blood cultures are negative x4 so far. Will decrease the Solu-Medrol more.  Elevated cardiac enzymes consistent with a non-ST elevation myocardial infarction. Aspirin and metoprolol were started yesterday. He remains on prophylactic Lovenox. His EKG on 03/06/2011 reveals normal sinus rhythm with a heart rate of 94 beats per minute, left axis deviation, and inferior/anterior lateral ST and T wave abnormalities.  Hypertension. His blood pressure is better on metoprolol.  He is on Lotensin and hydrochlorothiazide chronically at home.  Hypernatremia/dehydration. His serum sodium is better with the addition of free water via the tube. We'll continue to monitor and just  IV fluids as needed.  Elevated liver transaminases. Resolved. This may have been secondary to rhabdomyolysis.   Hypokalemia. His blood magnesium level is within normal limits. His serum potassium is within normal limits with supplementation.  Steroid-induced hyperglycemia/secondary to tube feedings. Insulin has been titrated up. Hopefully with the decrease the Solu-Medrol, his blood sugars will improve. His hemoglobin A1c is 5.8.  Anemia. His hemoglobin was 12.7 on admission and has drifted downward. This may be secondary to acute and chronic disease, hemodilution, and venipuncture.  Chronic pain syndrome. He is on transdermal Duragesic chronically at home.  Plan:  Continue daily  Potassium supplementation. Decrease Solu-Medrol to once daily. Ventilator management and recommendations per Dr. Juanetta Gosling. Because of the patient's ins and outs being positive over the last several days, will decrease IV fluids slightly and give him Lasix 10 mg every 12 hours x24 hours. We'll check a pro BNP in the morning.    Critical care time: 30 minutes.    LOS: 5 days   Jahnya Trindade 03/08/2011, 7:33 AM

## 2011-03-08 NOTE — Progress Notes (Signed)
Pt remained on same settings during night. Suction small to mod amounts thick yellow secretions. Tolerated treatments well.

## 2011-03-08 NOTE — Progress Notes (Signed)
Subjective: There has been no significant change overnight. His chest x-ray looks more like he may be developing ARDS at this point however. He remains intubated on the ventilator and sedated.  Objective: Vital signs in last 24 hours: Temp:  [98.9 F (37.2 C)-101.6 F (38.7 C)] 99.4 F (37.4 C) (12/30 0400) Pulse Rate:  [65-93] 86  (12/30 0845) Resp:  [12-28] 16  (12/30 0845) BP: (105-170)/(40-132) 135/48 mmHg (12/30 0845) SpO2:  [92 %-100 %] 94 % (12/30 0845) FiO2 (%):  [40 %-99.5 %] 40 % (12/30 0845) Weight:  [73.3 kg (161 lb 9.6 oz)] 161 lb 9.6 oz (73.3 kg) (12/30 0500) Weight change: 10.4 kg (22 lb 14.8 oz) Last BM Date: 03/07/11  Intake/Output from previous day: 12/29 0701 - 12/30 0700 In: 4185.7 [I.V.:1965.7; NG/GT:1520; IV Piggyback:700] Out: 1651 [Urine:1650; Stool:1]  PHYSICAL EXAM General appearance: no distress and Intubated and sedated Resp: rales bilaterally and rhonchi bilaterally Cardio: regular rate and rhythm, S1, S2 normal, no murmur, click, rub or gallop GI: soft, non-tender; bowel sounds normal; no masses,  no organomegaly Extremities: extremities normal, atraumatic, no cyanosis or edema  Lab Results:    Basic Metabolic Panel:  Basename 03/08/11 0507 03/07/11 0453  NA 145 149*  K 4.0 3.1*  CL 109 112  CO2 31 31  GLUCOSE 193* 263*  BUN 38* 34*  CREATININE 0.98 0.99  CALCIUM 8.2* 8.3*  MG -- 2.3  PHOS -- --   Liver Function Tests:  Basename 03/07/11 0453  AST 21  ALT 30  ALKPHOS 38*  BILITOT 0.2*  PROT 5.7*  ALBUMIN 2.1*   No results found for this basename: LIPASE:2,AMYLASE:2 in the last 72 hours No results found for this basename: AMMONIA:2 in the last 72 hours CBC:  Basename 03/07/11 0453 03/06/11 0438  WBC 11.7* 11.0*  NEUTROABS -- 9.7*  HGB 9.8* 9.5*  HCT 30.3* 29.2*  MCV 100.3* 100.0  PLT 268 280   Cardiac Enzymes:  Basename 03/07/11 0453  CKTOTAL 475*  CKMB 1.7  CKMBINDEX --  TROPONINI 1.13*   BNP: No results  found for this basename: PROBNP:3 in the last 72 hours D-Dimer: No results found for this basename: DDIMER:2 in the last 72 hours CBG:  Basename 03/08/11 0750 03/08/11 0319 03/08/11 0005 03/07/11 1954 03/07/11 1639 03/07/11 1112  GLUCAP 175* 238* 201* 222* 157* 208*   Hemoglobin A1C:  Basename 03/06/11 0839  HGBA1C 5.8*   Fasting Lipid Panel: No results found for this basename: CHOL,HDL,LDLCALC,TRIG,CHOLHDL,LDLDIRECT in the last 72 hours Thyroid Function Tests: No results found for this basename: TSH,T4TOTAL,FREET4,T3FREE,THYROIDAB in the last 72 hours Anemia Panel: No results found for this basename: VITAMINB12,FOLATE,FERRITIN,TIBC,IRON,RETICCTPCT in the last 72 hours Coagulation: No results found for this basename: LABPROT:2,INR:2 in the last 72 hours Urine Drug Screen: Drugs of Abuse  No results found for this basename: labopia, cocainscrnur, labbenz, amphetmu, thcu, labbarb    Alcohol Level: No results found for this basename: ETH:2 in the last 72 hours Urinalysis:  Misc. Labs:  ABGS  Basename 03/08/11 0505  PHART 7.452*  PO2ART 65.4*  TCO2 26.8  HCO3 28.6*   CULTURES Recent Results (from the past 240 hour(s))  CULTURE, BLOOD (ROUTINE X 2)     Status: Normal (Preliminary result)   Collection Time   03/03/11  3:05 PM      Component Value Range Status Comment   Specimen Description BLOOD RIGHT ANTECUBITAL   Final    Special Requests     Final    Value: BOTTLES  DRAWN AEROBIC AND ANAEROBIC 6CC EACH BOTTLE   Culture NO GROWTH 4 DAYS   Final    Report Status PENDING   Incomplete   CULTURE, BLOOD (ROUTINE X 2)     Status: Normal (Preliminary result)   Collection Time   03/03/11  4:31 PM      Component Value Range Status Comment   Specimen Description BLOOD RIGHT HAND   Final    Special Requests BOTTLES DRAWN AEROBIC ONLY 6CC BOTTLE   Final    Culture NO GROWTH 4 DAYS   Final    Report Status PENDING   Incomplete   MRSA PCR SCREENING     Status: Normal    Collection Time   03/03/11  6:35 PM      Component Value Range Status Comment   MRSA by PCR NEGATIVE  NEGATIVE  Final   CULTURE, BLOOD (ROUTINE X 2)     Status: Normal (Preliminary result)   Collection Time   03/06/11  8:38 AM      Component Value Range Status Comment   Specimen Description Blood   Final    Special Requests NONE 6CC   Final    Culture NO GROWTH 1 DAY   Final    Report Status PENDING   Incomplete   CULTURE, BLOOD (ROUTINE X 2)     Status: Normal (Preliminary result)   Collection Time   03/06/11  8:56 AM      Component Value Range Status Comment   Specimen Description Blood LEFT ARM   Final    Special Requests NONE [facility]   Final    Culture NO GROWTH 1 DAY   Final    Report Status PENDING   Incomplete    Studies/Results: Dg Chest Port 1 View  03/07/2011  *RADIOLOGY REPORT*  Clinical Data: Rest toward failure  PORTABLE CHEST - 1 VIEW  Comparison: 03/06/2011, 03/05/2011, 03/03/2011.  Findings: Endotracheal tube is 2 cm above the carina.  Nasogastric tube terminates in the stomach. Right upper extremity PICC terminates at the cavoatrial junction.  Cardiac mediastinal silhouette is stable.  The lung volumes are very low.  There is bilateral patchy airspace disease throughout both lungs.  Aeration of the lungs is decreased on today's examination compared to yesterday.  IMPRESSION: Diffuse bilateral airspace disease and low lung volumes.  Worsening aeration of the lungs on today's examination may be due to increasing atelectasis or increasing airspace disease.  Original Report Authenticated By: Britta Mccreedy, M.D.    Medications:  Scheduled:   . albuterol  2.5 mg Nebulization Q6H  . antiseptic oral rinse  1 application Mouth Rinse QID  . aspirin  81 mg Oral Daily  . azithromycin  500 mg Intravenous Q24H  . chlorhexidine  15 mL Mouth/Throat BID  . enoxaparin  40 mg Subcutaneous Q24H  . free water  200 mL Per Tube Q6H  . furosemide  10 mg Intravenous Q12H  . insulin aspart   0-20 Units Subcutaneous Q4H  . insulin glargine  15 Units Subcutaneous BID  . ipratropium  0.5 mg Nebulization Q6H  . methylPREDNISolone (SOLU-MEDROL) injection  60 mg Intravenous Q24H  . metoprolol tartrate  12.5 mg Oral BID  . pantoprazole sodium  40 mg Per Tube Q1200  . piperacillin-tazobactam (ZOSYN)  IV  3.375 g Intravenous Q8H  . potassium chloride  20 mEq Oral BID  . potassium chloride  40 mEq Oral QID  . sodium chloride  10 mL Intracatheter Q12H  . vancomycin  750  mg Intravenous Q12H  . DISCONTD: methylPREDNISolone (SOLU-MEDROL) injection  60 mg Intravenous Q12H   Continuous:   . dextrose 5 % with KCl 20 mEq / L 20 mEq (03/08/11 0800)  . feeding supplement (JEVITY 1.2) 1,000 mL (03/07/11 1546)  . fentaNYL infusion INTRAVENOUS Stopped (03/08/11 0715)  . midazolam (VERSED) infusion Stopped (03/08/11 0715)  . DISCONTD: dextrose 5 % with KCl 20 mEq / L 20 mEq (03/08/11 0600)   ZOX:[id number], ondansetron (ZOFRAN) IV, ondansetron, sodium chloride  Assesment: His chest x-ray now looks more like he may be developing ARDS. He started with community-acquired pneumonia that was bilateral and multifocal. He's been treated as if this is staph is on total 3 antibiotics. He also could be having some problems with cardiogenic pulmonary edema considering the clinical situation I think we need to switch him to the ARDS protocol for ventilation. Principal Problem:  *Respiratory failure Active Problems:  CAP (community acquired pneumonia)  Toxic encephalopathy  Dehydration  Rhabdomyolysis  Benign hypertension  Hyperlipidemia  GERD (gastroesophageal reflux disease)  Chronic pain  Hypokalemia  Anemia  Elevated troponin I level  Hypernatremia  Hyperglycemia  Elevated LFTs  Non-ST elevation MI (NSTEMI)    Plan: Change the ARDS protocol continue with other treatments check a BNP    LOS: 5 days   Treyvone Chelf L 03/08/2011, 9:02 AM

## 2011-03-08 NOTE — Progress Notes (Signed)
03/08/11 Respiratory Therapy:  Patient placed on sedation and initiated ARDS protocol on ventilator per Dr Juanetta Gosling order @1000 .  Obtained ABG per protocol with results meeting parameters of ARDS protocol.  Ventilator meets ARDS protocol.  No further adjustment warranted at this time.  Charlott Holler RRT

## 2011-03-09 DIAGNOSIS — J811 Chronic pulmonary edema: Secondary | ICD-10-CM | POA: Diagnosis not present

## 2011-03-09 LAB — GLUCOSE, CAPILLARY: Glucose-Capillary: 141 mg/dL — ABNORMAL HIGH (ref 70–99)

## 2011-03-09 LAB — BASIC METABOLIC PANEL
Calcium: 8.2 mg/dL — ABNORMAL LOW (ref 8.4–10.5)
GFR calc non Af Amer: 71 mL/min — ABNORMAL LOW (ref >90)
Potassium: 3.7 mEq/L (ref 3.5–5.1)
Sodium: 145 mEq/L (ref 135–145)

## 2011-03-09 MED ORDER — FUROSEMIDE 10 MG/ML IJ SOLN
20.0000 mg | Freq: Every day | INTRAMUSCULAR | Status: DC
  Administered 2011-03-09 – 2011-03-13 (×5): 20 mg via INTRAVENOUS
  Filled 2011-03-09 (×5): qty 2

## 2011-03-09 MED ORDER — INSULIN GLARGINE 100 UNIT/ML ~~LOC~~ SOLN
7.0000 [IU] | Freq: Two times a day (BID) | SUBCUTANEOUS | Status: DC
  Administered 2011-03-09 – 2011-03-12 (×7): 7 [IU] via SUBCUTANEOUS
  Filled 2011-03-09: qty 3

## 2011-03-09 MED ORDER — FUROSEMIDE 10 MG/ML IJ SOLN
INTRAMUSCULAR | Status: AC
  Administered 2011-03-09: 20 mg via INTRAVENOUS
  Filled 2011-03-09: qty 4

## 2011-03-09 MED ORDER — INSULIN ASPART 100 UNIT/ML ~~LOC~~ SOLN
0.0000 [IU] | SUBCUTANEOUS | Status: DC
  Administered 2011-03-09 – 2011-03-10 (×4): 1 [IU] via SUBCUTANEOUS
  Administered 2011-03-10: 2 [IU] via SUBCUTANEOUS
  Administered 2011-03-10: 1 [IU] via SUBCUTANEOUS
  Administered 2011-03-10: 0 [IU] via SUBCUTANEOUS
  Administered 2011-03-10: 2 [IU] via SUBCUTANEOUS
  Administered 2011-03-10: 0 [IU] via SUBCUTANEOUS
  Administered 2011-03-10: 2 [IU] via SUBCUTANEOUS
  Administered 2011-03-11: 1 [IU] via SUBCUTANEOUS
  Administered 2011-03-11: 2 [IU] via SUBCUTANEOUS
  Administered 2011-03-11: 3 [IU] via SUBCUTANEOUS
  Administered 2011-03-11: 0 [IU] via SUBCUTANEOUS
  Administered 2011-03-11: 1 [IU] via SUBCUTANEOUS
  Administered 2011-03-12: 2 [IU] via SUBCUTANEOUS
  Administered 2011-03-12 (×3): 1 [IU] via SUBCUTANEOUS
  Administered 2011-03-12: 5 [IU] via SUBCUTANEOUS
  Administered 2011-03-12: 3 [IU] via SUBCUTANEOUS
  Administered 2011-03-13: 2 [IU] via SUBCUTANEOUS
  Administered 2011-03-14 – 2011-03-15 (×4): 0 [IU] via SUBCUTANEOUS
  Administered 2011-03-15 (×2): 1 [IU] via SUBCUTANEOUS
  Administered 2011-03-16: 0 [IU] via SUBCUTANEOUS
  Administered 2011-03-16 – 2011-03-17 (×6): 1 [IU] via SUBCUTANEOUS
  Administered 2011-03-17: 2 [IU] via SUBCUTANEOUS
  Administered 2011-03-18: 1 [IU] via SUBCUTANEOUS
  Administered 2011-03-18: 0 [IU] via SUBCUTANEOUS
  Administered 2011-03-18 (×3): 1 [IU] via SUBCUTANEOUS
  Administered 2011-03-19 (×2): 2 [IU] via SUBCUTANEOUS
  Administered 2011-03-19 – 2011-03-20 (×5): 1 [IU] via SUBCUTANEOUS
  Administered 2011-03-20: 2 [IU] via SUBCUTANEOUS
  Filled 2011-03-09 (×2): qty 3

## 2011-03-09 NOTE — Progress Notes (Signed)
RECEIVED CALL FROM FELICA, CASE MANAGER AT [employer].Marland Kitchen  MELODY HANKINS, FROM [employer] WILL BE HIS APS SOCIAL WORKER. SHE PLANS TO COME TOMORROW 03/10/11.

## 2011-03-09 NOTE — Progress Notes (Signed)
Subjective: The patient is sedated on the ventilator. No acute events noted overnight.  Objective: Vital signs in last 24 hours: Filed Vitals:   03/09/11 0630 03/09/11 0645 03/09/11 0717 03/09/11 0718  BP: 123/56 125/56  131/53  Pulse: 67 69    Temp:      TempSrc:      Resp: 15 15    Height:      Weight:      SpO2: 97% 97% 99%     Intake/Output Summary (Last 24 hours) at 03/09/11 0818 Last data filed at 03/09/11 0600  Gross per 24 hour  Intake 3340.75 ml  Output   2150 ml  Net 1190.75 ml    Weight change: 0.2 kg (7.1 oz)  Physical exam: General: [name] lying in bed sedated on the ventilator. He has an ET tube inserted, and NG tube, and various telemetry leads. Lungs: Fewer rhonchi than yesterday.Marland Kitchen Heart: S1, S2. No murmurs rubs or gallops. Abdomen: Positive bowel sounds, soft, nontender, nondistended. Extremities: Trace to 1+ bilateral upper extremity edema and resolution of pedal edema. Neurologic: Sedated on the ventilator.  Lab Results: Basic Metabolic Panel:  Basename 03/09/11 0412 03/08/11 0507 03/07/11 0453  NA 145 145 --  K 3.7 4.0 --  CL 108 109 --  CO2 33* 31 --  GLUCOSE 112* 193* --  BUN 38* 38* --  CREATININE 1.02 0.98 --  CALCIUM 8.2* 8.2* --  MG -- -- 2.3  PHOS -- -- --   Liver Function Tests:  Basename 03/07/11 0453  AST 21  ALT 30  ALKPHOS 38*  BILITOT 0.2*  PROT 5.7*  ALBUMIN 2.1*   No results found for this basename: LIPASE:2,AMYLASE:2 in the last 72 hours No results found for this basename: AMMONIA:2 in the last 72 hours CBC:  Basename 03/07/11 0453  WBC 11.7*  NEUTROABS --  HGB 9.8*  HCT 30.3*  MCV 100.3*  PLT 268   Cardiac Enzymes:  Basename 03/07/11 0453  CKTOTAL 475*  CKMB 1.7  CKMBINDEX --  TROPONINI 1.13*   BNP:  Basename 03/09/11 0413 03/08/11 0507  PROBNP 3890.0* 6773.0*   D-Dimer: No results found for this basename: DDIMER:2 in the last 72 hours CBG:  Basename 03/09/11 0726 03/09/11  0341 03/09/11 0009 03/08/11 1946 03/08/11 1702 03/08/11 1148  GLUCAP 68* 123* 141* 153* 167* 145*   Hemoglobin A1C:  Basename 03/06/11 0839  HGBA1C 5.8*   Fasting Lipid Panel: No results found for this basename: CHOL,HDL,LDLCALC,TRIG,CHOLHDL,LDLDIRECT in the last 72 hours Thyroid Function Tests: No results found for this basename: TSH,T4TOTAL,FREET4,T3FREE,THYROIDAB in the last 72 hours Anemia Panel: No results found for this basename: VITAMINB12,FOLATE,FERRITIN,TIBC,IRON,RETICCTPCT in the last 72 hours Coagulation: No results found for this basename: LABPROT:2,INR:2 in the last 72 hours Urine Drug Screen: Drugs of Abuse  No results found for this basename: labopia,  cocainscrnur,  labbenz,  amphetmu,  thcu,  labbarb    Alcohol Level: No results found for this basename: ETH:2 in the last 72 hours  Micro: Recent Results (from the past 240 hour(s))  CULTURE, BLOOD (ROUTINE X 2)     Status: Normal   Collection Time   03/03/11  3:05 PM      Component Value Range Status Comment   Specimen Description BLOOD RIGHT ANTECUBITAL   Final    Special Requests     Final    Value: BOTTLES DRAWN AEROBIC AND ANAEROBIC 6CC EACH BOTTLE   Culture NO GROWTH 5 DAYS   Final  Report Status 03/08/2011 FINAL   Final   CULTURE, BLOOD (ROUTINE X 2)     Status: Normal   Collection Time   03/03/11  4:31 PM      Component Value Range Status Comment   Specimen Description BLOOD RIGHT HAND   Final    Special Requests BOTTLES DRAWN AEROBIC ONLY 6CC BOTTLE   Final    Culture NO GROWTH 5 DAYS   Final    Report Status 03/08/2011 FINAL   Final   MRSA PCR SCREENING     Status: Normal   Collection Time   03/03/11  6:35 PM      Component Value Range Status Comment   MRSA by PCR NEGATIVE  NEGATIVE  Final   CULTURE, BLOOD (ROUTINE X 2)     Status: Normal (Preliminary result)   Collection Time   03/06/11  8:38 AM      Component Value Range Status Comment   Specimen Description Blood   Final    Special  Requests NONE 6CC   Final    Culture NO GROWTH 2 DAYS   Final    Report Status PENDING   Incomplete   CULTURE, BLOOD (ROUTINE X 2)     Status: Normal (Preliminary result)   Collection Time   03/06/11  8:56 AM      Component Value Range Status Comment   Specimen Description Blood LEFT ARM   Final    Special Requests NONE [facility]   Final    Culture NO GROWTH 2 DAYS   Final    Report Status PENDING   Incomplete     Studies/Results: Dg Chest Port 1 View  03/08/2011  *RADIOLOGY REPORT*  Clinical Data: Respiratory failure.  PORTABLE CHEST - 1 VIEW  Comparison: 03/07/2011  Findings: Endotracheal tube tip is 3.4 cm above the carina.  The patient is rotated to the right on today's exam, resulting in reduced diagnostic sensitivity and specificity.   Nasogastric tube extends into the stomach.  Right-sided PICC line tip is indistinct but is believed to terminate in the lower SVC.  It is partially obscured by a cardiac lead.  There is been interval partial clearing of the bilateral airspace opacities.  Diffuse interstitial opacity and patchy airspace opacities persist but are less dense than on yesterday's exam.  Thoracic spondylosis noted.  No cardiomegaly is observed.  IMPRESSION:  1.  Bilateral diffuse interstitial opacity with patchy airspace opacities which are less dense and less prominent than on yesterday's exam.  The appearance suggests mild improvement in atypical pneumonia or noncardiogenic edema.  Original Report Authenticated By: Dellia Cloud, M.D.    Medications: I have reviewed the patient's current medications.  Assessment: Principal Problem:  *Respiratory failure Active Problems:  CAP (community acquired pneumonia)  Toxic encephalopathy  Dehydration  Rhabdomyolysis  Benign hypertension  Hyperlipidemia  GERD (gastroesophageal reflux disease)  Chronic pain  Hypokalemia  Anemia  Elevated troponin I level  Hypernatremia  Hyperglycemia  Elevated LFTs  Non-ST elevation MI  (NSTEMI)  Pulmonary edema  1. Acute ventilator dependent respiratory failure secondary to community-acquired pneumonia. His ABG is noted. It appears to be improving. He is being treated with vancomycin, Zosyn, azithromycin, and Solu-Medrol, and bronchodilator therapy. His fever curve is improving. His blood cultures are negative x4 so far. Solu-Medrol has been decreased to once daily.  Probable superimposed pulmonary edema, given his elevated BNP. Will continue gentle Lasix daily. We'll watch his urine output and creatinine, and serum sodium to  Elevated cardiac  enzymes consistent with a non-ST elevation myocardial infarction. Aspirin and metoprolol were started. He remains on prophylactic Lovenox. His EKG on 03/06/2011 reveals normal sinus rhythm with a heart rate of 94 beats per minute, left axis deviation, and inferior/anterior lateral ST and T wave abnormalities.  Hypertension. His blood pressure is better on metoprolol.  He is on Lotensin and hydrochlorothiazide chronically at home.  Hypernatremia/dehydration. His serum sodium is better with the addition of free water via the tube. We'll continue to monitor and just IV fluids as needed.  Elevated liver transaminases. Resolved. This may have been secondary to rhabdomyolysis.   Hypokalemia. His blood magnesium level is within normal limits. His serum potassium is within normal limits with supplementation.  Steroid-induced hyperglycemia/secondary to tube feedings. His blood glucose is trending downward with the decrease in Solu-Medrol. Will therefore change sliding scale NovoLog to sensitive scale and decrease Lantus to 7 units twice a day. His hemoglobin A1c is 5.8.  Anemia. His hemoglobin was 12.7 on admission and has drifted downward. This may be secondary to acute and chronic disease, hemodilution, and venipuncture.  Chronic pain syndrome. He is on transdermal Duragesic chronically at home.  Plan:  Will continue Lasix IV daily. Will  decrease sliding scale NovoLog to the sensitive scale. We'll decrease Lantus to 7 units twice a day. We'll continue to monitor his serum sodium and urine output. Vent management per recommendations by Dr. Juanetta Gosling.       LOS: 6 days   Marcus [name] 03/09/2011, 8:18 AM

## 2011-03-09 NOTE — Consult Note (Signed)
ANTIBIOTIC CONSULT NOTE   Pharmacy Consult for Vancomycin Indication: pneumonia  No Known Allergies  Patient Measurements: Height: 5\' 4"  (162.6 cm) Weight: 162 lb 0.6 oz (73.5 kg) IBW/kg (Calculated) : 59.2   Vital Signs: Temp: 99.6 F (37.6 C) (12/31 0800) Temp src: Axillary (12/31 0800) BP: 183/67 mmHg (12/31 0854) Pulse Rate: 93  (12/31 0854) Intake/Output from previous day: 12/30 0701 - 12/31 0700 In: 3739.9 [I.V.:1517.9; NG/GT:1520; IV Piggyback:702] Out: 2150 [Urine:2150] Intake/Output from this shift: Total I/O In: 200 [I.V.:80; NG/GT:120] Out: -   Labs:  Basename 03/09/11 0412 03/08/11 0507 03/07/11 0453  WBC -- -- 11.7*  HGB -- -- 9.8*  PLT -- -- 268  LABCREA -- -- --  CREATININE 1.02 0.98 0.99   Estimated Creatinine Clearance: 60.1 ml/min (by C-G formula based on Cr of 1.02). No results found for this basename: VANCOTROUGH:2,VANCOPEAK:2,VANCORANDOM:2,GENTTROUGH:2,GENTPEAK:2,GENTRANDOM:2,TOBRATROUGH:2,TOBRAPEAK:2,TOBRARND:2,AMIKACINPEAK:2,AMIKACINTROU:2,AMIKACIN:2, in the last 72 hours  Microbiology: Recent Results (from the past 720 hour(s))  CULTURE, BLOOD (ROUTINE X 2)     Status: Normal   Collection Time   03/03/11  3:05 PM      Component Value Range Status Comment   Specimen Description BLOOD RIGHT ANTECUBITAL   Final    Special Requests     Final    Value: BOTTLES DRAWN AEROBIC AND ANAEROBIC 6CC EACH BOTTLE   Culture NO GROWTH 5 DAYS   Final    Report Status 03/08/2011 FINAL   Final   CULTURE, BLOOD (ROUTINE X 2)     Status: Normal   Collection Time   03/03/11  4:31 PM      Component Value Range Status Comment   Specimen Description BLOOD RIGHT HAND   Final    Special Requests BOTTLES DRAWN AEROBIC ONLY 6CC BOTTLE   Final    Culture NO GROWTH 5 DAYS   Final    Report Status 03/08/2011 FINAL   Final   MRSA PCR SCREENING     Status: Normal   Collection Time   03/03/11  6:35 PM      Component Value Range Status Comment   MRSA by PCR NEGATIVE   NEGATIVE  Final   CULTURE, BLOOD (ROUTINE X 2)     Status: Normal (Preliminary result)   Collection Time   03/06/11  8:38 AM      Component Value Range Status Comment   Specimen Description Blood   Final    Special Requests NONE 6CC   Final    Culture NO GROWTH 3 DAYS   Final    Report Status PENDING   Incomplete   CULTURE, BLOOD (ROUTINE X 2)     Status: Normal (Preliminary result)   Collection Time   03/06/11  8:56 AM      Component Value Range Status Comment   Specimen Description Blood LEFT ARM   Final    Special Requests NONE 6CC   Final    Culture NO GROWTH 3 DAYS   Final    Report Status PENDING   Incomplete    Medical History: Past Medical History  Diagnosis Date  . Hypertension   . GERD (gastroesophageal reflux disease)   . Hypercholesteremia   . Stroke   . Anemia 03/06/2011  . Smoker   . Non-ST elevation MI (NSTEMI) 03/07/2011    With PNA and resp failure   Medications:  Scheduled:     . albuterol  2.5 mg Nebulization Q6H  . antiseptic oral rinse  1 application Mouth Rinse QID  . aspirin  81  mg Oral Daily  . azithromycin  500 mg Intravenous Q24H  . chlorhexidine  15 mL Mouth/Throat BID  . enoxaparin  40 mg Subcutaneous Q24H  . free water  200 mL Per Tube Q6H  . furosemide  10 mg Intravenous Q12H  . furosemide  20 mg Intravenous Daily  . insulin aspart  0-9 Units Subcutaneous Q4H  . insulin glargine  7 Units Subcutaneous BID  . ipratropium  0.5 mg Nebulization Q6H  . methylPREDNISolone (SOLU-MEDROL) injection  60 mg Intravenous Q24H  . metoprolol tartrate  12.5 mg Oral BID  . pantoprazole sodium  40 mg Per Tube Q1200  . piperacillin-tazobactam (ZOSYN)  IV  3.375 g Intravenous Q8H  . potassium chloride  20 mEq Oral BID  . sodium chloride  10 mL Intracatheter Q12H  . vancomycin  750 mg Intravenous Q12H  . DISCONTD: insulin aspart  0-20 Units Subcutaneous Q4H  . DISCONTD: insulin glargine  15 Units Subcutaneous BID   Assessment: Trough level on target  (14.7) Anticipate some vancomycin accumulation Renal fxn stable  Goal of Therapy:  Vancomycin trough level 15-20 mcg/ml  Plan: Continue Vancomycin 750mg  iv q12hrs Labs per protocol  Valrie Hart A 03/09/2011,12:14 PM

## 2011-03-09 NOTE — Progress Notes (Signed)
Subjective: He remains intubated and on the ventilator. He was switched to the ARDS protocol yesterday. He also had a very elevated proBNP yesterday has been started on Lasix. His abnormalities on chest x-ray could be progression of his multifocal pneumonia 2 ARDS or could be related to pulmonary edema so I think both treatments are appropriate at this point.  Objective: Vital signs in last 24 hours: Temp:  [99.4 F (37.4 C)-99.9 F (37.7 C)] 99.7 F (37.6 C) (12/31 0400) Pulse Rate:  [60-90] 69  (12/31 0645) Resp:  [14-23] 15  (12/31 0645) BP: (100-162)/(40-63) 131/53 mmHg (12/31 0718) SpO2:  [92 %-100 %] 99 % (12/31 0717) FiO2 (%):  [39.7 %-45 %] 40 % (12/31 0717) Weight:  [73.5 kg (162 lb 0.6 oz)] 162 lb 0.6 oz (73.5 kg) (12/31 0500) Weight change: 0.2 kg (7.1 oz) Last BM Date: 03/07/11  Intake/Output from previous day: 12/30 0701 - 12/31 0700 In: 3639.9 [I.V.:1477.9; NG/GT:1460; IV Piggyback:702] Out: 2150 [Urine:2150]  PHYSICAL EXAM General appearance: Intubated and sedated Resp: rhonchi bilaterally Cardio: regular rate and rhythm, S1, S2 normal, no murmur, click, rub or gallop GI: soft, non-tender; bowel sounds normal; no masses,  no organomegaly Extremities: extremities normal, atraumatic, no cyanosis or edema  Lab Results:    Basic Metabolic Panel:  Basename 03/09/11 0412 03/08/11 0507 03/07/11 0453  NA 145 145 --  K 3.7 4.0 --  CL 108 109 --  CO2 33* 31 --  GLUCOSE 112* 193* --  BUN 38* 38* --  CREATININE 1.02 0.98 --  CALCIUM 8.2* 8.2* --  MG -- -- 2.3  PHOS -- -- --   Liver Function Tests:  Basename 03/07/11 0453  AST 21  ALT 30  ALKPHOS 38*  BILITOT 0.2*  PROT 5.7*  ALBUMIN 2.1*   No results found for this basename: LIPASE:2,AMYLASE:2 in the last 72 hours No results found for this basename: AMMONIA:2 in the last 72 hours CBC:  Basename 03/07/11 0453  WBC 11.7*  NEUTROABS --  HGB 9.8*  HCT 30.3*  MCV 100.3*  PLT 268   Cardiac  Enzymes:  Basename 03/07/11 0453  CKTOTAL 475*  CKMB 1.7  CKMBINDEX --  TROPONINI 1.13*   BNP:  Basename 03/09/11 0413 03/08/11 0507  PROBNP 3890.0* 6773.0*   D-Dimer: No results found for this basename: DDIMER:2 in the last 72 hours CBG:  Basename 03/09/11 0341 03/09/11 0009 03/08/11 1946 03/08/11 1702 03/08/11 1148 03/08/11 0750  GLUCAP 123* 141* 153* 167* 145* 175*   Hemoglobin A1C:  Basename 03/06/11 0839  HGBA1C 5.8*   Fasting Lipid Panel: No results found for this basename: CHOL,HDL,LDLCALC,TRIG,CHOLHDL,LDLDIRECT in the last 72 hours Thyroid Function Tests: No results found for this basename: TSH,T4TOTAL,FREET4,T3FREE,THYROIDAB in the last 72 hours Anemia Panel: No results found for this basename: VITAMINB12,FOLATE,FERRITIN,TIBC,IRON,RETICCTPCT in the last 72 hours Coagulation: No results found for this basename: LABPROT:2,INR:2 in the last 72 hours Urine Drug Screen: Drugs of Abuse  No results found for this basename: labopia, cocainscrnur, labbenz, amphetmu, thcu, labbarb    Alcohol Level: No results found for this basename: ETH:2 in the last 72 hours Urinalysis:  Misc. Labs:  ABGS  Basename 03/08/11 1130  PHART 7.445  PO2ART 96.1  TCO2 27.1  HCO3 29.3*   CULTURES Recent Results (from the past 240 hour(s))  CULTURE, BLOOD (ROUTINE X 2)     Status: Normal   Collection Time   03/03/11  3:05 PM      Component Value Range Status Comment   Specimen  Description BLOOD RIGHT ANTECUBITAL   Final    Special Requests     Final    Value: BOTTLES DRAWN AEROBIC AND ANAEROBIC 6CC EACH BOTTLE   Culture NO GROWTH 5 DAYS   Final    Report Status 03/08/2011 FINAL   Final   CULTURE, BLOOD (ROUTINE X 2)     Status: Normal   Collection Time   03/03/11  4:31 PM      Component Value Range Status Comment   Specimen Description BLOOD RIGHT HAND   Final    Special Requests BOTTLES DRAWN AEROBIC ONLY 6CC BOTTLE   Final    Culture NO GROWTH 5 DAYS   Final    Report  Status 03/08/2011 FINAL   Final   MRSA PCR SCREENING     Status: Normal   Collection Time   03/03/11  6:35 PM      Component Value Range Status Comment   MRSA by PCR NEGATIVE  NEGATIVE  Final   CULTURE, BLOOD (ROUTINE X 2)     Status: Normal (Preliminary result)   Collection Time   03/06/11  8:38 AM      Component Value Range Status Comment   Specimen Description Blood   Final    Special Requests NONE 6CC   Final    Culture NO GROWTH 2 DAYS   Final    Report Status PENDING   Incomplete   CULTURE, BLOOD (ROUTINE X 2)     Status: Normal (Preliminary result)   Collection Time   03/06/11  8:56 AM      Component Value Range Status Comment   Specimen Description Blood LEFT ARM   Final    Special Requests NONE [facility]   Final    Culture NO GROWTH 2 DAYS   Final    Report Status PENDING   Incomplete    Studies/Results: Dg Chest Port 1 View  03/08/2011  *RADIOLOGY REPORT*  Clinical Data: Respiratory failure.  PORTABLE CHEST - 1 VIEW  Comparison: 03/07/2011  Findings: Endotracheal tube tip is 3.4 cm above the carina.  The patient is rotated to the right on today's exam, resulting in reduced diagnostic sensitivity and specificity.   Nasogastric tube extends into the stomach.  Right-sided PICC line tip is indistinct but is believed to terminate in the lower SVC.  It is partially obscured by a cardiac lead.  There is been interval partial clearing of the bilateral airspace opacities.  Diffuse interstitial opacity and patchy airspace opacities persist but are less dense than on yesterday's exam.  Thoracic spondylosis noted.  No cardiomegaly is observed.  IMPRESSION:  1.  Bilateral diffuse interstitial opacity with patchy airspace opacities which are less dense and less prominent than on yesterday's exam.  The appearance suggests mild improvement in atypical pneumonia or noncardiogenic edema.  Original Report Authenticated By: Dellia Cloud, M.D.    Medications:  Scheduled:   . albuterol  2.5 mg  Nebulization Q6H  . antiseptic oral rinse  1 application Mouth Rinse QID  . aspirin  81 mg Oral Daily  . azithromycin  500 mg Intravenous Q24H  . chlorhexidine  15 mL Mouth/Throat BID  . enoxaparin  40 mg Subcutaneous Q24H  . free water  200 mL Per Tube Q6H  . furosemide  10 mg Intravenous Q12H  . insulin aspart  0-20 Units Subcutaneous Q4H  . insulin glargine  15 Units Subcutaneous BID  . ipratropium  0.5 mg Nebulization Q6H  . methylPREDNISolone (SOLU-MEDROL) injection  60  mg Intravenous Q24H  . metoprolol tartrate  12.5 mg Oral BID  . pantoprazole sodium  40 mg Per Tube Q1200  . piperacillin-tazobactam (ZOSYN)  IV  3.375 g Intravenous Q8H  . potassium chloride  20 mEq Oral BID  . sodium chloride  10 mL Intracatheter Q12H  . vancomycin  750 mg Intravenous Q12H   Continuous:   . dextrose 5 % with KCl 20 mEq / [name] 20 mEq (03/09/11 0600)  . feeding supplement (JEVITY 1.2) 1,000 mL (03/07/11 1546)  . fentaNYL infusion INTRAVENOUS 150 mcg/hr (03/09/11 0600)  . midazolam (VERSED) infusion 4 mg/hr (03/09/11 0600)   ZOX:[id number], ondansetron (ZOFRAN) IV, ondansetron, sodium chloride  Assesment: He was admitted with multi-focal pneumonia. He is being treated for that. He has respiratory failure related to the pneumonia. He said a toxic encephalopathy. His advice on chest x-ray have progressed and may be related to ARDS or perhaps to pulmonary edema. He has been switched to the ARDS protocol and he has started on Lasix. His blood gases improved somewhat Principal Problem:  *Respiratory failure Active Problems:  CAP (community acquired pneumonia)  Toxic encephalopathy  Dehydration  Rhabdomyolysis  Benign hypertension  Hyperlipidemia  GERD (gastroesophageal reflux disease)  Chronic pain  Hypokalemia  Anemia  Elevated troponin I level  Hypernatremia  Hyperglycemia  Elevated LFTs  Non-ST elevation MI (NSTEMI)    Plan: Continue with current treatments including Lasix etc.  continue with ventilator support.    LOS: 6 days   Marcus [name] 03/09/2011, 7:57 AM

## 2011-03-10 ENCOUNTER — Inpatient Hospital Stay (HOSPITAL_COMMUNITY): Payer: Medicare Other

## 2011-03-10 LAB — BLOOD GAS, ARTERIAL
Bicarbonate: 29.7 mEq/L — ABNORMAL HIGH (ref 20.0–24.0)
FIO2: 40 %
TCO2: 26.9 mmol/L (ref 0–100)
pCO2 arterial: 43.9 mmHg (ref 35.0–45.0)
pH, Arterial: 7.445 (ref 7.350–7.450)
pO2, Arterial: 137 mmHg — ABNORMAL HIGH (ref 80.0–100.0)

## 2011-03-10 LAB — CBC
HCT: 27.5 % — ABNORMAL LOW (ref 39.0–52.0)
Hemoglobin: 9.1 g/dL — ABNORMAL LOW (ref 13.0–17.0)
MCV: 97.9 fL (ref 78.0–100.0)
RBC: 2.81 MIL/uL — ABNORMAL LOW (ref 4.22–5.81)
WBC: 17.4 10*3/uL — ABNORMAL HIGH (ref 4.0–10.5)

## 2011-03-10 LAB — GLUCOSE, CAPILLARY
Glucose-Capillary: 116 mg/dL — ABNORMAL HIGH (ref 70–99)
Glucose-Capillary: 116 mg/dL — ABNORMAL HIGH (ref 70–99)
Glucose-Capillary: 172 mg/dL — ABNORMAL HIGH (ref 70–99)
Glucose-Capillary: 186 mg/dL — ABNORMAL HIGH (ref 70–99)

## 2011-03-10 LAB — BASIC METABOLIC PANEL
Chloride: 100 mEq/L (ref 96–112)
Creatinine, Ser: 0.82 mg/dL (ref 0.50–1.35)
GFR calc Af Amer: >90 mL/min (ref >90)

## 2011-03-10 MED ORDER — FENTANYL CITRATE 0.05 MG/ML IJ SOLN
INTRAMUSCULAR | Status: AC
  Filled 2011-03-10: qty 50

## 2011-03-10 NOTE — Progress Notes (Signed)
Subjective: He remains intubated and sedated. He has had some problems in is not responding as well and he is being checked to see if these had some sort of an intracranial event.  Objective: Vital signs in last 24 hours: Temp:  [97.8 F (36.6 C)-99.3 F (37.4 C)] 99 F (37.2 C) (01/01 0800) Pulse Rate:  [66-92] 92  (01/01 0800) Resp:  [15-24] 15  (01/01 0800) BP: (98-202)/(47-82) 132/74 mmHg (01/01 0700) SpO2:  [90 %-100 %] 95 % (01/01 0800) FiO2 (%):  [39.7 %-40.3 %] 40.2 % (01/01 0800) Weight:  [70 kg (154 lb 5.2 oz)] 154 lb 5.2 oz (70 kg) (01/01 0600) Weight change: -3.5 kg (-7 lb 11.5 oz) Last BM Date: 03/10/11  Intake/Output from previous day: 12/31 0701 - 01/01 0700 In: 3196.6 [I.V.:1444.6; NG/GT:1500; IV Piggyback:252] Out: 3350 [Urine:3350]  PHYSICAL EXAM General appearance: Intubated sedated and not cooperating Resp: rhonchi bilaterally Cardio: regular rate and rhythm, S1, S2 normal, no murmur, click, rub or gallop GI: soft, non-tender; bowel sounds normal; no masses,  no organomegaly Extremities: extremities normal, atraumatic, no cyanosis or edema  Lab Results:    Basic Metabolic Panel:  Basename 03/10/11 0636 03/09/11 0412  NA 135 145  K 4.3 3.7  CL 100 108  CO2 31 33*  GLUCOSE 252* 112*  BUN 31* 38*  CREATININE 0.82 1.02  CALCIUM 8.3* 8.2*  MG -- --  PHOS -- --   Liver Function Tests: No results found for this basename: AST:2,ALT:2,ALKPHOS:2,BILITOT:2,PROT:2,ALBUMIN:2 in the last 72 hours No results found for this basename: LIPASE:2,AMYLASE:2 in the last 72 hours No results found for this basename: AMMONIA:2 in the last 72 hours CBC:  Basename 03/10/11 0636  WBC 17.4*  NEUTROABS --  HGB 9.1*  HCT 27.5*  MCV 97.9  PLT 343   Cardiac Enzymes: No results found for this basename: CKTOTAL:3,CKMB:3,CKMBINDEX:3,TROPONINI:3 in the last 72 hours BNP:  Basename 03/09/11 0413 03/08/11 0507  PROBNP 3890.0* 6773.0*   D-Dimer: No results found for  this basename: DDIMER:2 in the last 72 hours CBG:  Basename 03/10/11 0752 03/10/11 0413 03/10/11 0033 03/09/11 2018 03/09/11 1618 03/09/11 1124  GLUCAP 168* 116* 116* 137* 140* 141*   Hemoglobin A1C: No results found for this basename: HGBA1C in the last 72 hours Fasting Lipid Panel: No results found for this basename: CHOL,HDL,LDLCALC,TRIG,CHOLHDL,LDLDIRECT in the last 72 hours Thyroid Function Tests: No results found for this basename: TSH,T4TOTAL,FREET4,T3FREE,THYROIDAB in the last 72 hours Anemia Panel: No results found for this basename: VITAMINB12,FOLATE,FERRITIN,TIBC,IRON,RETICCTPCT in the last 72 hours Coagulation: No results found for this basename: LABPROT:2,INR:2 in the last 72 hours Urine Drug Screen: Drugs of Abuse  No results found for this basename: labopia, cocainscrnur, labbenz, amphetmu, thcu, labbarb    Alcohol Level: No results found for this basename: ETH:2 in the last 72 hours Urinalysis:  Misc. Labs:  ABGS  Basename 03/10/11 0508  PHART 7.445  PO2ART 137.0*  TCO2 26.9  HCO3 29.7*   CULTURES Recent Results (from the past 240 hour(s))  CULTURE, BLOOD (ROUTINE X 2)     Status: Normal   Collection Time   03/03/11  3:05 PM      Component Value Range Status Comment   Specimen Description BLOOD RIGHT ANTECUBITAL   Final    Special Requests     Final    Value: BOTTLES DRAWN AEROBIC AND ANAEROBIC 6CC EACH BOTTLE   Culture NO GROWTH 5 DAYS   Final    Report Status 03/08/2011 FINAL   Final  CULTURE, BLOOD (ROUTINE X 2)     Status: Normal   Collection Time   03/03/11  4:31 PM      Component Value Range Status Comment   Specimen Description BLOOD RIGHT HAND   Final    Special Requests BOTTLES DRAWN AEROBIC ONLY 6CC BOTTLE   Final    Culture NO GROWTH 5 DAYS   Final    Report Status 03/08/2011 FINAL   Final   MRSA PCR SCREENING     Status: Normal   Collection Time   03/03/11  6:35 PM      Component Value Range Status Comment   MRSA by PCR NEGATIVE   NEGATIVE  Final   CULTURE, BLOOD (ROUTINE X 2)     Status: Normal (Preliminary result)   Collection Time   03/06/11  8:38 AM      Component Value Range Status Comment   Specimen Description BLOOD SITE NOT SPECIFIED DRAWN BY RN   Final    Special Requests BOTTLES DRAWN AEROBIC AND ANAEROBIC 6CC   Final    Culture NO GROWTH 4 DAYS   Final    Report Status PENDING   Incomplete   CULTURE, BLOOD (ROUTINE X 2)     Status: Normal (Preliminary result)   Collection Time   03/06/11  8:56 AM      Component Value Range Status Comment   Specimen Description BLOOD LEFT ARM   Final    Special Requests BOTTLES DRAWN AEROBIC AND ANAEROBIC 6CC   Final    Culture NO GROWTH 4 DAYS   Final    Report Status PENDING   Incomplete    Studies/Results: Dg Chest Port 1 View  03/10/2011  *RADIOLOGY REPORT*  Clinical Data: Respiratory failure  PORTABLE CHEST - 1 VIEW  Comparison: 03/08/2011, 03/07/2011, 03/03/2011.  Findings: The endotracheal tube, is approximately 1.5 cm above the carina.  Right upper extremity PICC terminates near the junction of the superior vena cava right atrium.  Cardiomediastinal silhouette is stable, with cardiomegaly suspected.  The lung volumes are low bilaterally.  There is diffuse interstitial prominence, and patchy airspace disease at both lung bases and in the perihilar regions.  Aeration of the lungs has improved compared to 03/08/2011. No visible pleural effusion.  IMPRESSION:  1. Improving aeration of the lungs in this patient with bibasilar airspace disease and interstitial prominence. 2.  Satisfactory position of support devices.  Original Report Authenticated By: Britta Mccreedy, M.D.    Medications:  Scheduled:   . albuterol  2.5 mg Nebulization Q6H  . antiseptic oral rinse  1 application Mouth Rinse QID  . aspirin  81 mg Oral Daily  . azithromycin  500 mg Intravenous Q24H  . chlorhexidine  15 mL Mouth/Throat BID  . enoxaparin  40 mg Subcutaneous Q24H  . free water  200 mL Per  Tube Q6H  . furosemide  20 mg Intravenous Daily  . insulin aspart  0-9 Units Subcutaneous Q4H  . insulin glargine  7 Units Subcutaneous BID  . ipratropium  0.5 mg Nebulization Q6H  . methylPREDNISolone (SOLU-MEDROL) injection  60 mg Intravenous Q24H  . metoprolol tartrate  12.5 mg Oral BID  . pantoprazole sodium  40 mg Per Tube Q1200  . piperacillin-tazobactam (ZOSYN)  IV  3.375 g Intravenous Q8H  . potassium chloride  20 mEq Oral BID  . sodium chloride  10 mL Intracatheter Q12H  . vancomycin  750 mg Intravenous Q12H   Continuous:   . dextrose 5 % with KCl  20 mEq / L 20 mEq (03/10/11 0800)  . feeding supplement (JEVITY 1.2) 1,000 mL (03/10/11 0259)  . fentaNYL infusion INTRAVENOUS 100 mcg/hr (03/10/11 0800)  . midazolam (VERSED) infusion 4 mg/hr (03/10/11 2130)   QMV:[id number], ondansetron (ZOFRAN) IV, ondansetron, sodium chloride  Assesment: He has acute respiratory failure. He may have had some sort of a stroke. His chest x-ray and oxygenation are getting better but I don't think we can do anything about try to get him extubated since he is not cooperating Principal Problem:  *Respiratory failure Active Problems:  CAP (community acquired pneumonia)  Toxic encephalopathy  Dehydration  Rhabdomyolysis  Benign hypertension  Hyperlipidemia  GERD (gastroesophageal reflux disease)  Chronic pain  Hypokalemia  Anemia  Elevated troponin I level  Hypernatremia  Hyperglycemia  Elevated LFTs  Non-ST elevation MI (NSTEMI)  Pulmonary edema    Plan: He's going to have a CT of the brain continue his other treatments and follow    LOS: 7 days   Lashonda Sonneborn L 03/10/2011, 10:25 AM

## 2011-03-10 NOTE — Progress Notes (Signed)
Subjective:  The sedation has been turned off with exception of fentanyl. The respiratory therapist and nursing staff have noticed that he has not been following directions. He has also been demonstrating some posturing.  Objective: Vital signs in last 24 hours: Filed Vitals:   03/10/11 0500 03/10/11 0600 03/10/11 0700 03/10/11 0800  BP: 112/53 115/64 132/74   Pulse: 69 73 86 92  Temp:    99 F (37.2 C)  TempSrc:      Resp: 16 17 19 15   Height:      Weight:  70 kg (154 lb 5.2 oz)    SpO2: 100% 98% 99% 95%    Intake/Output Summary (Last 24 hours) at 03/10/11 0802 Last data filed at 03/10/11 0600  Gross per 24 hour  Intake 2888.8 ml  Output   3350 ml  Net -461.2 ml    Weight change: -3.5 kg (-7 lb 11.5 oz)  Physical exam: General: [name] sitting up on the ventilator. He has an ET tube inserted, and NG tube, and various telemetry leads. Lungs: Few rhonchi bilaterally Heart: S1, S2. No murmurs rubs or gallops. Abdomen: Positive bowel sounds, soft, nontender, nondistended. Extremities: Trace to 1+ bilateral upper extremity edema and resolution of pedal edema. Neurologic: He is sitting up. His eyes are open. It does not appear that he has a facial droop although it is difficult to tell with the ET tube and NG tube inserted. He has corneal reflex bilaterally. He does not respond to his name. He does not provide eye contact. He shrugs his shoulder once. He does not withdraw with stimuli. Mildly upgoing toes per plantar reflexes.  Lab Results: Basic Metabolic Panel:  Basename 03/09/11 0412 03/08/11 0507  NA 145 145  K 3.7 4.0  CL 108 109  CO2 33* 31  GLUCOSE 112* 193*  BUN 38* 38*  CREATININE 1.02 0.98  CALCIUM 8.2* 8.2*  MG -- --  PHOS -- --   Liver Function Tests: No results found for this basename: AST:2,ALT:2,ALKPHOS:2,BILITOT:2,PROT:2,ALBUMIN:2 in the last 72 hours No results found for this basename: LIPASE:2,AMYLASE:2 in the last 72 hours No  results found for this basename: AMMONIA:2 in the last 72 hours CBC:  Basename 03/10/11 0636  WBC 17.4*  NEUTROABS --  HGB 9.1*  HCT 27.5*  MCV 97.9  PLT 343   Cardiac Enzymes: No results found for this basename: CKTOTAL:3,CKMB:3,CKMBINDEX:3,TROPONINI:3 in the last 72 hours BNP:  Basename 03/09/11 0413 03/08/11 0507  PROBNP 3890.0* 6773.0*   D-Dimer: No results found for this basename: DDIMER:2 in the last 72 hours CBG:  Basename 03/10/11 0413 03/10/11 0033 03/09/11 2018 03/09/11 1618 03/09/11 1124 03/09/11 0726  GLUCAP 116* 116* 137* 140* 141* 68*   Hemoglobin A1C: No results found for this basename: HGBA1C in the last 72 hours Fasting Lipid Panel: No results found for this basename: CHOL,HDL,LDLCALC,TRIG,CHOLHDL,LDLDIRECT in the last 72 hours Thyroid Function Tests: No results found for this basename: TSH,T4TOTAL,FREET4,T3FREE,THYROIDAB in the last 72 hours Anemia Panel: No results found for this basename: VITAMINB12,FOLATE,FERRITIN,TIBC,IRON,RETICCTPCT in the last 72 hours Coagulation: No results found for this basename: LABPROT:2,INR:2 in the last 72 hours Urine Drug Screen: Drugs of Abuse  No results found for this basename: labopia,  cocainscrnur,  labbenz,  amphetmu,  thcu,  labbarb    Alcohol Level: No results found for this basename: ETH:2 in the last 72 hours  Micro: Recent Results (from the past 240 hour(s))  CULTURE, BLOOD (ROUTINE X 2)     Status: Normal  Collection Time   03/03/11  3:05 PM      Component Value Range Status Comment   Specimen Description BLOOD RIGHT ANTECUBITAL   Final    Special Requests     Final    Value: BOTTLES DRAWN AEROBIC AND ANAEROBIC 6CC EACH BOTTLE   Culture NO GROWTH 5 DAYS   Final    Report Status 03/08/2011 FINAL   Final   CULTURE, BLOOD (ROUTINE X 2)     Status: Normal   Collection Time   03/03/11  4:31 PM      Component Value Range Status Comment   Specimen Description BLOOD RIGHT HAND   Final    Special Requests  BOTTLES DRAWN AEROBIC ONLY 6CC BOTTLE   Final    Culture NO GROWTH 5 DAYS   Final    Report Status 03/08/2011 FINAL   Final   MRSA PCR SCREENING     Status: Normal   Collection Time   03/03/11  6:35 PM      Component Value Range Status Comment   MRSA by PCR NEGATIVE  NEGATIVE  Final   CULTURE, BLOOD (ROUTINE X 2)     Status: Normal (Preliminary result)   Collection Time   03/06/11  8:38 AM      Component Value Range Status Comment   Specimen Description BLOOD SITE NOT SPECIFIED DRAWN BY RN   Final    Special Requests BOTTLES DRAWN AEROBIC AND ANAEROBIC 6CC   Final    Culture NO GROWTH 4 DAYS   Final    Report Status PENDING   Incomplete   CULTURE, BLOOD (ROUTINE X 2)     Status: Normal (Preliminary result)   Collection Time   03/06/11  8:56 AM      Component Value Range Status Comment   Specimen Description BLOOD LEFT ARM   Final    Special Requests BOTTLES DRAWN AEROBIC AND ANAEROBIC 6CC   Final    Culture NO GROWTH 4 DAYS   Final    Report Status PENDING   Incomplete     Studies/Results: No results found.  Medications: I have reviewed the patient's current medications.  Assessment: Principal Problem:  *Respiratory failure Active Problems:  CAP (community acquired pneumonia)  Toxic encephalopathy  Dehydration  Rhabdomyolysis  Benign hypertension  Hyperlipidemia  GERD (gastroesophageal reflux disease)  Chronic pain  Hypokalemia  Anemia  Elevated troponin I level  Hypernatremia  Hyperglycemia  Elevated LFTs  Non-ST elevation MI (NSTEMI)  Pulmonary edema  1. Acute ventilator dependent respiratory failure secondary to community-acquired pneumonia. His ABG is noted. It has been improving. Dr. Juanetta [name] is assisting with ventilator management. He is being treated with vancomycin, Zosyn, azithromycin, and Solu-Medrol, and bronchodilator therapy. His fever curve has improved. His white count has increased, but this could be secondary to ongoing steroid therapy. His blood  cultures are negative x4 so far. Solu-Medrol has been decreased to once daily.  Toxic metabolic encephalopathy. Given his slow resolution and neurological changes, we'll order a CT scan of his head and consult neurology.  Probable superimposed pulmonary edema, given his elevated BNP. BNP has progressively decreased with gentle Lasix daily. We'll watch his urine output and creatinine, and serum sodium.  Elevated cardiac enzymes consistent with a non-ST elevation myocardial infarction. Aspirin and metoprolol were started. He remains on prophylactic Lovenox. His EKG on 03/06/2011 revealed normal sinus rhythm with a heart rate of 94 beats per minute, left axis deviation, and inferior/anterior lateral ST and T wave abnormalities.  Hypertension. His blood pressure is better on metoprolol.  He is on Lotensin and hydrochlorothiazide chronically at home.  Hypernatremia/dehydration. His serum sodium is better with the addition of free water via the tube. We'll continue to monitor and just IV fluids as needed.  Elevated liver transaminases. Resolved. This may have been secondary to rhabdomyolysis.   Hypokalemia. His blood magnesium level is within normal limits. His serum potassium is within normal limits with supplementation.  Steroid-induced hyperglycemia/secondary to tube feedings. His blood glucose is trending downward with the decrease in Solu-Medrol. Will therefore change sliding scale NovoLog to sensitive scale and decrease Lantus to 7 units twice a day. His hemoglobin A1c is 5.8.  Anemia. His hemoglobin was 12.7 on admission and has drifted downward. This may be secondary to acute and chronic disease, hemodilution, and venipuncture.  Chronic pain syndrome. He is on transdermal Duragesic chronically at home.  Plan:  We'll order a CT scan of his head to rule out any acute intracranial abnormalities. [facility] consult neurologist, Dr. Gerilyn Pilgrim. Will check the results of the be made pending. Will  adjust fluids and Lasix accordingly.  Vent management per recommendations by Dr. Juanetta [name].       LOS: 7 days   Marcus [name] 03/10/2011, 8:02 AM

## 2011-03-10 NOTE — Consult Note (Signed)
Reason for Consult: Referring Physician:   [name] is an 71 y.o. male.  HPI: see dictation   Past Medical History  Diagnosis Date  . Hypertension   . GERD (gastroesophageal reflux disease)   . Hypercholesteremia   . Stroke   . Anemia 03/06/2011  . Smoker   . Non-ST elevation MI (NSTEMI) 03/07/2011    With PNA and resp failure    History reviewed. No pertinent past surgical history.  History reviewed. No pertinent family history.  Social History:  does not have a smoking history on file. He does not have any smokeless tobacco history on file. His alcohol and drug histories not on file.  Allergies: No Known Allergies  Medications:  Prior to Admission medications   Medication Sig Start Date End Date Taking? Authorizing Provider  benazepril (LOTENSIN) 10 MG tablet Take 10 mg by mouth at bedtime.      Historical Provider, MD  Choline Fenofibrate (TRILIPIX) 45 MG capsule Take 45 mg by mouth daily.      Historical Provider, MD  fentaNYL (DURAGESIC - DOSED MCG/HR) 100 MCG/HR Place 1 patch onto the skin every 3 (three) days. Remove and discard used patches     Historical Provider, MD  fluticasone (FLONASE) 50 MCG/ACT nasal spray Place 2 sprays into the nose daily.      Historical Provider, MD  gabapentin (NEURONTIN) 400 MG capsule Take 400 mg by mouth 2 (two) times daily.      Historical Provider, MD  hydrochlorothiazide (HYDRODIURIL) 25 MG tablet Take 25 mg by mouth every morning.      Historical Provider, MD  HYDROcodone-acetaminophen (NORCO) 10-325 MG per tablet Take 1 tablet by mouth 3 (three) times daily as needed. For pain     Historical Provider, MD  omeprazole (PRILOSEC) 40 MG capsule Take 40 mg by mouth daily.      Historical Provider, MD  sertraline (ZOLOFT) 100 MG tablet Take 100 mg by mouth daily.      Historical Provider, MD  simvastatin (ZOCOR) 40 MG tablet Take 40 mg by mouth daily.      Historical Provider, MD    Scheduled Meds:   . albuterol  2.5 mg  Nebulization Q6H  . antiseptic oral rinse  1 application Mouth Rinse QID  . aspirin  81 mg Oral Daily  . azithromycin  500 mg Intravenous Q24H  . chlorhexidine  15 mL Mouth/Throat BID  . enoxaparin  40 mg Subcutaneous Q24H  . free water  200 mL Per Tube Q6H  . furosemide  20 mg Intravenous Daily  . insulin aspart  0-9 Units Subcutaneous Q4H  . insulin glargine  7 Units Subcutaneous BID  . ipratropium  0.5 mg Nebulization Q6H  . methylPREDNISolone (SOLU-MEDROL) injection  60 mg Intravenous Q24H  . metoprolol tartrate  12.5 mg Oral BID  . pantoprazole sodium  40 mg Per Tube Q1200  . piperacillin-tazobactam (ZOSYN)  IV  3.375 g Intravenous Q8H  . potassium chloride  20 mEq Oral BID  . sodium chloride  10 mL Intracatheter Q12H  . vancomycin  750 mg Intravenous Q12H   Continuous Infusions:   . dextrose 5 % with KCl 20 mEq / L 20 mEq (03/10/11 2010)  . feeding supplement (JEVITY 1.2) 1,000 mL (03/10/11 0259)  . fentaNYL infusion INTRAVENOUS 100 mcg/hr (03/10/11 2010)  . midazolam (VERSED) infusion 4 mg/hr (03/10/11 2010)   PRN Meds:.acetaminophen, ondansetron (ZOFRAN) IV, ondansetron, sodium chloride  Results for orders placed during the hospital encounter of 03/03/11 (  from the past 48 hour(s))  GLUCOSE, CAPILLARY     Status: Abnormal   Collection Time   03/09/11 12:09 AM      Component Value Range Comment   Glucose-Capillary 141 (*) 70 - 99 (mg/dL)    Comment 1 Notify RN     GLUCOSE, CAPILLARY     Status: Abnormal   Collection Time   03/09/11  3:41 AM      Component Value Range Comment   Glucose-Capillary 123 (*) 70 - 99 (mg/dL)    Comment 1 Notify RN     BASIC METABOLIC PANEL     Status: Abnormal   Collection Time   03/09/11  4:12 AM      Component Value Range Comment   Sodium 145  135 - 145 (mEq/L)    Potassium 3.7  3.5 - 5.1 (mEq/L)    Chloride 108  96 - 112 (mEq/L)    CO2 33 (*) 19 - 32 (mEq/L)    Glucose, Bld 112 (*) 70 - 99 (mg/dL)    BUN 38 (*) 6 - 23 (mg/dL)     Creatinine, Ser 7.42  0.50 - 1.35 (mg/dL)    Calcium 8.2 (*) 8.4 - 10.5 (mg/dL)    GFR calc non Af Amer 71 (*) >90 (mL/min)    GFR calc Af Amer 83 (*) >90 (mL/min)   PRO B NATRIURETIC PEPTIDE     Status: Abnormal   Collection Time   03/09/11  4:13 AM      Component Value Range Comment   Pro B Natriuretic peptide (BNP) 3890.0 (*) 0 - 125 (pg/mL)   GLUCOSE, CAPILLARY     Status: Abnormal   Collection Time   03/09/11  7:26 AM      Component Value Range Comment   Glucose-Capillary 68 (*) 70 - 99 (mg/dL)    Comment 1 Documented in Chart      Comment 2 Notify RN     GLUCOSE, CAPILLARY     Status: Abnormal   Collection Time   03/09/11 11:24 AM      Component Value Range Comment   Glucose-Capillary 141 (*) 70 - 99 (mg/dL)    Comment 1 Documented in Chart      Comment 2 Notify RN     GLUCOSE, CAPILLARY     Status: Abnormal   Collection Time   03/09/11  4:18 PM      Component Value Range Comment   Glucose-Capillary 140 (*) 70 - 99 (mg/dL)   GLUCOSE, CAPILLARY     Status: Abnormal   Collection Time   03/09/11  8:18 PM      Component Value Range Comment   Glucose-Capillary 137 (*) 70 - 99 (mg/dL)    Comment 1 Documented in Chart      Comment 2 Notify RN     GLUCOSE, CAPILLARY     Status: Abnormal   Collection Time   03/10/11 12:33 AM      Component Value Range Comment   Glucose-Capillary 116 (*) 70 - 99 (mg/dL)    Comment 1 Documented in Chart      Comment 2 Notify RN     GLUCOSE, CAPILLARY     Status: Abnormal   Collection Time   03/10/11  4:13 AM      Component Value Range Comment   Glucose-Capillary 116 (*) 70 - 99 (mg/dL)    Comment 1 Notify RN     BLOOD GAS, ARTERIAL     Status: Abnormal  Collection Time   03/10/11  5:08 AM      Component Value Range Comment   FIO2 40.00      O2 Content 40.0      Delivery systems VENTILATOR      Mode PRESSURE REGULATED VOLUME CONTROL      VT 470      Rate 16      Peep/cpap 5.0      pH, Arterial 7.445  7.350 - 7.450     pCO2 arterial  43.9  35.0 - 45.0 (mmHg)    pO2, Arterial 137.0 (*) 80.0 - 100.0 (mmHg)    Bicarbonate 29.7 (*) 20.0 - 24.0 (mEq/L)    TCO2 26.9  0 - 100 (mmol/L)    Acid-Base Excess 5.6 (*) 0.0 - 2.0 (mmol/L)    O2 Saturation 98.9      Collection site LEFT RADIAL      Drawn by [id number]      Sample type ARTERIAL      Allens test (pass/fail) PASS  PASS    BASIC METABOLIC PANEL     Status: Abnormal   Collection Time   03/10/11  6:36 AM      Component Value Range Comment   Sodium 135  135 - 145 (mEq/L) DELTA CHECK NOTED   Potassium 4.3  3.5 - 5.1 (mEq/L)    Chloride 100  96 - 112 (mEq/L)    CO2 31  19 - 32 (mEq/L)    Glucose, Bld 252 (*) 70 - 99 (mg/dL)    BUN 31 (*) 6 - 23 (mg/dL)    Creatinine, Ser 5.78  0.50 - 1.35 (mg/dL)    Calcium 8.3 (*) 8.4 - 10.5 (mg/dL)    GFR calc non Af Amer 86 (*) >90 (mL/min)    GFR calc Af Amer >90  >90 (mL/min)   CBC     Status: Abnormal   Collection Time   03/10/11  6:36 AM      Component Value Range Comment   WBC 17.4 (*) 4.0 - 10.5 (K/uL)    RBC 2.81 (*) 4.22 - 5.81 (MIL/uL)    Hemoglobin 9.1 (*) 13.0 - 17.0 (g/dL)    HCT 46.9 (*) 62.9 - 52.0 (%)    MCV 97.9  78.0 - 100.0 (fL)    MCH 32.4  26.0 - 34.0 (pg)    MCHC 33.1  30.0 - 36.0 (g/dL)    RDW 52.8  41.3 - 24.4 (%)    Platelets 343  150 - 400 (K/uL)   GLUCOSE, CAPILLARY     Status: Abnormal   Collection Time   03/10/11  7:52 AM      Component Value Range Comment   Glucose-Capillary 168 (*) 70 - 99 (mg/dL)   GLUCOSE, CAPILLARY     Status: Abnormal   Collection Time   03/10/11 11:47 AM      Component Value Range Comment   Glucose-Capillary 136 (*) 70 - 99 (mg/dL)    Comment 1 Notify RN      Comment 2 Documented in Chart     GLUCOSE, CAPILLARY     Status: Abnormal   Collection Time   03/10/11  5:11 PM      Component Value Range Comment   Glucose-Capillary 186 (*) 70 - 99 (mg/dL)   GLUCOSE, CAPILLARY     Status: Abnormal   Collection Time   03/10/11  6:23 PM      Component Value Range Comment    Glucose-Capillary 194 (*) 70 -  99 (mg/dL)   GLUCOSE, CAPILLARY     Status: Abnormal   Collection Time   03/10/11  7:57 PM      Component Value Range Comment   Glucose-Capillary 172 (*) 70 - 99 (mg/dL)     Ct Head Wo Contrast  03/10/2011  *RADIOLOGY REPORT*  Clinical Data: Neurologic changes.  CT HEAD WITHOUT CONTRAST  Technique:  Contiguous axial images were obtained from the base of the skull through the vertex without contrast.  Comparison: CT head without contrast 03/03/2011.  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 Remote lacunar infarcts in the left basal ganglia are stable. Moderate periventricular and subcortical white matter hypoattenuation is similar to the prior exam.  No acute cortical infarct, hemorrhage, or mass lesion is present.  Ventricular dilation is proportionate to the degree of atrophy and associated with the previous infarctions.  The paranasal sinuses and mastoid air cells are clear.  The osseous skull is intact.  IMPRESSION:  1.  No acute intracranial abnormality or significant interval change. 2.  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 3.  Stable atrophy and advanced white matter changes.  Original Report Authenticated By: Jamesetta Orleans. MATTERN, M.D.   Dg Chest Port 1 View  03/10/2011  *RADIOLOGY REPORT*  Clinical Data: Respiratory failure  PORTABLE CHEST - 1 VIEW  Comparison: 03/08/2011, 03/07/2011, 03/03/2011.  Findings: The endotracheal tube, is approximately 1.5 cm above the carina.  Right upper extremity PICC terminates near the junction of the superior vena cava right atrium.  Cardiomediastinal silhouette is stable, with cardiomegaly suspected.  The lung volumes are low bilaterally.  There is diffuse interstitial prominence, and patchy airspace disease at both lung bases and in the perihilar regions.  Aeration of the lungs has improved compared to 03/08/2011. No visible pleural effusion.  IMPRESSION:  1.  Improving aeration of the lungs in this patient with bibasilar airspace disease and interstitial prominence. 2.  Satisfactory position of support devices.  Original Report Authenticated By: Britta Mccreedy, M.D.    Review of Systems  Unable to perform ROS  Blood pressure 168/77, pulse 100, temperature 97.5 F (36.4 C), temperature source Axillary, resp. rate 20, height 5\' 4"  (1.626 m), weight 70 kg (154 lb 5.2 oz), SpO2 98.00%. Physical Exam  Assessment/Plan: see dictation  Marcus [name] 03/10/2011, 8:40 PM

## 2011-03-10 NOTE — Consult Note (Signed)
Marcus [name], Marcus [name]              ACCOUNT NO.:  [id number]  MEDICAL RECORD NO.:  [id number]  LOCATION:  [facility]                          FACILITY:  [facility]  PHYSICIAN:  Masashi Snowdon A. Gerilyn Pilgrim, M.D. DATE OF BIRTH:  19-Mar-1940  DATE OF CONSULTATION: DATE OF DISCHARGE:                                CONSULTATION   REASON FOR CONSULTATION:  Altered mental status.  This is a 71 year old white male who has not been feeling well before presented to the hospital.  His daughter per the chart indicated that he had been somewhat in and out of bed and having what appears to be confusion spells episodically in and out.  He did see his primary care provider a few days before coming to the hospital.  Apparently, he saw his primary care provider on February 27, 2011.  The patient became more lethargic and unresponsive and subsequently EMS was activated.  When EMS got there, he was found to be quite unresponsive, lethargic, tachypneic, hypoxic, tachycardic, and febrile.  The patient's workup is positive for bilateral pneumonia.  He has been lethargic, unresponsive.  He has been intubated and treated over the last 6 days.  On attempted weaning, the patient has been unresponsive and hence this neurological consultation. CT scan shows old stroke but nothing acute, one which was done on admission and another on the repeat one done today, both essentially showed the same findings.  Significant lab findings are significant for elevated WBC and elevated BUN 38.  WBC has been 17 today.  PHYSICAL EXAMINATION:  GENERAL:  He is intubated.  He is on a fentanyl drip 100 mcg per hour and also midazolam drip.  These were held just for the evaluation was done. HEENT:  Intubated.  Head is normocephalic, atraumatic. NECK:  Nice and supple. ABDOMEN:  Soft. EXTREMITIES:  No significant edema. NEUROLOGIC:  Mentation, eyes are open, but he is essentially staring straight ahead without any interaction with the environment.  He  does not focus or track.  He does not follow commands.  Essentially, there appears to be no reaction or interaction with the environment.  Cranial nerve evaluation, pupils are round and reactive.  Right is status post lens implantation.  Corneal reflexes are intact.  Oculocephalic reflexes and gag reflexes are also intact.  Facial muscle strength is symmetric. Moves well to painful stimuli.  Motor examination shows that he does flexion withdrawal bilaterally to deep painful stimuli.  No posturing is observed.  Reflexes are brisk.  Plantars appeared to be upgoing bilaterally.  Sensation, he respond to painful stimuli bilaterally.  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  Atrophy is noted.  Most striking, however, is large hemispheric cortical infarct involving the occipital lobes bilaterally, especially on the left side. 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 Nothing acute is, otherwise, seen on the CT scan.  IMPRESSION:  Multifactorial encephalopathy including acute illness from pneumonia, dehydration.  The patient did have some episodic presentation which may suggest seizures.  Certainly given his large bilateral cortical infarcts, he is at  risk of having seizures.  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  RECOMMENDATIONS:  I think EEG should be done.  I assume this may be done tomorrow.  I will try to get this may be done while the patient is off midazolam and other benzodiazepines.  Typical dementia labs will also be obtained.  We will continue to follow the patient.  Thanks for this consultation.     Kailen Name A. Gerilyn Pilgrim, M.D.     KAD/MEDQ  D:  03/10/2011  T:  03/10/2011  Job:   [id number]

## 2011-03-11 ENCOUNTER — Inpatient Hospital Stay (HOSPITAL_COMMUNITY)
Admit: 2011-03-11 | Discharge: 2011-03-11 | Disposition: A | Discharge status: Home / Self Care | Payer: Medicare Other | Attending: Neurology | Admitting: Neurology

## 2011-03-11 LAB — CBC
Hemoglobin: 9.6 g/dL — ABNORMAL LOW (ref 13.0–17.0)
MCH: 32.3 pg (ref 26.0–34.0)
MCV: 95.6 fL (ref 78.0–100.0)
RBC: 2.97 MIL/uL — ABNORMAL LOW (ref 4.22–5.81)
WBC: 22.1 10*3/uL — ABNORMAL HIGH (ref 4.0–10.5)

## 2011-03-11 LAB — GLUCOSE, CAPILLARY
Glucose-Capillary: 111 mg/dL — ABNORMAL HIGH (ref 70–99)
Glucose-Capillary: 188 mg/dL — ABNORMAL HIGH (ref 70–99)
Glucose-Capillary: 247 mg/dL — ABNORMAL HIGH (ref 70–99)

## 2011-03-11 LAB — CULTURE, BLOOD (ROUTINE X 2): Culture: NO GROWTH

## 2011-03-11 LAB — BASIC METABOLIC PANEL
CO2: 31 mEq/L (ref 19–32)
Glucose, Bld: 143 mg/dL — ABNORMAL HIGH (ref 70–99)
Potassium: 3.7 mEq/L (ref 3.5–5.1)
Sodium: 133 mEq/L — ABNORMAL LOW (ref 135–145)

## 2011-03-11 MED ORDER — PRO-STAT SUGAR FREE PO LIQD
30.0000 mL | Freq: Every day | ORAL | Status: DC
  Administered 2011-03-11 – 2011-03-12 (×2): 30 mL
  Filled 2011-03-11 (×2): qty 30

## 2011-03-11 NOTE — Progress Notes (Signed)
Subjective: Even with sedation off, patient still appears sedated and slowed mentation. Does not respond to any questions.  Objective: Vital signs in last 24 hours: Filed Vitals:   03/11/11 1745 03/11/11 1800 03/11/11 1815 03/11/11 1830  BP: 83/47 82/48 121/73 92/46  Pulse: 93 93 27 94  Temp:      TempSrc:      Resp: 18 19 27 19   Height:      Weight:      SpO2: 95% 96% 90% 100%    Intake/Output Summary (Last 24 hours) at 03/11/11 1906 Last data filed at 03/11/11 1821  Gross per 24 hour  Intake 3570.46 ml  Output   3401 ml  Net 169.46 ml    Weight change: -3.2 kg (-7 lb 0.9 oz)  Physical exam: General: [name] sitting up on the ventilator. He has an ET tube inserted, and NG tube, and various telemetry leads. Lungs: Few rhonchi bilaterally Heart: S1, S2. No murmurs rubs or gallops. Abdomen: Positive bowel sounds, soft, nontender, nondistended. Extremities: Trace to 1+ bilateral upper extremity edema and resolution of pedal edema. Neurologic: He is sitting up. His eyes are open. It does not appear that he has a facial droop although it is difficult to tell with the ET tube and NG tube inserted. He has corneal reflex bilaterally. He does not respond to his name. He does not provide eye contact.   Lab Results: Basic Metabolic Panel:  Basename 03/11/11 0528 03/10/11 0636  NA 133* 135  K 3.7 4.3  CL 96 100  CO2 31 31  GLUCOSE 143* 252*  BUN 26* 31*  CREATININE 0.81 0.82  CALCIUM 8.7 8.3*  MG -- --  PHOS -- --   CBC:  Basename 03/11/11 0528 03/10/11 0636  WBC 22.1* 17.4*  NEUTROABS -- --  HGB 9.6* 9.1*  HCT 28.4* 27.5*  MCV 95.6 97.9  PLT 425* 343   BNP:  Basename 03/09/11 0413  PROBNP 3890.0*   CBG:  Basename 03/11/11 1614 03/11/11 1108 03/11/11 0753 03/11/11 0417 03/10/11 2329 03/10/11 1957  GLUCAP 146* 247* 188* 111* 142* 172*   Thyroid Function Tests:  Basename 03/10/11 2056  TSH 1.111  T4TOTAL --  FREET4 --  T3FREE --    THYROIDAB --   Anemia Panel:  Basename 03/10/11 2056  VITAMINB12 403  FOLATE --  FERRITIN --  TIBC --  IRON --  RETICCTPCT --    Studies/Results: Ct Head Wo Contrast 03/10/2011   IMPRESSION:  1.  No acute intracranial abnormality or significant interval change. 2.  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 3.  Stable atrophy and advanced white matter changes.  Original Report Authenticated By: Jamesetta Orleans. MATTERN, M.D.   Dg Chest Port 1 View 03/10/2011   IMPRESSION:  1. Improving aeration of the lungs in this patient with bibasilar airspace disease and interstitial prominence. 2.  Satisfactory position of support devices.  Original Report Authenticated By: Britta Mccreedy, M.D.    Medications: I have reviewed the patient's current medications.  Assessment: Principal Problem:  *Respiratory failure Active Problems:  CAP (community acquired pneumonia)  Toxic encephalopathy  Dehydration  Rhabdomyolysis  Benign hypertension  Hyperlipidemia  GERD (gastroesophageal reflux disease)  Chronic pain  Hypokalemia  Anemia  Elevated troponin I level  Hypernatremia  Hyperglycemia  Elevated LFTs  Non-ST elevation MI (NSTEMI)  Pulmonary edema  1. Acute ventilator dependent respiratory failure secondary to community-acquired pneumonia. His ABG is noted. It has been  improving. Dr. Juanetta Gosling is assisting with ventilator management. He is being treated with vancomycin, Zosyn, azithromycin, and Solu-Medrol, and bronchodilator therapy. His fever curve has improved. His white count has increased, but this could be secondary to ongoing steroid therapy. His blood cultures are negative x4 so far. Solu-Medrol has been decreased to once daily. Depending on the EEG findings, possible attempts to extubate tomorrow.  Toxic metabolic encephalopathy. CT scan unremarkable. EEG results as per neurology are pending.  Probable superimposed pulmonary edema, given his  elevated BNP. BNP has progressively decreased with gentle Lasix daily. We'll watch his urine output and creatinine, and serum sodium.  Elevated cardiac enzymes consistent with a non-ST elevation myocardial infarction. Aspirin and metoprolol were started. He remains on prophylactic Lovenox. His EKG on 03/06/2011 revealed normal sinus rhythm with a heart rate of 94 beats per minute, left axis deviation, and inferior/anterior lateral ST and T wave abnormalities.  Hypertension. His blood pressure is better on metoprolol.  He is on Lotensin and hydrochlorothiazide chronically at home.  Hypernatremia/dehydration. His serum sodium is better with the addition of free water via the tube. We'll continue to monitor and just IV fluids as needed.  Elevated liver transaminases. Resolved. This may have been secondary to rhabdomyolysis.   Hypokalemia. His blood magnesium level is within normal limits. His serum potassium is within normal limits with supplementation.  Steroid-induced hyperglycemia/secondary to tube feedings. His blood glucose is trending downward with the decrease in Solu-Medrol. Will therefore change sliding scale NovoLog to sensitive scale and decrease Lantus to 7 units twice a day. His hemoglobin A1c is 5.8.  Anemia. His hemoglobin was 12.7 on admission and has drifted downward. This may be secondary to acute and chronic disease, hemodilution, and venipuncture.  Chronic pain syndrome. He is on transdermal Duragesic chronically at home.    LOS: 8 days   Daya Dutt K 03/11/2011, 7:06 PM

## 2011-03-11 NOTE — Progress Notes (Signed)
CSW received call from [place] with [facility].  She reports case was passed to this county and is trying to gather information to determine if case should be opened.  Misty Stanley was provided with ICU number to talk with RN and will follow up with CSW with decision.    Karn Cassis

## 2011-03-11 NOTE — Progress Notes (Signed)
NAMEALEEM, ELZA              ACCOUNT NO.:  [id number]  MEDICAL RECORD NO.:  [id number]  LOCATION:  IC04                          FACILITY:  [facility]  PHYSICIAN:  Laramie Meissner A. Gerilyn Pilgrim, M.D. DATE OF BIRTH:  1940-04-02  DATE OF PROCEDURE: DATE OF DISCHARGE:                                PROGRESS NOTE   The patient overall seemed to be doing about the same, actually was weaned on the vent today and we think that we could be able to try extubating the patient.  On examination today, he is examined on sedation.  He essentially is the same, opens his eyes to deep painful stimuli.  He looks straight ahead.  He does not focus or tracks.  Does not attempt to follow commands.  He does grunts to deep painful stimuli. Brainstem reflexes again are intact.  Pupils are reactive. Oculocephalic reflexes are intact and also coronary reflexes.  He does move both sides to deep painful stimuli.  Again he moves the right side more responsive and purposeful withdrawal.  EEG is essentially unremarkable.  There is actually no slowing seen and normal background activity and no epileptiform activity.  ASSESSMENT:  Multifactorial encephalopathy likely due to toxic metabolic reasons.  From my standpoint, a trial of extubation is fine.  We will continue to follow the patient.     Saylee Sherrill A. Gerilyn Pilgrim, M.D.     KAD/MEDQ  D:  03/11/2011  T:  03/11/2011  Job:  [id number]

## 2011-03-11 NOTE — Progress Notes (Signed)
Subjective: Interval History: see dictation  Objective: Vital signs in last 24 hours: Temp:  [99.1 F (37.3 C)-99.8 F (37.7 C)] 99.1 F (37.3 C) (01/02 1600) Pulse Rate:  [27-110] 98  (01/02 1900) Resp:  [14-33] 26  (01/02 1900) BP: (82-178)/(46-146) 136/59 mmHg (01/02 1900) SpO2:  [90 %-100 %] 96 % (01/02 1952) FiO2 (%):  [39.5 %-40.4 %] 40 % (01/02 1952) Weight:  [66.8 kg (147 lb 4.3 oz)] 147 lb 4.3 oz (66.8 kg) (01/02 0400)  Intake/Output from previous day: 01/01 0701 - 01/02 0700 In: 3041.9 [I.V.:1279.9; NG/GT:1260; IV Piggyback:502] Out: 4802 [Urine:4800; Stool:2] Intake/Output this shift:   Nutritional status:     Lab Results:  Basename 03/11/11 0528 03/10/11 0636  WBC 22.1* 17.4*  HGB 9.6* 9.1*  HCT 28.4* 27.5*  PLT 425* 343  NA 133* 135  K 3.7 4.3  CL 96 100  CO2 31 31  GLUCOSE 143* 252*  BUN 26* 31*  CREATININE 0.81 0.82  CALCIUM 8.7 8.3*  LABA1C -- --   Lipid Panel No results found for this basename: CHOL,TRIG,HDL,CHOLHDL,VLDL,LDLCALC in the last 72 hours  Studies/Results: Ct Head Wo Contrast  03/10/2011  *RADIOLOGY REPORT*  Clinical Data: Neurologic changes.  CT HEAD WITHOUT CONTRAST  Technique:  Contiguous axial images were obtained from the base of the skull through the vertex without contrast.  Comparison: CT head without contrast 03/03/2011.  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 Remote lacunar infarcts in the left basal ganglia are stable. Moderate periventricular and subcortical white matter hypoattenuation is similar to the prior exam.  No acute cortical infarct, hemorrhage, or mass lesion is present.  Ventricular dilation is proportionate to the degree of atrophy and associated with the previous infarctions.  The paranasal sinuses and mastoid air cells are clear.  The osseous skull is intact.  IMPRESSION:  1.  No acute intracranial abnormality or significant interval change. 2.  Remote infarcts of the occipital lobe  bilaterally and left basal ganglia are stable. 3.  Stable atrophy and advanced white matter changes.  Original Report Authenticated By: Jamesetta Orleans. MATTERN, M.D.   Dg Chest Port 1 View  03/10/2011  *RADIOLOGY REPORT*  Clinical Data: Respiratory failure  PORTABLE CHEST - 1 VIEW  Comparison: 03/08/2011, 03/07/2011, 03/03/2011.  Findings: The endotracheal tube, is approximately 1.5 cm above the carina.  Right upper extremity PICC terminates near the junction of the superior vena cava right atrium.  Cardiomediastinal silhouette is stable, with cardiomegaly suspected.  The lung volumes are low bilaterally.  There is diffuse interstitial prominence, and patchy airspace disease at both lung bases and in the perihilar regions.  Aeration of the lungs has improved compared to 03/08/2011. No visible pleural effusion.  IMPRESSION:  1. Improving aeration of the lungs in this patient with bibasilar airspace disease and interstitial prominence. 2.  Satisfactory position of support devices.  Original Report Authenticated By: Britta Mccreedy, M.D.    Medications:  Scheduled Meds:   . albuterol  2.5 mg Nebulization Q6H  . antiseptic oral rinse  1 application Mouth Rinse QID  . aspirin  81 mg Oral Daily  . azithromycin  500 mg Intravenous Q24H  . chlorhexidine  15 mL Mouth/Throat BID  . enoxaparin  40 mg Subcutaneous Q24H  . feeding supplement  30 mL Per Tube Q2000  . free water  200 mL Per Tube Q6H  . furosemide  20 mg Intravenous Daily  . insulin aspart  0-9 Units Subcutaneous Q4H  . insulin glargine  7 Units Subcutaneous BID  . ipratropium  0.5 mg Nebulization Q6H  . methylPREDNISolone (SOLU-MEDROL) injection  60 mg Intravenous Q24H  . metoprolol tartrate  12.5 mg Oral BID  . pantoprazole sodium  40 mg Per Tube Q1200  . piperacillin-tazobactam (ZOSYN)  IV  3.375 g Intravenous Q8H  . potassium chloride  20 mEq Oral BID  . sodium chloride  10 mL Intracatheter Q12H  . vancomycin  750 mg Intravenous Q12H    Continuous Infusions:   . dextrose 5 % with KCl 20 mEq / L 20 mEq (03/11/11 1900)  . feeding supplement (JEVITY 1.2) 1,000 mL (03/11/11 0254)  . fentaNYL infusion INTRAVENOUS 150 mcg/hr (03/11/11 1900)  . midazolam (VERSED) infusion 3 mg/hr (03/11/11 1900)   PRN Meds:.acetaminophen, ondansetron (ZOFRAN) IV, ondansetron, sodium chloride   Assessment/Plan: see dictation   LOS: 8 days   Marcus [name]

## 2011-03-11 NOTE — Progress Notes (Signed)
Subjective: He is still poorly responsive but I think better. No other new problems have been noted. His CT does not show anything acute  Objective: Vital signs in last 24 hours: Temp:  [97.5 F (36.4 C)-99.2 F (37.3 C)] 99.2 F (37.3 C) (01/02 0300) Pulse Rate:  [73-110] 101  (01/02 0600) Resp:  [15-27] 26  (01/02 0600) BP: (117-168)/(52-146) 135/61 mmHg (01/02 0600) SpO2:  [91 %-100 %] 99 % (01/02 0600) FiO2 (%):  [39.6 %-40.4 %] 40.2 % (01/02 0600) Weight:  [66.8 kg (147 lb 4.3 oz)] 147 lb 4.3 oz (66.8 kg) (01/02 0400) Weight change: -3.2 kg (-7 lb 0.9 oz) Last BM Date: 03/10/11  Intake/Output from previous day: 01/01 0701 - 01/02 0700 In: 2901.9 [I.V.:1199.9; NG/GT:1200; IV Piggyback:502] Out: 4802 [Urine:4800; Stool:2]  PHYSICAL EXAM General appearance: Intubated on mechanical ventilation but weaning Resp: rhonchi bilaterally Cardio: regular rate and rhythm, S1, S2 normal, no murmur, click, rub or gallop GI: soft, non-tender; bowel sounds normal; no masses,  no organomegaly Extremities: extremities normal, atraumatic, no cyanosis or edema  Lab Results:    Basic Metabolic Panel:  Basename 03/11/11 0528 03/10/11 0636  NA 133* 135  K 3.7 4.3  CL 96 100  CO2 31 31  GLUCOSE 143* 252*  BUN 26* 31*  CREATININE 0.81 0.82  CALCIUM 8.7 8.3*  MG -- --  PHOS -- --   Liver Function Tests: No results found for this basename: AST:2,ALT:2,ALKPHOS:2,BILITOT:2,PROT:2,ALBUMIN:2 in the last 72 hours No results found for this basename: LIPASE:2,AMYLASE:2 in the last 72 hours No results found for this basename: AMMONIA:2 in the last 72 hours CBC:  Basename 03/11/11 0528 03/10/11 0636  WBC 22.1* 17.4*  NEUTROABS -- --  HGB 9.6* 9.1*  HCT 28.4* 27.5*  MCV 95.6 97.9  PLT 425* 343   Cardiac Enzymes: No results found for this basename: CKTOTAL:3,CKMB:3,CKMBINDEX:3,TROPONINI:3 in the last 72 hours BNP:  [facility] 03/09/11 0413  PROBNP 3890.0*   D-Dimer: No results found  for this basename: DDIMER:2 in the last 72 hours CBG:  Basename 03/11/11 0417 03/10/11 2329 03/10/11 1957 03/10/11 1823 03/10/11 1711 03/10/11 1147  GLUCAP 111* 142* 172* 194* 186* 136*   Hemoglobin A1C: No results found for this basename: HGBA1C in the last 72 hours Fasting Lipid Panel: No results found for this basename: CHOL,HDL,LDLCALC,TRIG,CHOLHDL,LDLDIRECT in the last 72 hours Thyroid Function Tests: No results found for this basename: TSH,T4TOTAL,FREET4,T3FREE,THYROIDAB in the last 72 hours Anemia Panel: No results found for this basename: VITAMINB12,FOLATE,FERRITIN,TIBC,IRON,RETICCTPCT in the last 72 hours Coagulation: No results found for this basename: LABPROT:2,INR:2 in the last 72 hours Urine Drug Screen: Drugs of Abuse  No results found for this basename: labopia, cocainscrnur, labbenz, amphetmu, thcu, labbarb    Alcohol Level: No results found for this basename: ETH:2 in the last 72 hours Urinalysis:  Misc. Labs:  ABGS  Basename 03/10/11 0508  PHART 7.445  PO2ART 137.0*  TCO2 26.9  HCO3 29.7*   CULTURES Recent Results (from the past 240 hour(s))  CULTURE, BLOOD (ROUTINE X 2)     Status: Normal   Collection Time   03/03/11  3:05 PM      Component Value Range Status Comment   Specimen Description BLOOD RIGHT ANTECUBITAL   Final    Special Requests     Final    Value: BOTTLES DRAWN AEROBIC AND ANAEROBIC 6CC EACH BOTTLE   Culture NO GROWTH 5 DAYS   Final    Report Status 03/08/2011 FINAL   Final   CULTURE,  BLOOD (ROUTINE X 2)     Status: Normal   Collection Time   03/03/11  4:31 PM      Component Value Range Status Comment   Specimen Description BLOOD RIGHT HAND   Final    Special Requests BOTTLES DRAWN AEROBIC ONLY 6CC BOTTLE   Final    Culture NO GROWTH 5 DAYS   Final    Report Status 03/08/2011 FINAL   Final   MRSA PCR SCREENING     Status: Normal   Collection Time   03/03/11  6:35 PM      Component Value Range Status Comment   MRSA by PCR  NEGATIVE  NEGATIVE  Final   CULTURE, BLOOD (ROUTINE X 2)     Status: Normal (Preliminary result)   Collection Time   03/06/11  8:38 AM      Component Value Range Status Comment   Specimen Description BLOOD SITE NOT SPECIFIED DRAWN BY RN   Final    Special Requests BOTTLES DRAWN AEROBIC AND ANAEROBIC 6CC   Final    Culture NO GROWTH 4 DAYS   Final    Report Status PENDING   Incomplete   CULTURE, BLOOD (ROUTINE X 2)     Status: Normal (Preliminary result)   Collection Time   03/06/11  8:56 AM      Component Value Range Status Comment   Specimen Description BLOOD LEFT ARM   Final    Special Requests BOTTLES DRAWN AEROBIC AND ANAEROBIC 6CC   Final    Culture NO GROWTH 4 DAYS   Final    Report Status PENDING   Incomplete    Studies/Results: Ct Head Wo Contrast  03/10/2011  *RADIOLOGY REPORT*  Clinical Data: Neurologic changes.  CT HEAD WITHOUT CONTRAST  Technique:  Contiguous axial images were obtained from the base of the skull through the vertex without contrast.  Comparison: CT head without contrast 03/03/2011.  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 Remote lacunar infarcts in the left basal ganglia are stable. Moderate periventricular and subcortical white matter hypoattenuation is similar to the prior exam.  No acute cortical infarct, hemorrhage, or mass lesion is present.  Ventricular dilation is proportionate to the degree of atrophy and associated with the previous infarctions.  The paranasal sinuses and mastoid air cells are clear.  The osseous skull is intact.  IMPRESSION:  1.  No acute intracranial abnormality or significant interval change. 2.  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 3.  Stable atrophy and advanced white matter changes.  Original Report Authenticated By: Jamesetta Orleans. MATTERN, M.D.   Dg Chest Port 1 View  03/10/2011  *RADIOLOGY REPORT*  Clinical Data: Respiratory failure  PORTABLE CHEST - 1 VIEW   Comparison: 03/08/2011, 03/07/2011, 03/03/2011.  Findings: The endotracheal tube, is approximately 1.5 cm above the carina.  Right upper extremity PICC terminates near the junction of the superior vena cava right atrium.  Cardiomediastinal silhouette is stable, with cardiomegaly suspected.  The lung volumes are low bilaterally.  There is diffuse interstitial prominence, and patchy airspace disease at both lung bases and in the perihilar regions.  Aeration of the lungs has improved compared to 03/08/2011. No visible pleural effusion.  IMPRESSION:  1. Improving aeration of the lungs in this patient with bibasilar airspace disease and interstitial prominence. 2.  Satisfactory position of support devices.  Original Report Authenticated By: Britta Mccreedy, M.D.    Medications:  Scheduled:   . albuterol  2.5 mg Nebulization Q6H  . antiseptic oral rinse  1 application Mouth Rinse QID  . aspirin  81 mg Oral Daily  . azithromycin  500 mg Intravenous Q24H  . chlorhexidine  15 mL Mouth/Throat BID  . enoxaparin  40 mg Subcutaneous Q24H  . free water  200 mL Per Tube Q6H  . furosemide  20 mg Intravenous Daily  . insulin aspart  0-9 Units Subcutaneous Q4H  . insulin glargine  7 Units Subcutaneous BID  . ipratropium  0.5 mg Nebulization Q6H  . methylPREDNISolone (SOLU-MEDROL) injection  60 mg Intravenous Q24H  . metoprolol tartrate  12.5 mg Oral BID  . pantoprazole sodium  40 mg Per Tube Q1200  . piperacillin-tazobactam (ZOSYN)  IV  3.375 g Intravenous Q8H  . potassium chloride  20 mEq Oral BID  . sodium chloride  10 mL Intracatheter Q12H  . vancomycin  750 mg Intravenous Q12H   Continuous:   . dextrose 5 % with KCl 20 mEq / L 20 mEq (03/11/11 0500)  . feeding supplement (JEVITY 1.2) 1,000 mL (03/11/11 0254)  . fentaNYL infusion INTRAVENOUS Stopped (03/11/11 1610)  . midazolam (VERSED) infusion Stopped (03/11/11 0500)   RUE:[id number], ondansetron (ZOFRAN) IV, ondansetron, sodium  chloride  Assesment: He has respiratory failure which is multifactorial. From a pulmonary standpoint he seems to be improving. However he is still not fully responsive Principal Problem:  *Respiratory failure Active Problems:  CAP (community acquired pneumonia)  Toxic encephalopathy  Dehydration  Rhabdomyolysis  Benign hypertension  Hyperlipidemia  GERD (gastroesophageal reflux disease)  Chronic pain  Hypokalemia  Anemia  Elevated troponin I level  Hypernatremia  Hyperglycemia  Elevated LFTs  Non-ST elevation MI (NSTEMI)  Pulmonary edema    Plan: I will see if he can be weaned . I will discuss with Dr. Gerilyn Pilgrim whether he thinks that he be able to protect his airway if we pull the endotracheal tube    LOS: 8 days   Bowe Sidor L 03/11/2011, 7:46 AM

## 2011-03-11 NOTE — Progress Notes (Signed)
*  PRELIMINARY RESULTS* EEG has been performed.  Markus Jarvis 03/11/2011, 2:04 PM

## 2011-03-11 NOTE — Progress Notes (Signed)
Nutrition Follow-up  Pt remains intubated and sedated. Continuous Jev 1.2 @ 60 ml/hr providing 1728 kcal, 80 gr protein and 1162 ml water. Free water flushes 200 ml q 6hr add 800 ml per day. Pt est nutr needs re-assessed. Current est needs : 1785 kcal, 87-100 gr protein. Nutr support meeting 97% energy, 92% protein needs.  Diet Order: NPO  Meds: Scheduled Meds:   . albuterol  2.5 mg Nebulization Q6H  . antiseptic oral rinse  1 application Mouth Rinse QID  . aspirin  81 mg Oral Daily  . azithromycin  500 mg Intravenous Q24H  . chlorhexidine  15 mL Mouth/Throat BID  . enoxaparin  40 mg Subcutaneous Q24H  . free water  200 mL Per Tube Q6H  . furosemide  20 mg Intravenous Daily  . insulin aspart  0-9 Units Subcutaneous Q4H  . insulin glargine  7 Units Subcutaneous BID  . ipratropium  0.5 mg Nebulization Q6H  . methylPREDNISolone (SOLU-MEDROL) injection  60 mg Intravenous Q24H  . metoprolol tartrate  12.5 mg Oral BID  . pantoprazole sodium  40 mg Per Tube Q1200  . piperacillin-tazobactam (ZOSYN)  IV  3.375 g Intravenous Q8H  . potassium chloride  20 mEq Oral BID  . sodium chloride  10 mL Intracatheter Q12H  . vancomycin  750 mg Intravenous Q12H   Continuous Infusions:   . dextrose 5 % with KCl 20 mEq / L 20 mEq (03/11/11 1200)  . feeding supplement (JEVITY 1.2) 1,000 mL (03/11/11 0254)  . fentaNYL infusion INTRAVENOUS 100 mcg/hr (03/11/11 1200)  . midazolam (VERSED) infusion Stopped (03/11/11 0500)   PRN Meds:.acetaminophen, ondansetron (ZOFRAN) IV, ondansetron, sodium chloride  Labs:  CMP     Component Value Date/Time   NA 133* 03/11/2011 0528   K 3.7 03/11/2011 0528   CL 96 03/11/2011 0528   CO2 31 03/11/2011 0528   GLUCOSE 143* 03/11/2011 0528   BUN 26* 03/11/2011 0528   CREATININE 0.81 03/11/2011 0528   CALCIUM 8.7 03/11/2011 0528   PROT 5.7* 03/07/2011 0453   ALBUMIN 2.1* 03/07/2011 0453   AST 21 03/07/2011 0453   ALT 30 03/07/2011 0453   ALKPHOS 38* 03/07/2011 0453   BILITOT 0.2*  03/07/2011 0453   GFRNONAA 87* 03/11/2011 0528   GFRAA >90 03/11/2011 0528     Intake/Output Summary (Last 24 hours) at 03/11/11 1644 Last data filed at 03/11/11 1200  Gross per 24 hour  Intake 2618.46 ml  Output   4201 ml  Net -1582.54 ml    Weight Status: Weight change: -7 lb 0.9 oz (-3.2 kg) Receiving Lasix daily.  NUTRITION DIAGNOSIS:  -Inadequate oral intake (NI-2.1). Status: Ongoing   RELATED TO: inability to eat   AS EVIDENCE BY: respiratory failure, NPO status   GOAL: Pt will meet > 90% minimum est energy and protein requirements.   INTERVENTION: Continuous TF Jev 1.2 @ goal rate of 60 ml/hr.  Add ProStat 30 ml per day (72 kcal, 15 gr protein)  MONITORING: TF tol, wt change and labs   Francene Boyers Pager [phone]

## 2011-03-12 ENCOUNTER — Other Ambulatory Visit (HOSPITAL_COMMUNITY): Payer: Medicare Other

## 2011-03-12 LAB — CBC
HCT: 26.2 % — ABNORMAL LOW (ref 39.0–52.0)
Hemoglobin: 8.6 g/dL — ABNORMAL LOW (ref 13.0–17.0)
MCH: 31.6 pg (ref 26.0–34.0)
MCHC: 32.8 g/dL (ref 30.0–36.0)
MCV: 96.3 fL (ref 78.0–100.0)

## 2011-03-12 LAB — GLUCOSE, CAPILLARY
Glucose-Capillary: 165 mg/dL — ABNORMAL HIGH (ref 70–99)
Glucose-Capillary: 239 mg/dL — ABNORMAL HIGH (ref 70–99)
Glucose-Capillary: 142 mg/dL — ABNORMAL HIGH (ref 70–99)

## 2011-03-12 LAB — BASIC METABOLIC PANEL
BUN: 34 mg/dL — ABNORMAL HIGH (ref 6–23)
CO2: 31 mEq/L (ref 19–32)
Chloride: 99 mEq/L (ref 96–112)
Creatinine, Ser: 0.87 mg/dL (ref 0.50–1.35)
Glucose, Bld: 131 mg/dL — ABNORMAL HIGH (ref 70–99)
Potassium: 4 mEq/L (ref 3.5–5.1)

## 2011-03-12 LAB — BLOOD GAS, ARTERIAL
Acid-Base Excess: 3.5 mmol/L — ABNORMAL HIGH (ref 0.0–2.0)
Bicarbonate: 26.5 mEq/L — ABNORMAL HIGH (ref 20.0–24.0)
FIO2: 0.4 %
Patient temperature: 37
TCO2: 24.1 mmol/L (ref 0–100)
pH, Arterial: 7.508 — ABNORMAL HIGH (ref 7.350–7.450)

## 2011-03-12 LAB — VANCOMYCIN, TROUGH: Vancomycin Tr: 9.1 ug/mL — ABNORMAL LOW (ref 10.0–20.0)

## 2011-03-12 MED ORDER — IPRATROPIUM BROMIDE 0.02 % IN SOLN
RESPIRATORY_TRACT | Status: AC
  Administered 2011-03-12: 0.5 mg via RESPIRATORY_TRACT
  Filled 2011-03-12: qty 2.5

## 2011-03-12 MED ORDER — INSULIN GLARGINE 100 UNIT/ML ~~LOC~~ SOLN: 12.0000 [IU] | Freq: Two times a day (BID) | SUBCUTANEOUS | Status: DC

## 2011-03-12 MED ORDER — METHYLPREDNISOLONE SODIUM SUCC 40 MG IJ SOLR
40.0000 mg | INTRAMUSCULAR | Status: DC
  Administered 2011-03-13 – 2011-03-14 (×2): 40 mg via INTRAVENOUS
  Filled 2011-03-12 (×2): qty 1

## 2011-03-12 MED ORDER — ALBUTEROL SULFATE (5 MG/ML) 0.5% IN NEBU
INHALATION_SOLUTION | RESPIRATORY_TRACT | Status: AC
  Administered 2011-03-12: 2.5 mg via RESPIRATORY_TRACT
  Filled 2011-03-12: qty 0.5

## 2011-03-12 MED ORDER — VANCOMYCIN HCL IN DEXTROSE 1-5 GM/200ML-% IV SOLN
1000.0000 mg | Freq: Two times a day (BID) | INTRAVENOUS | Status: DC
  Administered 2011-03-12 – 2011-03-14 (×6): 1000 mg via INTRAVENOUS
  Filled 2011-03-12 (×9): qty 200

## 2011-03-12 MED ORDER — INSULIN GLARGINE 100 UNIT/ML ~~LOC~~ SOLN
10.0000 [IU] | Freq: Two times a day (BID) | SUBCUTANEOUS | Status: DC
  Administered 2011-03-12 – 2011-03-14 (×5): 10 [IU] via SUBCUTANEOUS

## 2011-03-12 NOTE — Procedures (Signed)
NAMEJAYDN, MOSCATO              ACCOUNT NO.:  [id number]  MEDICAL RECORD NO.:  [id number]  LOCATION:  IC04                          FACILITY:  [facility]  PHYSICIAN:  Zalmen Wrightsman A. Gerilyn Pilgrim, M.D. DATE OF BIRTH:  [birth year]  DATE OF PROCEDURE: DATE OF DISCHARGE:                             EEG INTERPRETATION   The patient is a 71 year old man who presents with altered mentation, confusion and encephalopathy.  He has had large strokes before.  The study has been done to evaluate for nonconvulsive seizures.  MEDICATIONS:  Zithromax, Lovenox, insulin, Lasix, prednisone, Protonix, Zosyn, vancomycin and potassium.  He was also on versed and fentanyl. They were held 30 minutes prior to the procedure.  ANALYSIS:  A 16-channel recording is conducted for 20 minutes.  Typical standard 10/20 measurements are used. There is a posterior dominant rhythm of 8.5 Hz which attenuates with eye opening.  There is beta activity observed in the frontal areas.  Photic stimulation is carried out without abnormal changes in the background activity.  There is no focal or lateralized slowing.  There is no epileptiform discharges seen.  IMPRESSION:  Unremarkable recording of awake and drowsy states.     Elley Harp A. Gerilyn Pilgrim, M.D.     KAD/MEDQ  D:  03/11/2011  T:  03/12/2011  Job:  [id number]

## 2011-03-12 NOTE — Consult Note (Signed)
ANTIBIOTIC CONSULT NOTE   Pharmacy Consult for Vancomycin Indication: pneumonia  No Known Allergies  Patient Measurements: Height: 5\' 4"  (162.6 cm) Weight: 152 lb 12.5 oz (69.3 kg) IBW/kg (Calculated) : 59.2   Vital Signs: Temp: 98.1 F (36.7 C) (01/03 0400) Temp src: Axillary (01/03 0400) BP: 90/47 mmHg (01/03 0645) Pulse Rate: 81  (01/03 0600) Intake/Output from previous day: 01/02 0701 - 01/03 0700 In: 4375.4 [I.V.:1293.4; NG/GT:2180; IV Piggyback:902] Out: 4101 [Urine:4100; Stool:1] Intake/Output from this shift:    Labs:  Basename 03/12/11 0505 03/11/11 0528 03/10/11 0636  WBC 15.2* 22.1* 17.4*  HGB 8.6* 9.6* 9.1*  PLT 439* 425* 343  LABCREA -- -- --  CREATININE 0.87 0.81 0.82   Estimated Creatinine Clearance: 64.3 ml/min (by C-G formula based on Cr of 0.87).  Basename 03/12/11 0935  VANCOTROUGH 9.1*  VANCOPEAK --  Drue Dun --  GENTTROUGH --  GENTPEAK --  GENTRANDOM --  TOBRATROUGH --  TOBRAPEAK --  TOBRARND --  AMIKACINPEAK --  AMIKACINTROU --  AMIKACIN --    Microbiology: Recent Results (from the past 720 hour(s))  CULTURE, BLOOD (ROUTINE X 2)     Status: Normal   Collection Time   03/03/11  3:05 PM      Component Value Range Status Comment   Specimen Description BLOOD RIGHT ANTECUBITAL   Final    Special Requests     Final    Value: BOTTLES DRAWN AEROBIC AND ANAEROBIC 6CC EACH BOTTLE   Culture NO GROWTH 5 DAYS   Final    Report Status 03/08/2011 FINAL   Final   CULTURE, BLOOD (ROUTINE X 2)     Status: Normal   Collection Time   03/03/11  4:31 PM      Component Value Range Status Comment   Specimen Description BLOOD RIGHT HAND   Final    Special Requests BOTTLES DRAWN AEROBIC ONLY 6CC BOTTLE   Final    Culture NO GROWTH 5 DAYS   Final    Report Status 03/08/2011 FINAL   Final   MRSA PCR SCREENING     Status: Normal   Collection Time   03/03/11  6:35 PM      Component Value Range Status Comment   MRSA by PCR NEGATIVE  NEGATIVE  Final     CULTURE, BLOOD (ROUTINE X 2)     Status: Normal   Collection Time   03/06/11  8:38 AM      Component Value Range Status Comment   Specimen Description BLOOD SITE NOT SPECIFIED DRAWN BY RN   Final    Special Requests BOTTLES DRAWN AEROBIC AND ANAEROBIC 6CC   Final    Culture NO GROWTH 5 DAYS   Final    Report Status 03/11/2011 FINAL   Final   CULTURE, BLOOD (ROUTINE X 2)     Status: Normal   Collection Time   03/06/11  8:56 AM      Component Value Range Status Comment   Specimen Description BLOOD LEFT ARM   Final    Special Requests BOTTLES DRAWN AEROBIC AND ANAEROBIC 6CC   Final    Culture NO GROWTH 5 DAYS   Final    Report Status 03/11/2011 FINAL   Final    Medical History: Past Medical History  Diagnosis Date  . Hypertension   . GERD (gastroesophageal reflux disease)   . Hypercholesteremia   . Stroke   . Anemia 03/06/2011  . Smoker   . Non-ST elevation MI (NSTEMI) 03/07/2011  With PNA and resp failure   Medications:  Scheduled:     . albuterol  2.5 mg Nebulization Q6H  . antiseptic oral rinse  1 application Mouth Rinse QID  . aspirin  81 mg Oral Daily  . azithromycin  500 mg Intravenous Q24H  . chlorhexidine  15 mL Mouth/Throat BID  . enoxaparin  40 mg Subcutaneous Q24H  . feeding supplement  30 mL Per Tube Q2000  . free water  200 mL Per Tube Q6H  . furosemide  20 mg Intravenous Daily  . insulin aspart  0-9 Units Subcutaneous Q4H  . insulin glargine  7 Units Subcutaneous BID  . ipratropium  0.5 mg Nebulization Q6H  . methylPREDNISolone (SOLU-MEDROL) injection  60 mg Intravenous Q24H  . metoprolol tartrate  12.5 mg Oral BID  . pantoprazole sodium  40 mg Per Tube Q1200  . piperacillin-tazobactam (ZOSYN)  IV  3.375 g Intravenous Q8H  . potassium chloride  20 mEq Oral BID  . sodium chloride  10 mL Intracatheter Q12H  . vancomycin  750 mg Intravenous Q12H   Assessment: Trough level below. Renal fxn stable  Goal of Therapy:  Vancomycin trough level 15-20  mcg/ml  Plan: Increase Vancomycin to 1000 mg iv q12hrs Labs per protocol  Gilman Buttner, [place] J 03/12/2011,10:59 AM

## 2011-03-12 NOTE — Progress Notes (Signed)
Subjective: He is overall about the same. Dr. Gerilyn Pilgrim feels that he could be extubated and protect his airway. He may be slightly more alert today but still not really following directions  Objective: Vital signs in last 24 hours: Temp:  [98.1 F (36.7 C)-99.8 F (37.7 C)] 98.1 F (36.7 C) (01/03 0400) Pulse Rate:  [27-107] 81  (01/03 0600) Resp:  [14-33] 18  (01/03 0645) BP: (81-178)/(41-104) 90/47 mmHg (01/03 0645) SpO2:  [90 %-100 %] 100 % (01/03 0736) FiO2 (%):  [39.5 %-40.5 %] 40 % (01/03 0733) Weight:  [69.3 kg (152 lb 12.5 oz)] 152 lb 12.5 oz (69.3 kg) (01/03 0500) Weight change: 2.5 kg (5 lb 8.2 oz) Last BM Date: 03/11/11  Intake/Output from previous day: 01/02 0701 - 01/03 0700 In: 4375.4 [I.V.:1293.4; NG/GT:2180; IV Piggyback:902] Out: 4101 [Urine:4100; Stool:1]  PHYSICAL EXAM General appearance: no distress and Intubated his eyes are open but he is poorly responsive Resp: rhonchi bilaterally Cardio: regular rate and rhythm, S1, S2 normal, no murmur, click, rub or gallop GI: soft, non-tender; bowel sounds normal; no masses,  no organomegaly Extremities: extremities normal, atraumatic, no cyanosis or edema  Lab Results:    Basic Metabolic Panel:  Basename 03/12/11 0505 03/11/11 0528  NA 137 133*  K 4.0 3.7  CL 99 96  CO2 31 31  GLUCOSE 131* 143*  BUN 34* 26*  CREATININE 0.87 0.81  CALCIUM 8.7 8.7  MG -- --  PHOS -- --   Liver Function Tests: No results found for this basename: AST:2,ALT:2,ALKPHOS:2,BILITOT:2,PROT:2,ALBUMIN:2 in the last 72 hours No results found for this basename: LIPASE:2,AMYLASE:2 in the last 72 hours No results found for this basename: AMMONIA:2 in the last 72 hours CBC:  Basename 03/12/11 0505 03/11/11 0528  WBC 15.2* 22.1*  NEUTROABS -- --  HGB 8.6* 9.6*  HCT 26.2* 28.4*  MCV 96.3 95.6  PLT 439* 425*   Cardiac Enzymes: No results found for this basename: CKTOTAL:3,CKMB:3,CKMBINDEX:3,TROPONINI:3 in the last 72  hours BNP: No results found for this basename: PROBNP:3 in the last 72 hours D-Dimer: No results found for this basename: DDIMER:2 in the last 72 hours CBG:  Basename 03/12/11 0417 03/12/11 0003 03/11/11 2017 03/11/11 1614 03/11/11 1108 03/11/11 0753  GLUCAP 165* 126* 131* 146* 247* 188*   Hemoglobin A1C: No results found for this basename: HGBA1C in the last 72 hours Fasting Lipid Panel: No results found for this basename: CHOL,HDL,LDLCALC,TRIG,CHOLHDL,LDLDIRECT in the last 72 hours Thyroid Function Tests:  Basename 03/10/11 2056  TSH 1.111  T4TOTAL --  FREET4 --  T3FREE --  THYROIDAB --   Anemia Panel:  Basename 03/10/11 2056  VITAMINB12 403  FOLATE --  FERRITIN --  TIBC --  IRON --  RETICCTPCT --   Coagulation: No results found for this basename: LABPROT:2,INR:2 in the last 72 hours Urine Drug Screen: Drugs of Abuse  No results found for this basename: labopia, cocainscrnur, labbenz, amphetmu, thcu, labbarb    Alcohol Level: No results found for this basename: ETH:2 in the last 72 hours Urinalysis:  Misc. Labs:  ABGS  Basename 03/10/11 0508  PHART 7.445  PO2ART 137.0*  TCO2 26.9  HCO3 29.7*   CULTURES Recent Results (from the past 240 hour(s))  CULTURE, BLOOD (ROUTINE X 2)     Status: Normal   Collection Time   03/03/11  3:05 PM      Component Value Range Status Comment   Specimen Description BLOOD RIGHT ANTECUBITAL   Final    Special Requests  Final    Value: BOTTLES DRAWN AEROBIC AND ANAEROBIC 6CC EACH BOTTLE   Culture NO GROWTH 5 DAYS   Final    Report Status 03/08/2011 FINAL   Final   CULTURE, BLOOD (ROUTINE X 2)     Status: Normal   Collection Time   03/03/11  4:31 PM      Component Value Range Status Comment   Specimen Description BLOOD RIGHT HAND   Final    Special Requests BOTTLES DRAWN AEROBIC ONLY 6CC BOTTLE   Final    Culture NO GROWTH 5 DAYS   Final    Report Status 03/08/2011 FINAL   Final   MRSA PCR SCREENING     Status:  Normal   Collection Time   03/03/11  6:35 PM      Component Value Range Status Comment   MRSA by PCR NEGATIVE  NEGATIVE  Final   CULTURE, BLOOD (ROUTINE X 2)     Status: Normal   Collection Time   03/06/11  8:38 AM      Component Value Range Status Comment   Specimen Description BLOOD SITE NOT SPECIFIED DRAWN BY RN   Final    Special Requests BOTTLES DRAWN AEROBIC AND ANAEROBIC 6CC   Final    Culture NO GROWTH 5 DAYS   Final    Report Status 03/11/2011 FINAL   Final   CULTURE, BLOOD (ROUTINE X 2)     Status: Normal   Collection Time   03/06/11  8:56 AM      Component Value Range Status Comment   Specimen Description BLOOD LEFT ARM   Final    Special Requests BOTTLES DRAWN AEROBIC AND ANAEROBIC 6CC   Final    Culture NO GROWTH 5 DAYS   Final    Report Status 03/11/2011 FINAL   Final    Studies/Results: Ct Head Wo Contrast  03/10/2011  *RADIOLOGY REPORT*  Clinical Data: Neurologic changes.  CT HEAD WITHOUT CONTRAST  Technique:  Contiguous axial images were obtained from the base of the skull through the vertex without contrast.  Comparison: CT head without contrast 03/03/2011.  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 Remote lacunar infarcts in the left basal ganglia are stable. Moderate periventricular and subcortical white matter hypoattenuation is similar to the prior exam.  No acute cortical infarct, hemorrhage, or mass lesion is present.  Ventricular dilation is proportionate to the degree of atrophy and associated with the previous infarctions.  The paranasal sinuses and mastoid air cells are clear.  The osseous skull is intact.  IMPRESSION:  1.  No acute intracranial abnormality or significant interval change. 2.  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 3.  Stable atrophy and advanced white matter changes.  Original Report Authenticated By: Jamesetta Orleans. MATTERN, M.D.   Dg Chest Port 1 View  03/10/2011  *RADIOLOGY  REPORT*  Clinical Data: Respiratory failure  PORTABLE CHEST - 1 VIEW  Comparison: 03/08/2011, 03/07/2011, 03/03/2011.  Findings: The endotracheal tube, is approximately 1.5 cm above the carina.  Right upper extremity PICC terminates near the junction of the superior vena cava right atrium.  Cardiomediastinal silhouette is stable, with cardiomegaly suspected.  The lung volumes are low bilaterally.  There is diffuse interstitial prominence, and patchy airspace disease at both lung bases and in the perihilar regions.  Aeration of the lungs has improved compared to 03/08/2011. No visible pleural effusion.  IMPRESSION:  1. Improving aeration of the lungs in this  patient with bibasilar airspace disease and interstitial prominence. 2.  Satisfactory position of support devices.  Original Report Authenticated By: Britta Mccreedy, M.D.    Medications:  Scheduled:   . albuterol  2.5 mg Nebulization Q6H  . antiseptic oral rinse  1 application Mouth Rinse QID  . aspirin  81 mg Oral Daily  . azithromycin  500 mg Intravenous Q24H  . chlorhexidine  15 mL Mouth/Throat BID  . enoxaparin  40 mg Subcutaneous Q24H  . feeding supplement  30 mL Per Tube Q2000  . free water  200 mL Per Tube Q6H  . furosemide  20 mg Intravenous Daily  . insulin aspart  0-9 Units Subcutaneous Q4H  . insulin glargine  7 Units Subcutaneous BID  . ipratropium  0.5 mg Nebulization Q6H  . methylPREDNISolone (SOLU-MEDROL) injection  60 mg Intravenous Q24H  . metoprolol tartrate  12.5 mg Oral BID  . pantoprazole sodium  40 mg Per Tube Q1200  . piperacillin-tazobactam (ZOSYN)  IV  3.375 g Intravenous Q8H  . potassium chloride  20 mEq Oral BID  . sodium chloride  10 mL Intracatheter Q12H  . vancomycin  750 mg Intravenous Q12H   Continuous:   . dextrose 5 % with KCl 20 mEq / L 20 mEq (03/12/11 0600)  . feeding supplement (JEVITY 1.2) 1,000 mL (03/11/11 0254)  . fentaNYL infusion INTRAVENOUS 100 mcg/hr (03/12/11 0600)  . midazolam (VERSED)  infusion 2 mg/hr (03/12/11 0600)   ZOX:[id number], ondansetron (ZOFRAN) IV, ondansetron, sodium chloride  Assesment: He has respiratory failure which I think is multifactorial. 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 Ronal Fear evaluation. He will be okay for extubation according to Dr. Gerilyn Pilgrim. I think he is approaching being ready for extubation. Principal Problem:  *Respiratory failure Active Problems:  CAP (community acquired pneumonia)  Toxic encephalopathy  Dehydration  Rhabdomyolysis  Benign hypertension  Hyperlipidemia  GERD (gastroesophageal reflux disease)  Chronic pain  Hypokalemia  Anemia  Elevated troponin I level  Hypernatremia  Hyperglycemia  Elevated LFTs  Non-ST elevation MI (NSTEMI)  Pulmonary edema    Plan: Weaning trial today and if he does well will plan to go ahead and extubate    LOS: 9 days   Lesta Limbert L 03/12/2011, 7:47 AM

## 2011-03-12 NOTE — Progress Notes (Signed)
PATIENT BECAME FATIGUED AFTER SBT. DR. Juanetta Gosling RECOMMENDED TO WAIT ON EXTUBATION TILL TOMORROW. MODERATE AMOUNT OF THICK YELLOW SECRETIONS SUCTIONED FROM ENDOTUBE. TUBE SECURED AT 23CM AT THE LIP.

## 2011-03-12 NOTE — Progress Notes (Signed)
UR Chart Review Completed  

## 2011-03-12 NOTE — Progress Notes (Signed)
Subjective: Patient looked to be a bit more alert. He certainly cortically blind, and as he was awake and seemed to respond to touch and sound but not to anything in terms of vision. Not yet ready for extubation.  Objective: Vital signs in last 24 hours: Filed Vitals:   03/12/11 1500 03/12/11 1600 03/12/11 1700 03/12/11 1800  BP: 130/54 105/56 92/52 96/48   Pulse: 93 93 94 91  Temp:  98.6 F (37 C)    TempSrc:  Axillary    Resp: 15 15 17 16   Height:      Weight:      SpO2: 100% 100% 99% 99%    Intake/Output Summary (Last 24 hours) at 03/12/11 1901 Last data filed at 03/12/11 1814  Gross per 24 hour  Intake 4292.08 ml  Output   2400 ml  Net 1892.08 ml    Weight change: 2.5 kg (5 lb 8.2 oz)  Physical exam: General: [name] sitting up on the ventilator. He has an ET tube inserted, and NG tube, and various telemetry leads. Lungs: Few rhonchi bilaterally Heart: S1, S2. No murmurs rubs or gallops. Abdomen: Positive bowel sounds, soft, nontender, nondistended. Extremities: Trace to 1+ bilateral upper extremity edema and resolution of pedal edema. Neurologic: He is sitting up. His eyes are open. It does not appear that he has a facial droop although it is difficult to tell with the ET tube and NG tube inserted. He does not respond to his name. He does not provide eye contact.   Lab Results: Basic Metabolic Panel:  Basename 03/12/11 0505 03/11/11 0528  NA 137 133*  K 4.0 3.7  CL 99 96  CO2 31 31  GLUCOSE 131* 143*  BUN 34* 26*  CREATININE 0.87 0.81  CALCIUM 8.7 8.7  MG -- --  PHOS -- --   CBC:  Basename 03/12/11 0505 03/11/11 0528  WBC 15.2* 22.1*  NEUTROABS -- --  HGB 8.6* 9.6*  HCT 26.2* 28.4*  MCV 96.3 95.6  PLT 439* 425*   CBG:  Basename 03/12/11 1048 03/12/11 0823 03/12/11 0417 03/12/11 0003 03/11/11 2017 03/11/11 1614  GLUCAP 239* 252* 165* 126* 131* 146*   Thyroid Function Tests:  Basename 03/10/11 2056  TSH 1.111  T4TOTAL --    FREET4 --  T3FREE --  THYROIDAB --   Anemia Panel:  Basename 03/10/11 2056  VITAMINB12 403  FOLATE --  FERRITIN --  TIBC --  IRON --  RETICCTPCT --    Medications: I have reviewed the patient's current medications.  Assessment: Principal Problem:  *Respiratory failure Active Problems:  CAP (community acquired pneumonia)  Toxic encephalopathy  Dehydration  Rhabdomyolysis  Benign hypertension  Hyperlipidemia  GERD (gastroesophageal reflux disease)  Chronic pain  Hypokalemia  Anemia  Elevated troponin I level  Hypernatremia  Hyperglycemia  Elevated LFTs  Non-ST elevation MI (NSTEMI)  Pulmonary edema  1. Acute ventilator dependent respiratory failure secondary to community-acquired pneumonia. His ABG is noted. It has been improving. Dr. Juanetta Gosling is assisting with ventilator management. He is being treated with vancomycin, Zosyn, azithromycin, and Solu-Medrol, and bronchodilator therapy. Today is day 8 of antibiotics. His fever curve has improved. White blood cell count continues to decrease His blood cultures are negative x4 so far. Solu-Medrol has been decreased to once daily. Extubation off until tomorrow  Toxic metabolic encephalopathy. CT scan unremarkable. EEG results as per neurology are unimpressive  Probable superimposed pulmonary edema, given his elevated BNP. BNP has progressively decreased with gentle Lasix daily. We'll  watch his urine output and creatinine, and serum sodium. Check a morning BNP.  Elevated cardiac enzymes consistent with a non-ST elevation myocardial infarction. Aspirin and metoprolol were started. He remains on prophylactic Lovenox. His EKG on 03/06/2011 revealed normal sinus rhythm with a heart rate of 94 beats per minute, left axis deviation, and inferior/anterior lateral ST and T wave abnormalities.  Hypertension. His blood pressure is better on metoprolol.  He is on Lotensin and hydrochlorothiazide chronically at  home.  Hypernatremia/dehydration. His serum sodium is better with the addition of free water via the tube. We'll continue to monitor and just IV fluids as needed.  Elevated liver transaminases. Resolved. This may have been secondary to rhabdomyolysis.   Hypokalemia. His blood magnesium level is within normal limits. His serum potassium is within normal limits with supplementation.  Steroid-induced hyperglycemia/secondary to tube feedings. His blood glucose is trending downward with the decrease in Solu-Medrol. Decrease Solu-Medrol to 40 mg daily and increased Lantus to 10 units.  Anemia. His hemoglobin was 12.7 on admission and has drifted downward. This may be secondary to acute and chronic disease, hemodilution, and venipuncture. If he falls below 8, consider transfusion.  Chronic pain syndrome. He is on transdermal Duragesic chronically at home.    LOS: 9 days   Adalae Baysinger K 03/12/2011, 7:01 PM
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
## 2011-03-13 ENCOUNTER — Inpatient Hospital Stay (HOSPITAL_COMMUNITY): Payer: Medicare Other

## 2011-03-13 LAB — GLUCOSE, CAPILLARY
Glucose-Capillary: 116 mg/dL — ABNORMAL HIGH (ref 70–99)
Glucose-Capillary: 170 mg/dL — ABNORMAL HIGH (ref 70–99)
Glucose-Capillary: 78 mg/dL (ref 70–99)
Glucose-Capillary: 98 mg/dL (ref 70–99)

## 2011-03-13 LAB — BASIC METABOLIC PANEL
BUN: 31 mg/dL — ABNORMAL HIGH (ref 6–23)
Calcium: 8.8 mg/dL (ref 8.4–10.5)
GFR calc non Af Amer: 83 mL/min — ABNORMAL LOW (ref >90)
Glucose, Bld: 93 mg/dL (ref 70–99)
Sodium: 133 mEq/L — ABNORMAL LOW (ref 135–145)

## 2011-03-13 LAB — BLOOD GAS, ARTERIAL
Bicarbonate: 31 mEq/L — ABNORMAL HIGH (ref 20.0–24.0)
PEEP: 5 cmH2O
pH, Arterial: 7.446 (ref 7.350–7.450)
pO2, Arterial: 100 mmHg (ref 80.0–100.0)

## 2011-03-13 LAB — VANCOMYCIN, TROUGH: Vancomycin Tr: 20.1 ug/mL — ABNORMAL HIGH (ref 10.0–20.0)

## 2011-03-13 MED ORDER — FUROSEMIDE 10 MG/ML IJ SOLN
20.0000 mg | Freq: Two times a day (BID) | INTRAMUSCULAR | Status: DC
  Administered 2011-03-14 – 2011-03-19 (×11): 20 mg via INTRAVENOUS
  Filled 2011-03-13 (×11): qty 2

## 2011-03-13 NOTE — Progress Notes (Signed)
Pt only grunts has no verbal skills ?? Sats 99 on 2lpm/[place] breath sounds diminished f 23-28 , Other than this he appears fine with no resp problems

## 2011-03-13 NOTE — Progress Notes (Signed)
Subjective: Patient seen prior to extubation. Awake and responded to 46 and touch. 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 Observed status post extubation and initially tolerating well. Coughing some.  Objective: Vital signs in last 24 hours: Filed Vitals:   03/13/11 1300 03/13/11 1315 03/13/11 1330 03/13/11 1336  BP: 127/56 123/56 140/61   Pulse: 80 78 86   Temp:      TempSrc:      Resp: 17 19 21    Height:      Weight:      SpO2: 99% 99% 100% 100%    Intake/Output Summary (Last 24 hours) at 03/13/11 1707 Last data filed at 03/13/11 1300  Gross per 24 hour  Intake 2621.99 ml  Output   2350 ml  Net 271.99 ml    Weight change: -2.3 kg (-5 lb 1.1 oz)  Physical exam: General: [name] sitting up on the ventilator. He has an ET tube inserted, and NG tube, and various telemetry leads. Lungs: Mostly clear, decreased at bases Heart: S1, S2. No murmurs rubs or gallops. Abdomen: Positive bowel sounds, soft, nontender, nondistended. Extremities: Trace to 1+ bilateral upper extremity edema and resolution of pedal edema. Neurologic: He is sitting up. His eyes are open. It does not appear that he has a facial droop although it is difficult to tell with the ET tube and NG tube inserted. He does not respond to his name. He does not provide eye contact.   Lab Results: Basic Metabolic Panel:  Basename 03/13/11 0447 03/12/11 0505  NA 133* 137  K 3.9 4.0  CL 96 99  CO2 33* 31  GLUCOSE 93 131*  BUN 31* 34*  CREATININE 0.89 0.87  CALCIUM 8.8 8.7  MG -- --  PHOS -- --   CBC:  Basename 03/12/11 0505 03/11/11 0528  WBC 15.2* 22.1*  NEUTROABS -- --  HGB 8.6* 9.6*  HCT 26.2* 28.4*  MCV 96.3 95.6  PLT 439* 425*   CBG:  Basename 03/13/11 1143 03/13/11 0739 03/13/11 0350 03/13/11 0009 03/12/11 2016 03/12/11 1532  GLUCAP 170* 116* 86 113* 132* 142*    Medications: I have reviewed the patient's current medications.  Assessment: Principal Problem:  *Respiratory failure Active  Problems:  CAP (community acquired pneumonia)  Toxic encephalopathy  Dehydration  Rhabdomyolysis  Benign hypertension  Hyperlipidemia  GERD (gastroesophageal reflux disease)  Chronic pain  Hypokalemia  Anemia  Elevated troponin I level  Hypernatremia  Hyperglycemia  Elevated LFTs  Non-ST elevation MI (NSTEMI)  Pulmonary edema  1. Acute ventilator dependent respiratory failure secondary to community-acquired pneumonia. Now status post extubation. Hopefully he will still be able to protect his airway and clear secretions. Continue to taper down side Medrol.  Toxic metabolic encephalopathy. CT scan unremarkable. EEG results as per neurology are unimpressive.  Probable superimposed pulmonary edema, given his elevated BNP. BNP has progressively decreased with gentle Lasix daily. We'll watch his urine output and creatinine, and serum sodium. BNP this morning was at 958, will increase Lasix.  Elevated cardiac enzymes consistent with a non-ST elevation myocardial infarction. Aspirin and metoprolol were started. He remains on prophylactic Lovenox. His EKG on 03/06/2011 revealed normal sinus rhythm with a heart rate of 94 beats per minute, left axis deviation, and inferior/anterior lateral ST and T wave abnormalities.  Hypertension. His blood pressure is better on metoprolol.  He is on Lotensin and hydrochlorothiazide chronically at home.  Hypernatremia/dehydration. His serum sodium is better with the addition of free water via the tube. We'll continue to monitor  and just IV fluids as needed.  Elevated liver transaminases. Resolved. This may have been secondary to rhabdomyolysis.   Hypokalemia. His blood magnesium level is within normal limits. His serum potassium is within normal limits with supplementation.  Steroid-induced hyperglycemia/secondary to tube feedings. His blood glucose is trending downward with the decrease in Solu-Medrol. Decrease Solu-Medrol to 40 mg daily and increased  Lantus to 10 units.  Anemia. His hemoglobin was 12.7 on admission and has drifted downward. This may be secondary to acute and chronic disease, hemodilution, and venipuncture. If he falls below 8, consider transfusion. Recheck CBC in the morning.  Chronic pain syndrome. He is on transdermal Duragesic chronically at home.    LOS: 10 days   Geeta Dworkin K 03/13/2011, 5:07 PM

## 2011-03-13 NOTE — Progress Notes (Signed)
Subjective: Interval History: see dictation  Objective: Vital signs in last 24 hours: Temp:  [98.1 F (36.7 C)-98.8 F (37.1 C)] 98.7 F (37.1 C) (01/04 0400) Pulse Rate:  [81-98] 81  (01/04 0600) Resp:  [13-25] 16  (01/04 0600) BP: (78-138)/(42-73) 95/51 mmHg (01/04 0600) SpO2:  [92 %-100 %] 98 % (01/04 0600) FiO2 (%):  [39.6 %-40.5 %] 40.1 % (01/04 0741) Weight:  [67 kg (147 lb 11.3 oz)] 147 lb 11.3 oz (67 kg) (01/04 0500)  Intake/Output from previous day: 01/03 0701 - 01/04 0700 In: 3890.3 [I.V.:1528.3; NG/GT:1560; IV Piggyback:802] Out: 3100 [Urine:2900; Emesis/NG output:200] Intake/Output this shift:   Nutritional status:     Lab Results:  Basename 03/13/11 0447 03/12/11 0505 03/11/11 0528  WBC -- 15.2* 22.1*  HGB -- 8.6* 9.6*  HCT -- 26.2* 28.4*  PLT -- 439* 425*  NA 133* 137 --  K 3.9 4.0 --  CL 96 99 --  CO2 33* 31 --  GLUCOSE 93 131* --  BUN 31* 34* --  CREATININE 0.89 0.87 --  CALCIUM 8.8 8.7 --  LABA1C -- -- --   Lipid Panel No results found for this basename: CHOL,TRIG,HDL,CHOLHDL,VLDL,LDLCALC in the last 72 hours  Studies/Results: Dg Chest Port 1 View  03/13/2011  *RADIOLOGY REPORT*  Clinical Data: Respiratory failure.  PORTABLE CHEST - 1 VIEW  Comparison: 03/10/2011  Findings: Endotracheal tube is 3.4 cm above the carina.  Patchy interstitial densities in the lower lungs have minimally changed. Heart size is stable.  Nasogastric tube has been pulled back and the tip is in the distal esophagus.  Right arm PICC line tip is near the right atrium. Chronic elevation of the right humeral head.  IMPRESSION: Minimal change in the basilar interstitial lung densities.  Nasogastric tube has been pulled back.  The tip is in the distal esophagus.  This finding was discussed with the patient's nurse, Abby, on 03/13/2011 at 7:42 a.m.  Original Report Authenticated By: Richarda Overlie, M.D.    Medications:  Scheduled Meds:    . albuterol  2.5 mg Nebulization Q6H  .  antiseptic oral rinse  1 application Mouth Rinse QID  . aspirin  81 mg Oral Daily  . azithromycin  500 mg Intravenous Q24H  . chlorhexidine  15 mL Mouth/Throat BID  . enoxaparin  40 mg Subcutaneous Q24H  . feeding supplement  30 mL Per Tube Q2000  . free water  200 mL Per Tube Q6H  . furosemide  20 mg Intravenous Daily  . insulin aspart  0-9 Units Subcutaneous Q4H  . insulin glargine  10 Units Subcutaneous BID  . ipratropium  0.5 mg Nebulization Q6H  . methylPREDNISolone (SOLU-MEDROL) injection  40 mg Intravenous Q24H  . metoprolol tartrate  12.5 mg Oral BID  . pantoprazole sodium  40 mg Per Tube Q1200  . piperacillin-tazobactam (ZOSYN)  IV  3.375 g Intravenous Q8H  . potassium chloride  20 mEq Oral BID  . sodium chloride  10 mL Intracatheter Q12H  . vancomycin  1,000 mg Intravenous Q12H  . DISCONTD: insulin glargine  12 Units Subcutaneous BID  . DISCONTD: insulin glargine  7 Units Subcutaneous BID  . DISCONTD: methylPREDNISolone (SOLU-MEDROL) injection  60 mg Intravenous Q24H  . DISCONTD: vancomycin  750 mg Intravenous Q12H   Continuous Infusions:    . dextrose 5 % with KCl 20 mEq / L 20 mEq (03/13/11 0600)  . feeding supplement (JEVITY 1.2) 1,000 mL (03/12/11 1618)  . fentaNYL infusion INTRAVENOUS Stopped (03/13/11 0700)  .  midazolam (VERSED) infusion Stopped (03/13/11 0700)   PRN Meds:.acetaminophen, ondansetron (ZOFRAN) IV, ondansetron, sodium chloride   Assessment/Plan: see dictation   LOS: 10 days   Marcus [name]

## 2011-03-13 NOTE — Progress Notes (Signed)
Pt sedation stopped at this time. Respiratory in with patient attempting to wean from vent.

## 2011-03-13 NOTE — Procedures (Signed)
Extubation Procedure Note  Patient Details:   Name: Marcus [name] DOB: 13-Aug-1940 MRN: [id number]   Airway Documentation:  Airway 7.5 mm (Active)  Secured at (cm) 21 cm 03/13/2011  7:41 AM  Measured From Lips 03/13/2011  7:41 AM  Secured Location Center 03/13/2011  7:41 AM  Secured By [employer] 03/13/2011  7:41 AM  Tube Holder Repositioned Yes 03/13/2011  7:41 AM  Cuff Pressure (cm H2O) 28 cm H2O 03/12/2011  8:02 PM  Site Condition Dry 03/13/2011  7:41 AM    Evaluation  O2 sats: stable throughout Complications: No apparent complications Patient did tolerate procedure well. Bilateral Breath Sounds: Diminished Suctioning: Oral No Pt had been on cpap 5/5 for almost 3 hours, pt unable to do vital capacity, nif was -25.  Dr. Juanetta Gosling gave the order to extubate, pt tolerated well, no problems during extubation.  Saturation now on 40% venturi mask is 96%.  BBS are diminished, he does have some secretions in the lower/back part of his throat.  He has been coughing to clear the secretions.  Will continue to monitor. Jerelyn Scott Billingsley 03/13/2011, 11:33 AM

## 2011-03-13 NOTE — Progress Notes (Signed)
Upon entering room staff found tan colored emesis on gown and sheets.  Patient was cleaned, bathed, linens changed.  Patient continued to have episodes of emesis.  Oral suction performed to clean out mouth.  OG tube placed to suction and Tube Feeding stopped.  Zofran IV given.  MD notified.  Will continue to monitor for further episodes of emesis.

## 2011-03-13 NOTE — Progress Notes (Signed)
Subjective: We attempted weaning yesterday and he did pretty well but he started having a lot of difficulty with fatigue after about 2 hours was put back on the ventilator. It is noted that he has had some vomiting during the night. His tube feedings been discontinued for now.  Objective: Vital signs in last 24 hours: Temp:  [98.1 F (36.7 C)-98.8 F (37.1 C)] 98.7 F (37.1 C) (01/04 0400) Pulse Rate:  [81-98] 81  (01/04 0600) Resp:  [13-25] 16  (01/04 0600) BP: (78-138)/(42-73) 95/51 mmHg (01/04 0600) SpO2:  [92 %-100 %] 98 % (01/04 0600) FiO2 (%):  [39.6 %-40.5 %] 40.2 % (01/04 0600) Weight:  [67 kg (147 lb 11.3 oz)] 147 lb 11.3 oz (67 kg) (01/04 0500) Weight change: -2.3 kg (-5 lb 1.1 oz) Last BM Date: 03/12/11  Intake/Output from previous day: 01/03 0701 - 01/04 0700 In: 3890.3 [I.V.:1528.3; NG/GT:1560; IV Piggyback:802] Out: 3100 [Urine:2900; Emesis/NG output:200]  PHYSICAL EXAM General appearance: Intubated on the ventilator Resp: rhonchi bilaterally Cardio: regular rate and rhythm, S1, S2 normal, no murmur, click, rub or gallop GI: soft, non-tender; bowel sounds normal; no masses,  no organomegaly Extremities: extremities normal, atraumatic, no cyanosis or edema  Lab Results:    Basic Metabolic Panel:  Basename 03/13/11 0447 03/12/11 0505  NA 133* 137  K 3.9 4.0  CL 96 99  CO2 33* 31  GLUCOSE 93 131*  BUN 31* 34*  CREATININE 0.89 0.87  CALCIUM 8.8 8.7  MG -- --  PHOS -- --   Liver Function Tests: No results found for this basename: AST:2,ALT:2,ALKPHOS:2,BILITOT:2,PROT:2,ALBUMIN:2 in the last 72 hours No results found for this basename: LIPASE:2,AMYLASE:2 in the last 72 hours No results found for this basename: AMMONIA:2 in the last 72 hours CBC:  Basename 03/12/11 0505 03/11/11 0528  WBC 15.2* 22.1*  NEUTROABS -- --  HGB 8.6* 9.6*  HCT 26.2* 28.4*  MCV 96.3 95.6  PLT 439* 425*   Cardiac Enzymes: No results found for this basename:  CKTOTAL:3,CKMB:3,CKMBINDEX:3,TROPONINI:3 in the last 72 hours BNP:  [facility] 03/13/11 0449  PROBNP 958.8*   D-Dimer: No results found for this basename: DDIMER:2 in the last 72 hours CBG:  Basename 03/13/11 0350 03/13/11 0009 03/12/11 2016 03/12/11 1532 03/12/11 1048 03/12/11 0823  GLUCAP 86 113* 132* 142* 239* 252*   Hemoglobin A1C: No results found for this basename: HGBA1C in the last 72 hours Fasting Lipid Panel: No results found for this basename: CHOL,HDL,LDLCALC,TRIG,CHOLHDL,LDLDIRECT in the last 72 hours Thyroid Function Tests:  Basename 03/10/11 2056  TSH 1.111  T4TOTAL --  FREET4 --  T3FREE --  THYROIDAB --   Anemia Panel:  Basename 03/10/11 2056  VITAMINB12 403  FOLATE --  FERRITIN --  TIBC --  IRON --  RETICCTPCT --   Coagulation: No results found for this basename: LABPROT:2,INR:2 in the last 72 hours Urine Drug Screen: Drugs of Abuse  No results found for this basename: labopia, cocainscrnur, labbenz, amphetmu, thcu, labbarb    Alcohol Level: No results found for this basename: ETH:2 in the last 72 hours Urinalysis:  Misc. Labs:  ABGS  Basename 03/13/11 0600  PHART 7.446  PO2ART 100.0  TCO2 28.7  HCO3 31.0*   CULTURES Recent Results (from the past 240 hour(s))  CULTURE, BLOOD (ROUTINE X 2)     Status: Normal   Collection Time   03/03/11  3:05 PM      Component Value Range Status Comment   Specimen Description BLOOD RIGHT ANTECUBITAL   Final  Special Requests     Final    Value: BOTTLES DRAWN AEROBIC AND ANAEROBIC 6CC EACH BOTTLE   Culture NO GROWTH 5 DAYS   Final    Report Status 03/08/2011 FINAL   Final   CULTURE, BLOOD (ROUTINE X 2)     Status: Normal   Collection Time   03/03/11  4:31 PM      Component Value Range Status Comment   Specimen Description BLOOD RIGHT HAND   Final    Special Requests BOTTLES DRAWN AEROBIC ONLY 6CC BOTTLE   Final    Culture NO GROWTH 5 DAYS   Final    Report Status 03/08/2011 FINAL   Final   MRSA  PCR SCREENING     Status: Normal   Collection Time   03/03/11  6:35 PM      Component Value Range Status Comment   MRSA by PCR NEGATIVE  NEGATIVE  Final   CULTURE, BLOOD (ROUTINE X 2)     Status: Normal   Collection Time   03/06/11  8:38 AM      Component Value Range Status Comment   Specimen Description BLOOD SITE NOT SPECIFIED DRAWN BY RN   Final    Special Requests BOTTLES DRAWN AEROBIC AND ANAEROBIC 6CC   Final    Culture NO GROWTH 5 DAYS   Final    Report Status 03/11/2011 FINAL   Final   CULTURE, BLOOD (ROUTINE X 2)     Status: Normal   Collection Time   03/06/11  8:56 AM      Component Value Range Status Comment   Specimen Description BLOOD LEFT ARM   Final    Special Requests BOTTLES DRAWN AEROBIC AND ANAEROBIC 6CC   Final    Culture NO GROWTH 5 DAYS   Final    Report Status 03/11/2011 FINAL   Final    Studies/Results: No results found.  Medications:  Scheduled:   . albuterol  2.5 mg Nebulization Q6H  . antiseptic oral rinse  1 application Mouth Rinse QID  . aspirin  81 mg Oral Daily  . azithromycin  500 mg Intravenous Q24H  . chlorhexidine  15 mL Mouth/Throat BID  . enoxaparin  40 mg Subcutaneous Q24H  . feeding supplement  30 mL Per Tube Q2000  . free water  200 mL Per Tube Q6H  . furosemide  20 mg Intravenous Daily  . insulin aspart  0-9 Units Subcutaneous Q4H  . insulin glargine  10 Units Subcutaneous BID  . ipratropium  0.5 mg Nebulization Q6H  . methylPREDNISolone (SOLU-MEDROL) injection  40 mg Intravenous Q24H  . metoprolol tartrate  12.5 mg Oral BID  . pantoprazole sodium  40 mg Per Tube Q1200  . piperacillin-tazobactam (ZOSYN)  IV  3.375 g Intravenous Q8H  . potassium chloride  20 mEq Oral BID  . sodium chloride  10 mL Intracatheter Q12H  . vancomycin  1,000 mg Intravenous Q12H  . DISCONTD: insulin glargine  12 Units Subcutaneous BID  . DISCONTD: insulin glargine  7 Units Subcutaneous BID  . DISCONTD: methylPREDNISolone (SOLU-MEDROL) injection  60 mg  Intravenous Q24H  . DISCONTD: vancomycin  750 mg Intravenous Q12H   Continuous:   . dextrose 5 % with KCl 20 mEq / L 20 mEq (03/13/11 0600)  . feeding supplement (JEVITY 1.2) 1,000 mL (03/12/11 1618)  . fentaNYL infusion INTRAVENOUS 300 mcg/hr (03/13/11 0600)  . midazolam (VERSED) infusion 4 mg/hr (03/13/11 0600)   WUJ:[id number], ondansetron (ZOFRAN) IV, ondansetron, sodium chloride  Assesment: He has multifactorial respiratory failure but seems better. He had some nausea and vomiting during the night. His tube feedings been discontinued. He has no other new problems noted. He tired with weaning attempts yesterday. Principal Problem:  *Respiratory failure Active Problems:  CAP (community acquired pneumonia)  Toxic encephalopathy  Dehydration  Rhabdomyolysis  Benign hypertension  Hyperlipidemia  GERD (gastroesophageal reflux disease)  Chronic pain  Hypokalemia  Anemia  Elevated troponin I level  Hypernatremia  Hyperglycemia  Elevated LFTs  Non-ST elevation MI (NSTEMI)  Pulmonary edema    Plan: Wean again today and hopefully he'll be able to be extubated    LOS: 10 days   Andora Krull L 03/13/2011, 7:37 AM

## 2011-03-13 NOTE — Progress Notes (Signed)
Per Radiology patients OG tube was out at the esophagus. OG tube advanced and placement checked by auscultation and aspiration of gastric contents.

## 2011-03-13 NOTE — Consult Note (Signed)
ANTIBIOTIC CONSULT NOTE   Pharmacy Consult for Vancomycin Indication: pneumonia  No Known Allergies  Patient Measurements: Height: 5\' 4"  (162.6 cm) Weight: 147 lb 11.3 oz (67 kg) IBW/kg (Calculated) : 59.2   Vital Signs: Temp: 98.5 F (36.9 C) (01/04 0800) Temp src: Oral (01/04 0800) BP: 110/53 mmHg (01/04 0930) Pulse Rate: 77  (01/04 0930) Intake/Output from previous day: 01/03 0701 - 01/04 0700 In: 3890.3 [I.V.:1528.3; NG/GT:1560; IV Piggyback:802] Out: 3100 [Urine:2900; Emesis/NG output:200] Intake/Output from this shift:    Labs:  Basename 03/13/11 0447 03/12/11 0505 03/11/11 0528  WBC -- 15.2* 22.1*  HGB -- 8.6* 9.6*  PLT -- 439* 425*  LABCREA -- -- --  CREATININE 0.89 0.87 0.81   Estimated Creatinine Clearance: 62.8 ml/min (by C-G formula based on Cr of 0.89).  Basename 03/13/11 1032 03/12/11 0935  VANCOTROUGH 20.1* 9.1*  VANCOPEAK -- --  Drue Dun -- --  GENTTROUGH -- --  GENTPEAK -- --  GENTRANDOM -- --  TOBRATROUGH -- --  TOBRAPEAK -- --  TOBRARND -- --  AMIKACINPEAK -- --  AMIKACINTROU -- --  AMIKACIN -- --    Microbiology: Recent Results (from the past 720 hour(s))  CULTURE, BLOOD (ROUTINE X 2)     Status: Normal   Collection Time   03/03/11  3:05 PM      Component Value Range Status Comment   Specimen Description BLOOD RIGHT ANTECUBITAL   Final    Special Requests     Final    Value: BOTTLES DRAWN AEROBIC AND ANAEROBIC 6CC EACH BOTTLE   Culture NO GROWTH 5 DAYS   Final    Report Status 03/08/2011 FINAL   Final   CULTURE, BLOOD (ROUTINE X 2)     Status: Normal   Collection Time   03/03/11  4:31 PM      Component Value Range Status Comment   Specimen Description BLOOD RIGHT HAND   Final    Special Requests BOTTLES DRAWN AEROBIC ONLY 6CC BOTTLE   Final    Culture NO GROWTH 5 DAYS   Final    Report Status 03/08/2011 FINAL   Final   MRSA PCR SCREENING     Status: Normal   Collection Time   03/03/11  6:35 PM      Component Value Range  Status Comment   MRSA by PCR NEGATIVE  NEGATIVE  Final   CULTURE, BLOOD (ROUTINE X 2)     Status: Normal   Collection Time   03/06/11  8:38 AM      Component Value Range Status Comment   Specimen Description BLOOD SITE NOT SPECIFIED DRAWN BY RN   Final    Special Requests BOTTLES DRAWN AEROBIC AND ANAEROBIC 6CC   Final    Culture NO GROWTH 5 DAYS   Final    Report Status 03/11/2011 FINAL   Final   CULTURE, BLOOD (ROUTINE X 2)     Status: Normal   Collection Time   03/06/11  8:56 AM      Component Value Range Status Comment   Specimen Description BLOOD LEFT ARM   Final    Special Requests BOTTLES DRAWN AEROBIC AND ANAEROBIC 6CC   Final    Culture NO GROWTH 5 DAYS   Final    Report Status 03/11/2011 FINAL   Final    Medical History: Past Medical History  Diagnosis Date  . Hypertension   . GERD (gastroesophageal reflux disease)   . Hypercholesteremia   . Stroke   . Anemia 03/06/2011  .  Smoker   . Non-ST elevation MI (NSTEMI) 03/07/2011    With PNA and resp failure   Medications:  Scheduled:     . albuterol  2.5 mg Nebulization Q6H  . antiseptic oral rinse  1 application Mouth Rinse QID  . aspirin  81 mg Oral Daily  . azithromycin  500 mg Intravenous Q24H  . chlorhexidine  15 mL Mouth/Throat BID  . enoxaparin  40 mg Subcutaneous Q24H  . feeding supplement  30 mL Per Tube Q2000  . free water  200 mL Per Tube Q6H  . furosemide  20 mg Intravenous Daily  . insulin aspart  0-9 Units Subcutaneous Q4H  . insulin glargine  10 Units Subcutaneous BID  . ipratropium  0.5 mg Nebulization Q6H  . methylPREDNISolone (SOLU-MEDROL) injection  40 mg Intravenous Q24H  . metoprolol tartrate  12.5 mg Oral BID  . pantoprazole sodium  40 mg Per Tube Q1200  . piperacillin-tazobactam (ZOSYN)  IV  3.375 g Intravenous Q8H  . potassium chloride  20 mEq Oral BID  . sodium chloride  10 mL Intracatheter Q12H  . vancomycin  1,000 mg Intravenous Q12H  . DISCONTD: insulin glargine  12 Units  Subcutaneous BID  . DISCONTD: insulin glargine  7 Units Subcutaneous BID  . DISCONTD: methylPREDNISolone (SOLU-MEDROL) injection  60 mg Intravenous Q24H   Assessment: Trough on target (right at 20) Renal fxn stable  Goal of Therapy:  Vancomycin trough level 15-20 mcg/ml  Plan: continue Vancomycin to 1000 mg iv q12hrs Labs per protocol  Valrie Hart A 03/13/2011,11:21 AM

## 2011-03-14 LAB — BASIC METABOLIC PANEL
Calcium: 9 mg/dL (ref 8.4–10.5)
Creatinine, Ser: 0.86 mg/dL (ref 0.50–1.35)
GFR calc Af Amer: >90 mL/min (ref >90)
GFR calc non Af Amer: 85 mL/min — ABNORMAL LOW (ref >90)

## 2011-03-14 LAB — GLUCOSE, CAPILLARY
Glucose-Capillary: 79 mg/dL (ref 70–99)
Glucose-Capillary: 79 mg/dL (ref 70–99)
Glucose-Capillary: 87 mg/dL (ref 70–99)
Glucose-Capillary: 90 mg/dL (ref 70–99)

## 2011-03-14 LAB — CBC
MCH: 31.3 pg (ref 26.0–34.0)
MCHC: 32.8 g/dL (ref 30.0–36.0)
MCV: 95.4 fL (ref 78.0–100.0)
Platelets: 559 10*3/uL — ABNORMAL HIGH (ref 150–400)
RDW: 12.7 % (ref 11.5–15.5)

## 2011-03-14 MED ORDER — METHYLPREDNISOLONE SODIUM SUCC 40 MG IJ SOLR
30.0000 mg | INTRAMUSCULAR | Status: DC
  Administered 2011-03-15: 30 mg via INTRAVENOUS
  Filled 2011-03-14: qty 1

## 2011-03-14 MED ORDER — POTASSIUM CHLORIDE 10 MEQ/100ML IV SOLN
10.0000 meq | INTRAVENOUS | Status: AC
  Administered 2011-03-14 (×4): 10 meq via INTRAVENOUS
  Filled 2011-03-14: qty 400

## 2011-03-14 NOTE — Progress Notes (Signed)
Subjective: He was able to be extubated yesterday. He has done okay with that but he is still poorly responsive and has little in the way of any sort of purposeful response.  Objective: Vital signs in last 24 hours: Temp:  [98 F (36.7 C)-99.5 F (37.5 C)] 98.3 F (36.8 C) (01/05 0414) Pulse Rate:  [70-93] 72  (01/05 0815) Resp:  [13-31] 30  (01/05 0815) BP: (107-146)/(45-73) 136/53 mmHg (01/05 0800) SpO2:  [88 %-100 %] 90 % (01/05 0815) FiO2 (%):  [35 %-40.1 %] 40 % (01/05 0815) Weight change:  Last BM Date: 03/12/11  Intake/Output from previous day: 01/04 0701 - 01/05 0700 In: 1514 [I.V.:960; IV Piggyback:554] Out: 3425 [Urine:3425]  PHYSICAL EXAM General appearance: Poorly responsive but he does move around when he is spoken to Resp: rhonchi bilaterally Cardio: regular rate and rhythm, S1, S2 normal, no murmur, click, rub or gallop GI: soft, non-tender; bowel sounds normal; no masses,  no organomegaly Extremities: extremities normal, atraumatic, no cyanosis or edema  Lab Results:    Basic Metabolic Panel:  Basename 03/14/11 0459 03/13/11 0447  NA 130* 133*  K 2.7* 3.9  CL 93* 96  CO2 30 33*  GLUCOSE 83 93  BUN 23 31*  CREATININE 0.86 0.89  CALCIUM 9.0 8.8  MG -- --  PHOS -- --   Liver Function Tests: No results found for this basename: AST:2,ALT:2,ALKPHOS:2,BILITOT:2,PROT:2,ALBUMIN:2 in the last 72 hours No results found for this basename: LIPASE:2,AMYLASE:2 in the last 72 hours No results found for this basename: AMMONIA:2 in the last 72 hours CBC:  Basename 03/14/11 0459 03/12/11 0505  WBC 13.1* 15.2*  NEUTROABS -- --  HGB 8.8* 8.6*  HCT 26.8* 26.2*  MCV 95.4 96.3  PLT 559* 439*   Cardiac Enzymes: No results found for this basename: CKTOTAL:3,CKMB:3,CKMBINDEX:3,TROPONINI:3 in the last 72 hours BNP:  [facility] 03/13/11 0449  PROBNP 958.8*   D-Dimer: No results found for this basename: DDIMER:2 in the last 72 hours CBG:  Basename 03/14/11 0754  03/14/11 0356 03/13/11 2351 03/13/11 2013 03/13/11 1656 03/13/11 1143  GLUCAP 96 92 78 81 98 170*   Hemoglobin A1C: No results found for this basename: HGBA1C in the last 72 hours Fasting Lipid Panel: No results found for this basename: CHOL,HDL,LDLCALC,TRIG,CHOLHDL,LDLDIRECT in the last 72 hours Thyroid Function Tests: No results found for this basename: TSH,T4TOTAL,FREET4,T3FREE,THYROIDAB in the last 72 hours Anemia Panel: No results found for this basename: VITAMINB12,FOLATE,FERRITIN,TIBC,IRON,RETICCTPCT in the last 72 hours Coagulation: No results found for this basename: LABPROT:2,INR:2 in the last 72 hours Urine Drug Screen: Drugs of Abuse  No results found for this basename: labopia, cocainscrnur, labbenz, amphetmu, thcu, labbarb    Alcohol Level: No results found for this basename: ETH:2 in the last 72 hours Urinalysis:  Misc. Labs:  ABGS  Basename 03/13/11 0600  PHART 7.446  PO2ART 100.0  TCO2 28.7  HCO3 31.0*   CULTURES Recent Results (from the past 240 hour(s))  CULTURE, BLOOD (ROUTINE X 2)     Status: Normal   Collection Time   03/06/11  8:38 AM      Component Value Range Status Comment   Specimen Description BLOOD SITE NOT SPECIFIED DRAWN BY RN   Final    Special Requests BOTTLES DRAWN AEROBIC AND ANAEROBIC 6CC   Final    Culture NO GROWTH 5 DAYS   Final    Report Status 03/11/2011 FINAL   Final   CULTURE, BLOOD (ROUTINE X 2)     Status: Normal  Collection Time   03/06/11  8:56 AM      Component Value Range Status Comment   Specimen Description BLOOD LEFT ARM   Final    Special Requests BOTTLES DRAWN AEROBIC AND ANAEROBIC [facility]   Final    Culture NO GROWTH 5 DAYS   Final    Report Status 03/11/2011 FINAL   Final    Studies/Results: Dg Chest Port 1 View  03/13/2011  *RADIOLOGY REPORT*  Clinical Data: Respiratory failure.  PORTABLE CHEST - 1 VIEW  Comparison: 03/10/2011  Findings: Endotracheal tube is 3.4 cm above the carina.  Patchy interstitial densities  in the lower lungs have minimally changed. Heart size is stable.  Nasogastric tube has been pulled back and the tip is in the distal esophagus.  Right arm PICC line tip is near the right atrium. Chronic elevation of the right humeral head.  IMPRESSION: Minimal change in the basilar interstitial lung densities.  Nasogastric tube has been pulled back.  The tip is in the distal esophagus.  This finding was discussed with the patient's nurse, Abby, on 03/13/2011 at 7:42 a.m.  Original Report Authenticated By: Richarda Overlie, M.D.    Medications:  Scheduled:   . albuterol  2.5 mg Nebulization Q6H  . antiseptic oral rinse  1 application Mouth Rinse QID  . aspirin  81 mg Oral Daily  . azithromycin  500 mg Intravenous Q24H  . chlorhexidine  15 mL Mouth/Throat BID  . enoxaparin  40 mg Subcutaneous Q24H  . furosemide  20 mg Intravenous Q12H  . insulin aspart  0-9 Units Subcutaneous Q4H  . insulin glargine  10 Units Subcutaneous BID  . ipratropium  0.5 mg Nebulization Q6H  . methylPREDNISolone (SOLU-MEDROL) injection  40 mg Intravenous Q24H  . metoprolol tartrate  12.5 mg Oral BID  . pantoprazole sodium  40 mg Per Tube Q1200  . piperacillin-tazobactam (ZOSYN)  IV  3.375 g Intravenous Q8H  . potassium chloride  10 mEq Intravenous Q1 Hr x 4  . potassium chloride  20 mEq Oral BID  . sodium chloride  10 mL Intracatheter Q12H  . vancomycin  1,000 mg Intravenous Q12H  . DISCONTD: feeding supplement  30 mL Per Tube Q2000  . DISCONTD: free water  200 mL Per Tube Q6H  . DISCONTD: furosemide  20 mg Intravenous Daily   Continuous:   . dextrose 5 % with KCl 20 mEq / L 20 mEq (03/14/11 0800)  . feeding supplement (JEVITY 1.2) 1,000 mL (03/12/11 1618)  . fentaNYL infusion INTRAVENOUS Stopped (03/13/11 0700)  . midazolam (VERSED) infusion Stopped (03/13/11 0700)   ZOX:[id number], ondansetron (ZOFRAN) IV, ondansetron, sodium chloride  Assesment: He has had an episode of respiratory failure and multiple other  medical problems. Has had multifocal pneumonia. He also had what looked like some pulmonary edema. He is improved but now he is having neurological issues Principal Problem:  *Respiratory failure Active Problems:  CAP (community acquired pneumonia)  Toxic encephalopathy  Dehydration  Rhabdomyolysis  Benign hypertension  Hyperlipidemia  GERD (gastroesophageal reflux disease)  Chronic pain  Hypokalemia  Anemia  Elevated troponin I level  Hypernatremia  Hyperglycemia  Elevated LFTs  Non-ST elevation MI (NSTEMI)  Pulmonary edema    Plan: No change in his treatments. From a respiratory point of view he seems to be improving    LOS: 11 days   Gaelan Glennon L 03/14/2011, 9:20 AM

## 2011-03-14 NOTE — Progress Notes (Signed)
Subjective: Patient now extubated x1 day. Seems to be protecting his airway. His other cognition however seems to be deficient. His outlook is able for him to take food on his own.  Objective: Vital signs in last 24 hours: Filed Vitals:   03/14/11 1330 03/14/11 1345 03/14/11 1400 03/14/11 1415  BP:   162/57   Pulse: 80 79 80 83  Temp:      TempSrc:      Resp: 22 26 24 25   Height:      Weight:      SpO2: 93% 96% 92% 93%    Intake/Output Summary (Last 24 hours) at 03/14/11 1503 Last data filed at 03/14/11 1400  Gross per 24 hour  Intake   1954 ml  Output   3475 ml  Net  -1521 ml    Weight change:   Physical exam: General: [name] sitting up on the ventilator. He has an ET tube inserted, and NG tube, and various telemetry leads. Lungs: Mostly clear, decreased at bases Heart: S1, S2. No murmurs rubs or gallops. Abdomen: Positive bowel sounds, soft, nontender, nondistended. Extremities: Trace to 1+ bilateral upper extremity edema and resolution of pedal edema. Neurologic: He is sitting up. His eyes are open. He does not really respond anything to painful stimuli.  Lab Results: Basic Metabolic Panel:  Basename 03/14/11 0459 03/13/11 0447  NA 130* 133*  Marcus [name] 2.7* 3.9  CL 93* 96  CO2 30 33*  GLUCOSE 83 93  BUN 23 31*  CREATININE 0.86 0.89  CALCIUM 9.0 8.8  MG -- --  PHOS -- --   CBC:  Basename 03/14/11 0459 03/12/11 0505  WBC 13.1* 15.2*  NEUTROABS -- --  HGB 8.8* 8.6*  HCT 26.8* 26.2*  MCV 95.4 96.3  PLT 559* 439*   CBG:  Basename 03/14/11 1157 03/14/11 0754 03/14/11 0356 03/13/11 2351 03/13/11 2013 03/13/11 1656  GLUCAP 79 96 92 78 81 98    Medications: I have reviewed the patient's current medications.  Assessment: Principal Problem:  *Respiratory failure Active Problems:  CAP (community acquired pneumonia)  Toxic encephalopathy  Dehydration  Rhabdomyolysis  Benign hypertension  Hyperlipidemia  GERD (gastroesophageal reflux  disease)  Chronic pain  Hypokalemia  Anemia  Elevated troponin I level  Hypernatremia  Hyperglycemia  Elevated LFTs  Non-ST elevation MI (NSTEMI)  Pulmonary edema  1. Acute ventilator dependent respiratory failure secondary to community-acquired pneumonia. Now status post extubation. Hopefully he will still be able to protect his airway and clear secretions. Continue to taper down side Medrol.  Toxic metabolic encephalopathy. CT scan unremarkable. EEG results as per neurology are unimpressive. He continues to remain cognitively deficient. I've spoken to his daughter who will speak to his other daughter and they will decide on long-term care plans. I suspect likely he will need placement. In addition because he is not able take by mouth safely, we'll go ahead and insert pain the tube and start tube feedings that way. I spoken with the patient's daughter who is power of attorney and she definitely wants to proceed with a PEG tube. Will discuss with surgery.  Probable superimposed pulmonary edema, given his elevated BNP. BNP has progressively decreased with gentle Lasix daily. We'll watch his urine output and creatinine, and serum sodium. Recheck his BNP tomorrow.  Elevated cardiac enzymes consistent with a non-ST elevation myocardial infarction. Aspirin and metoprolol were started. He remains on prophylactic Lovenox. His EKG on 03/06/2011 revealed normal sinus rhythm with a heart rate of 94 beats  per minute, left axis deviation, and inferior/anterior lateral ST and T wave abnormalities.  Hypertension. His blood pressure is better on metoprolol.  He is on Lotensin and hydrochlorothiazide chronically at home.  Hypernatremia/dehydration. His serum sodium is better with the addition of free water via the tube. We'll continue to monitor and just IV fluids as needed.  Elevated liver transaminases. Resolved. This may have been secondary to rhabdomyolysis.   Hypokalemia. His blood magnesium level is  within normal limits. His serum potassium is within normal limits with supplementation.  Steroid-induced hyperglycemia/secondary to tube feedings. His blood glucose is trending downward with the decrease in Solu-Medrol. Discontinue Lantus. Anemia. His hemoglobin was 12.7 on admission and has drifted downward. This may be secondary to acute and chronic disease, hemodilution, and venipuncture. If he falls below 8, consider transfusion. Recheck CBC in the morning.  Chronic pain syndrome. He is on transdermal Duragesic chronically at home.    LOS: 11 days   Marcus Marcus [name] 03/14/2011, 3:03 PM

## 2011-03-14 NOTE — Progress Notes (Signed)
PT OPENING EYES. NO VERBAL COMMUNICATIO. REFUSES TO EAT. WILL SPIT OUT FOOD. SOME EYE CONTACT. DOES NOT FOLLOW ANY COMMANDS. RESISTS PHYSICAL MANIPULATION SUCH AS TURNING.. DOES RESPOND TO PAINFUL STIMULATION.

## 2011-03-14 NOTE — Progress Notes (Signed)
Marcus [name], Marcus [name]              ACCOUNT NO.:  [id number]  MEDICAL RECORD NO.:  [id number]  LOCATION:  IC04                          FACILITY:  [facility]  PHYSICIAN:  Milbert Bixler A. Gerilyn Pilgrim, M.D. DATE OF BIRTH:  1940-05-19  DATE OF PROCEDURE:  03/13/2011 DATE OF DISCHARGE:                                PROGRESS NOTE   The patient has been little hypotensive, although there have been some variation to this, more alert today.  He is lying in bed with eyes closed, but open his eyes to verbal commands.  He also shrugged his shoulders when I told him to hold up 2 fingers.  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  His brainstem reflexes are intact.  Pupils are reactive.  Oculocephalic reflexes are intact. Coronary reflexes are intact.  Again, he has antigravity strength that is purposeful on the right.  On the left, there is 2/5.  ASSESSMENT AND PLAN:  Altered mentation due to multiple etiologies with multiple toxic metabolic causes included dehydration and pneumonia.  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  We will continue to follow the patient with his labs which were ordered, actually came back fine.  The TSH 1.1, vitamin B12 level 403 up and interactive, homocystine 11.4.  We will continue to follow the patient.     Reilly Blades A. Gerilyn Pilgrim, M.D.     KAD/MEDQ  D:  03/13/2011  T:  03/14/2011  Job:  [id number]

## 2011-03-15 ENCOUNTER — Inpatient Hospital Stay (HOSPITAL_COMMUNITY): Payer: Medicare Other

## 2011-03-15 LAB — GLUCOSE, CAPILLARY
Glucose-Capillary: 139 mg/dL — ABNORMAL HIGH (ref 70–99)
Glucose-Capillary: 103 mg/dL — ABNORMAL HIGH (ref 70–99)

## 2011-03-15 LAB — CBC
Hemoglobin: 10 g/dL — ABNORMAL LOW (ref 13.0–17.0)
MCHC: 34.6 g/dL (ref 30.0–36.0)
Platelets: 643 10*3/uL — ABNORMAL HIGH (ref 150–400)
RBC: 3.16 MIL/uL — ABNORMAL LOW (ref 4.22–5.81)

## 2011-03-15 LAB — URINALYSIS, ROUTINE W REFLEX MICROSCOPIC
Glucose, UA: NEGATIVE mg/dL
Ketones, ur: NEGATIVE mg/dL
Leukocytes, UA: NEGATIVE
Nitrite: NEGATIVE
Protein, ur: 30 mg/dL — AB
Urobilinogen, UA: 2 mg/dL — ABNORMAL HIGH (ref 0.0–1.0)

## 2011-03-15 LAB — BASIC METABOLIC PANEL
CO2: 27 mEq/L (ref 19–32)
Chloride: 90 mEq/L — ABNORMAL LOW (ref 96–112)
Creatinine, Ser: 0.83 mg/dL (ref 0.50–1.35)
GFR calc Af Amer: >90 mL/min (ref >90)
Potassium: 3.3 mEq/L — ABNORMAL LOW (ref 3.5–5.1)

## 2011-03-15 LAB — URINE MICROSCOPIC-ADD ON

## 2011-03-15 LAB — PRO B NATRIURETIC PEPTIDE: Pro B Natriuretic peptide (BNP): 1621 pg/mL — ABNORMAL HIGH (ref 0–125)

## 2011-03-15 MED ORDER — METHYLPREDNISOLONE SODIUM SUCC 40 MG IJ SOLR: 20.0000 mg | INTRAMUSCULAR | Status: DC

## 2011-03-15 MED ORDER — LEVOFLOXACIN IN D5W 750 MG/150ML IV SOLN
750.0000 mg | INTRAVENOUS | Status: DC
  Administered 2011-03-15 – 2011-03-21 (×6): 750 mg via INTRAVENOUS
  Filled 2011-03-15 (×8): qty 150

## 2011-03-15 MED ORDER — SODIUM CHLORIDE 0.9 % IJ SOLN
INTRAMUSCULAR | Status: AC
  Filled 2011-03-15: qty 3

## 2011-03-15 MED ORDER — PREDNISONE 20 MG PO TABS
20.0000 mg | ORAL_TABLET | Freq: Every day | ORAL | Status: DC
  Administered 2011-03-17: 20 mg via ORAL
  Filled 2011-03-15: qty 1

## 2011-03-15 MED ORDER — VANCOMYCIN HCL IN DEXTROSE 1-5 GM/200ML-% IV SOLN
1000.0000 mg | Freq: Two times a day (BID) | INTRAVENOUS | Status: DC
  Administered 2011-03-15 – 2011-03-16 (×3): 1000 mg via INTRAVENOUS
  Filled 2011-03-15 (×4): qty 200

## 2011-03-15 MED ORDER — POTASSIUM CHLORIDE 10 MEQ/100ML IV SOLN: 10.0000 meq | INTRAVENOUS | Status: DC

## 2011-03-15 MED ORDER — DEXTROSE 5 % IV SOLN
INTRAVENOUS | Status: AC
  Filled 2011-03-15 (×2): qty 1

## 2011-03-15 MED ORDER — POTASSIUM CHLORIDE 20 MEQ/15ML (10%) PO LIQD
40.0000 meq | Freq: Three times a day (TID) | ORAL | Status: AC
  Administered 2011-03-15 (×3): 40 meq via ORAL

## 2011-03-15 MED ORDER — VANCOMYCIN HCL IN DEXTROSE 1-5 GM/200ML-% IV SOLN
INTRAVENOUS | Status: AC
  Filled 2011-03-15: qty 200

## 2011-03-15 MED ORDER — POTASSIUM CHLORIDE 20 MEQ/15ML (10%) PO SOLN
ORAL | Status: AC
  Administered 2011-03-15: 40 meq via ORAL
  Filled 2011-03-15: qty 90

## 2011-03-15 MED ORDER — POTASSIUM CHLORIDE CRYS ER 20 MEQ PO TBCR: 40.0000 meq | EXTENDED_RELEASE_TABLET | Freq: Three times a day (TID) | ORAL | Status: DC

## 2011-03-15 MED ORDER — INSULIN GLARGINE 100 UNIT/ML ~~LOC~~ SOLN: 5.0000 [IU] | Freq: Two times a day (BID) | SUBCUTANEOUS | Status: DC

## 2011-03-15 MED ORDER — LEVOFLOXACIN IN D5W 750 MG/150ML IV SOLN
INTRAVENOUS | Status: AC
  Filled 2011-03-15: qty 150

## 2011-03-15 MED ORDER — DEXTROSE 5 % IV SOLN
1.0000 g | Freq: Three times a day (TID) | INTRAVENOUS | Status: DC
  Administered 2011-03-15 – 2011-03-19 (×11): 1 g via INTRAVENOUS
  Filled 2011-03-15 (×12): qty 1

## 2011-03-15 NOTE — Progress Notes (Addendum)
Subjective: Patient continues to show signs of improvement. Since his extubation 2 days ago, he is becoming more and more cognizant. This morning he responded to my voice. By this afternoon, nursing notes that he is able to answer some one word questions. He tolerated replacement of his NG tube and can take medicines through it. Attempts at giving him to peds to his NG tube led to nausea and vomiting.  Objective: Vital signs in last 24 hours: Filed Vitals:   03/15/11 1300 03/15/11 1400 03/15/11 1431 03/15/11 1500  BP: 110/62 125/58  128/54  Pulse: 79 80  82  Temp:      TempSrc:      Resp: 30 27  34  Height:      Weight:      SpO2: 96% 92% 93% 95%    Intake/Output Summary (Last 24 hours) at 03/15/11 1628 Last data filed at 03/15/11 1400  Gross per 24 hour  Intake   1444 ml  Output   2150 ml  Net   -706 ml    Weight change:   Physical exam: General: Patient does not look to be in any acute distress. Earlier in the day he was not able to answer any questions for me, but did turn his head to me saying his name. This is new and improved from previous 24 hours. Looks about stated age HEENT: Normocephalic, atraumatic, mucous membranes are slightly dry Heart: S1, S2. No murmurs rubs or gallops. Lungs: A few rales, but really more clear than it has been previously. Abdomen: Positive bowel sounds, soft, nontender?, nondistended. Extremities: Trace pitting edema. No clubbing or cyanosis  Lab Results: Basic Metabolic Panel:  Basename 03/15/11 0451 03/14/11 0459  NA 131* 130*  K 2.5* 2.7*  CL 89* 93*  CO2 27 30  GLUCOSE 83 83  BUN 21 23  CREATININE 0.83 0.86  CALCIUM 9.3 9.0  MG -- --  PHOS -- --   CBC:  Basename 03/15/11 0451 03/14/11 0459  WBC 14.2* 13.1*  NEUTROABS -- --  HGB 10.0* 8.8*  HCT 28.9* 26.8*  MCV 91.5 95.4  PLT 643* 559*   CBG:  Basename 03/15/11 1153 03/15/11 0728 03/15/11 0404 03/14/11 2331 03/14/11 1930 03/14/11 1646  GLUCAP 139* 85 79 79 87 90     Medications: I have reviewed the patient's current medications.  Assessment: Principal Problem:  *Respiratory failure Active Problems:  CAP (community acquired pneumonia)  Toxic encephalopathy  Dehydration  Rhabdomyolysis  Benign hypertension  Hyperlipidemia  GERD (gastroesophageal reflux disease)  Chronic pain  Hypokalemia  Anemia  Elevated troponin I level  Hypernatremia  Hyperglycemia  Elevated LFTs  Non-ST elevation MI (NSTEMI)  Pulmonary edema  1. Acute ventilator dependent respiratory failure secondary to community-acquired pneumonia. Now status post extubation. He has continued to be able to protect his airway. Now that he is able to take by mouth medicines through his NG tube, has stopped the Solu-Medrol and put him on prednisone taper. Pulmonary is following. Noted increasing white blood cell count and low-grade fevers. Repeat chest x-ray (and urinalysis) have been ordered for today.  Toxic metabolic encephalopathy. CT scan unremarkable. EEG results as per neurology are unimpressive. His cognition continues to improve significantly in a short period of time. Already today he is responding to his name and answering a few one-word questions. Initially I was concerned about the possibility of him eating and looking to him getting a PEG tube, but we'll put this on hold for now. Speech therapy will  see the patient, hopefully tomorrow, to followup. He did not tolerate tube feeds through his NG tube and I'm hesitant to we feed him until speech therapy has seen him.  Leukocytosis: See above. Ordered repeat chest x-ray and urinalysis. He's been off of antibiotics for a few days.  Probable superimposed pulmonary edema, given his elevated BNP. BNP has progressively decreased with gentle Lasix daily. We'll watch his urine output and renal function. Followup BNP ordered for tomorrow.  Elevated cardiac enzymes consistent with a non-ST elevation myocardial infarction. Aspirin and  metoprolol were started. He remains on prophylactic Lovenox. His EKG on 03/06/2011 revealed normal sinus rhythm with a heart rate of 94 beats per minute, left axis deviation, and inferior/anterior lateral ST and T wave abnormalities. His cardiac panel has not been checked in one week. We'll repeat labs for the morning.  Hypertension. His blood pressure is better on metoprolol.  He is on Lotensin and hydrochlorothiazide chronically at home. Currently pressures are good.  Hypernatremia/dehydration. This resolved with IV fluids through his NG tube while he was intubated. His sodium has been stable for some time. Recheck now that he is n.p.o. with lab work in the morning.  Elevated liver transaminases. Resolved. This may have been secondary to rhabdomyolysis.   Hypokalemia. Potassium has been low for several days now requiring IV and/or by mouth replacement. Recheck a magnesium level as well as follow-up BMET in the morning.  Steroid-induced hyperglycemia/secondary to tube feedings. His blood glucose was trending downward with the decrease in Solu-Medrol. Discontinued Lantus. Since he is n.p.o., will treat with every 4 hours sliding scale only. With an A1c of 5.8 technically he is a diabetic, although he would not need to be on medications at home for this.  Anemia. Initially his hemoglobin was 12.7, but there was some drop likely from hemoconcentration followed by volume replacement. His hemoglobin has been stable. No signs of any bleeding.  Chronic pain syndrome. He is on transdermal Duragesic chronically at home.  Addendum: Chest x-ray noted developing infiltrates. Have started him on Levaquin plus cefepime plus vancomycin for hcap. Also noted patient's her having oxygen desaturation requiring increased nonrebreather. Checking BNP stat.  LOS: 12 days   Marcus [name] K 03/15/2011, 4:28 PM

## 2011-03-15 NOTE — Progress Notes (Signed)
DAUGHTER IN TO SEE PT. DR S. KRISHNAN TALKED W/ DAUGHTER ON TELEPHONE BEFORE SHE LEFT. AFTER SHE HAD LEFT, PT'S O2 SAT DROPPED TO 70'S. O2 CHANGED TO VENTIMASK AT 50%. O2 SAT UP TO 80'S. THEN O2 VIA NONREBREATHER APPLIED AT 100%. O2 SATS ARE NOW IN THE UPPER 90'S. CXR SHOWS NG TUGE IS IN STOMACH. ATTEMPTED TO PREFORMED DEEP NASAL SUCTION, BUT PT PUT UP TOO MUCH RESISTANCE. PT IS NOW RESTING QUIETLY. O2 SAT 100% ON NRB.

## 2011-03-15 NOTE — Progress Notes (Signed)
Subjective: I think he is overall about the same. This is day 2 of being extubated. His respiratory rate is up a little bit but I think that's because he is been moving around. It is still difficult to assess his neurological status because he doesn't seem to understand commands very well.  Objective: Vital signs in last 24 hours: Temp:  [98 F (36.7 C)-100.4 F (38 C)] 98.3 F (36.8 C) (01/06 0400) Pulse Rate:  [73-98] 88  (01/06 0812) Resp:  [21-37] 28  (01/06 0812) BP: (109-167)/(49-80) 109/49 mmHg (01/06 0700) SpO2:  [90 %-100 %] 93 % (01/06 0812) FiO2 (%):  [40 %-50 %] 50 % (01/06 0711) Weight:  [63.6 kg (140 lb 3.4 oz)] 140 lb 3.4 oz (63.6 kg) (01/06 0500) Weight change:  Last BM Date: 03/15/11  Intake/Output from previous day: 01/05 0701 - 01/06 0700 In: 2012 [P.O.:90; I.V.:970; IV Piggyback:952] Out: 2500 [Urine:2400; Emesis/NG output:100]  PHYSICAL EXAM General appearance: alert and Confused Resp: rhonchi bilaterally Cardio: regular rate and rhythm, S1, S2 normal, no murmur, click, rub or gallop GI: soft, non-tender; bowel sounds normal; no masses,  no organomegaly Extremities: extremities normal, atraumatic, no cyanosis or edema  Lab Results:    Basic Metabolic Panel:  Basename 03/15/11 0451 03/14/11 0459  NA 131* 130*  K 2.5* 2.7*  CL 89* 93*  CO2 27 30  GLUCOSE 83 83  BUN 21 23  CREATININE 0.83 0.86  CALCIUM 9.3 9.0  MG -- --  PHOS -- --   Liver Function Tests: No results found for this basename: AST:2,ALT:2,ALKPHOS:2,BILITOT:2,PROT:2,ALBUMIN:2 in the last 72 hours No results found for this basename: LIPASE:2,AMYLASE:2 in the last 72 hours No results found for this basename: AMMONIA:2 in the last 72 hours CBC:  Basename 03/15/11 0451 03/14/11 0459  WBC 14.2* 13.1*  NEUTROABS -- --  HGB 10.0* 8.8*  HCT 28.9* 26.8*  MCV 91.5 95.4  PLT 643* 559*   Cardiac Enzymes: No results found for this basename: CKTOTAL:3,CKMB:3,CKMBINDEX:3,TROPONINI:3 in  the last 72 hours BNP:  [facility] 03/13/11 0449  PROBNP 958.8*   D-Dimer: No results found for this basename: DDIMER:2 in the last 72 hours CBG:  Basename 03/15/11 0728 03/15/11 0404 03/14/11 2331 03/14/11 1930 03/14/11 1646 03/14/11 1157  GLUCAP 85 79 79 87 90 79   Hemoglobin A1C: No results found for this basename: HGBA1C in the last 72 hours Fasting Lipid Panel: No results found for this basename: CHOL,HDL,LDLCALC,TRIG,CHOLHDL,LDLDIRECT in the last 72 hours Thyroid Function Tests: No results found for this basename: TSH,T4TOTAL,FREET4,T3FREE,THYROIDAB in the last 72 hours Anemia Panel: No results found for this basename: VITAMINB12,FOLATE,FERRITIN,TIBC,IRON,RETICCTPCT in the last 72 hours Coagulation: No results found for this basename: LABPROT:2,INR:2 in the last 72 hours Urine Drug Screen: Drugs of Abuse  No results found for this basename: labopia, cocainscrnur, labbenz, amphetmu, thcu, labbarb    Alcohol Level: No results found for this basename: ETH:2 in the last 72 hours Urinalysis:  Misc. Labs:  ABGS  Basename 03/13/11 0600  PHART 7.446  PO2ART 100.0  TCO2 28.7  HCO3 31.0*   CULTURES Recent Results (from the past 240 hour(s))  CULTURE, BLOOD (ROUTINE X 2)     Status: Normal   Collection Time   03/06/11  8:38 AM      Component Value Range Status Comment   Specimen Description BLOOD SITE NOT SPECIFIED DRAWN BY RN   Final    Special Requests BOTTLES DRAWN AEROBIC AND ANAEROBIC 6CC   Final    Culture NO GROWTH  5 DAYS   Final    Report Status 03/11/2011 FINAL   Final   CULTURE, BLOOD (ROUTINE X 2)     Status: Normal   Collection Time   03/06/11  8:56 AM      Component Value Range Status Comment   Specimen Description BLOOD LEFT ARM   Final    Special Requests BOTTLES DRAWN AEROBIC AND ANAEROBIC 6CC   Final    Culture NO GROWTH 5 DAYS   Final    Report Status 03/11/2011 FINAL   Final    Studies/Results: No results found.  Medications:  Scheduled:    . albuterol  2.5 mg Nebulization Q6H  . antiseptic oral rinse  1 application Mouth Rinse QID  . aspirin  81 mg Oral Daily  . azithromycin  500 mg Intravenous Q24H  . chlorhexidine  15 mL Mouth/Throat BID  . enoxaparin  40 mg Subcutaneous Q24H  . furosemide  20 mg Intravenous Q12H  . insulin aspart  0-9 Units Subcutaneous Q4H  . insulin glargine  10 Units Subcutaneous BID  . ipratropium  0.5 mg Nebulization Q6H  . methylPREDNISolone (SOLU-MEDROL) injection  30 mg Intravenous Q24H  . metoprolol tartrate  12.5 mg Oral BID  . pantoprazole sodium  40 mg Per Tube Q1200  . piperacillin-tazobactam (ZOSYN)  IV  3.375 g Intravenous Q8H  . potassium chloride  10 mEq Intravenous Q1 Hr x 4  . potassium chloride  10 mEq Intravenous Q1 Hr x 6  . potassium chloride  20 mEq Oral BID  . sodium chloride  10 mL Intracatheter Q12H  . vancomycin  1,000 mg Intravenous Q12H   Continuous:   . dextrose 5 % with KCl 20 mEq / [name] 20 mEq (03/15/11 0800)  . feeding supplement (JEVITY 1.2) 1,000 mL (03/12/11 1618)  . fentaNYL infusion INTRAVENOUS Stopped (03/13/11 0700)  . midazolam (VERSED) infusion Stopped (03/13/11 0700)   ZOX:[id number], ondansetron (ZOFRAN) IV, ondansetron, sodium chloride  Assesment: He has had respiratory failure. He is now day 2 off the ventilator. He is I think better but does have some tachypnea. His neurological status is uncertain. He is hypokalemic. Principal Problem:  *Respiratory failure Active Problems:  CAP (community acquired pneumonia)  Toxic encephalopathy  Dehydration  Rhabdomyolysis  Benign hypertension  Hyperlipidemia  GERD (gastroesophageal reflux disease)  Chronic pain  Hypokalemia  Anemia  Elevated troponin I level  Hypernatremia  Hyperglycemia  Elevated LFTs  Non-ST elevation MI (NSTEMI)  Pulmonary edema    Plan: I will blood gas from the morning continue his other treatments and I took the liberty of going ahead and ordering potassium runs  because Dr. Rito Ehrlich on the hospitalist attending was busy with a family conference    LOS: 12 days   Marcus [name] 03/15/2011, 10:06 AM

## 2011-03-15 NOTE — Progress Notes (Signed)
PT IS MUCH MORE ALERT. ANSWERING SOME QUESTIONS W SINGLE/ SYLLABLE RESPONSE. FOLLOWING SOME COMMANDS. GOOD EYE CONTACT.

## 2011-03-16 ENCOUNTER — Inpatient Hospital Stay (HOSPITAL_COMMUNITY): Payer: Medicare Other

## 2011-03-16 DIAGNOSIS — E871 Hypo-osmolality and hyponatremia: Secondary | ICD-10-CM | POA: Diagnosis not present

## 2011-03-16 DIAGNOSIS — D473 Essential (hemorrhagic) thrombocythemia: Secondary | ICD-10-CM | POA: Diagnosis present

## 2011-03-16 LAB — BASIC METABOLIC PANEL
BUN: 22 mg/dL (ref 6–23)
Calcium: 9.3 mg/dL (ref 8.4–10.5)
Chloride: 92 mEq/L — ABNORMAL LOW (ref 96–112)
GFR calc Af Amer: >90 mL/min (ref >90)
GFR calc Af Amer: >90 mL/min (ref >90)
GFR calc non Af Amer: 86 mL/min — ABNORMAL LOW (ref >90)
Glucose, Bld: 126 mg/dL — ABNORMAL HIGH (ref 70–99)
Potassium: 2.5 mEq/L — CL (ref 3.5–5.1)
Potassium: 3 mEq/L — ABNORMAL LOW (ref 3.5–5.1)
Sodium: 131 mEq/L — ABNORMAL LOW (ref 135–145)
Sodium: 127 mEq/L — ABNORMAL LOW (ref 135–145)

## 2011-03-16 LAB — GLUCOSE, CAPILLARY: Glucose-Capillary: 122 mg/dL — ABNORMAL HIGH (ref 70–99)

## 2011-03-16 LAB — CBC
MCV: 92.4 fL (ref 78.0–100.0)
Platelets: 688 10*3/uL — ABNORMAL HIGH (ref 150–400)
RBC: 3.03 MIL/uL — ABNORMAL LOW (ref 4.22–5.81)
RDW: 13.3 % (ref 11.5–15.5)
WBC: 15.5 10*3/uL — ABNORMAL HIGH (ref 4.0–10.5)

## 2011-03-16 LAB — HEPATIC FUNCTION PANEL
Albumin: 2.4 g/dL — ABNORMAL LOW (ref 3.5–5.2)
Alkaline Phosphatase: 53 U/L (ref 39–117)
Indirect Bilirubin: 0.3 mg/dL (ref 0.3–0.9)
Total Protein: 6.6 g/dL (ref 6.0–8.3)

## 2011-03-16 LAB — CARDIAC PANEL(CRET KIN+CKTOT+MB+TROPI)
CK, MB: 3 ng/mL (ref 0.3–4.0)
Relative Index: INVALID (ref 0.0–2.5)
Total CK: 88 U/L (ref 7–232)
Troponin I: <0.3 ng/mL (ref <0.30)

## 2011-03-16 LAB — BLOOD GAS, ARTERIAL
Acid-Base Excess: 0.9 mmol/L (ref 0.0–2.0)
pCO2 arterial: 26.3 mmHg — ABNORMAL LOW (ref 35.0–45.0)
pH, Arterial: 7.554 — ABNORMAL HIGH (ref 7.350–7.450)

## 2011-03-16 LAB — LIPASE, BLOOD: Lipase: 174 U/L — ABNORMAL HIGH (ref 11–59)

## 2011-03-16 MED ORDER — PROPOFOL 10 MG/ML IV EMUL
INTRAVENOUS | Status: AC
  Filled 2011-03-16: qty 100

## 2011-03-16 MED ORDER — METOPROLOL TARTRATE 1 MG/ML IV SOLN
5.0000 mg | Freq: Four times a day (QID) | INTRAVENOUS | Status: DC | PRN
  Administered 2011-03-17: 5 mg via INTRAVENOUS
  Filled 2011-03-16 (×3): qty 5

## 2011-03-16 MED ORDER — LEVOFLOXACIN IN D5W 750 MG/150ML IV SOLN
INTRAVENOUS | Status: AC
  Filled 2011-03-16: qty 150

## 2011-03-16 MED ORDER — MILK AND MOLASSES ENEMA
Freq: Once | RECTAL | Status: AC
  Administered 2011-03-16: 19:00:00 via RECTAL
  Filled 2011-03-16: qty 250

## 2011-03-16 MED ORDER — POTASSIUM CHLORIDE 10 MEQ/100ML IV SOLN
10.0000 meq | INTRAVENOUS | Status: AC
  Administered 2011-03-16 (×4): 10 meq via INTRAVENOUS
  Filled 2011-03-16: qty 400

## 2011-03-16 MED ORDER — POTASSIUM CHLORIDE 10 MEQ/100ML IV SOLN
INTRAVENOUS | Status: AC
  Administered 2011-03-16: 10 meq via INTRAVENOUS
  Filled 2011-03-16: qty 100

## 2011-03-16 MED ORDER — KCL IN DEXTROSE-NACL 40-5-0.9 MEQ/L-%-% IV SOLN
INTRAVENOUS | Status: DC
  Administered 2011-03-16 – 2011-03-17 (×3): via INTRAVENOUS
  Administered 2011-03-18: 70 mL via INTRAVENOUS
  Filled 2011-03-16 (×8): qty 1000

## 2011-03-16 MED ORDER — PANTOPRAZOLE SODIUM 40 MG IV SOLR
40.0000 mg | INTRAVENOUS | Status: DC
  Administered 2011-03-16 – 2011-03-27 (×12): 40 mg via INTRAVENOUS
  Filled 2011-03-16 (×13): qty 40

## 2011-03-16 MED ORDER — EPINEPHRINE HCL 1 MG/ML IJ SOLN
INTRAMUSCULAR | Status: AC
  Filled 2011-03-16: qty 1

## 2011-03-16 NOTE — Consult Note (Signed)
ANTIBIOTIC CONSULT NOTE   Pharmacy Consult for Vancomycin, Cefepime, Levaquin Indication: pneumonia  No Known Allergies  Patient Measurements: Height: 5\' 4"  (162.6 cm) Weight: 140 lb 10.5 oz (63.8 kg) IBW/kg (Calculated) : 59.2   Vital Signs: Temp: 99.5 F (37.5 C) (01/07 0400) Temp src: Axillary (01/07 0400) BP: 140/63 mmHg (01/07 0600) Pulse Rate: 84  (01/07 0600) Intake/Output from previous day: 01/06 0701 - 01/07 0700 In: 1282 [I.V.:900; NG/GT:380; IV Piggyback:2] Out: 2950 [Urine:2650; Emesis/NG output:300] Intake/Output from this shift:    Labs:  Basename 03/16/11 0425 03/15/11 1518 03/15/11 0451 03/14/11 0459  WBC 15.5* -- 14.2* 13.1*  HGB 9.6* -- 10.0* 8.8*  PLT 688* -- 643* 559*  LABCREA -- -- -- --  CREATININE 0.86 0.83 0.83 --   Estimated Creatinine Clearance: 65 ml/min (by C-G formula based on Cr of 0.86).  Basename 03/13/11 1032  VANCOTROUGH 20.1*  VANCOPEAK --  VANCORANDOM --  GENTTROUGH --  GENTPEAK --  GENTRANDOM --  TOBRATROUGH --  TOBRAPEAK --  TOBRARND --  AMIKACINPEAK --  AMIKACINTROU --  AMIKACIN --    Microbiology: Recent Results (from the past 720 hour(s))  CULTURE, BLOOD (ROUTINE X 2)     Status: Normal   Collection Time   03/03/11  3:05 PM      Component Value Range Status Comment   Specimen Description BLOOD RIGHT ANTECUBITAL   Final    Special Requests     Final    Value: BOTTLES DRAWN AEROBIC AND ANAEROBIC 6CC EACH BOTTLE   Culture NO GROWTH 5 DAYS   Final    Report Status 03/08/2011 FINAL   Final   CULTURE, BLOOD (ROUTINE X 2)     Status: Normal   Collection Time   03/03/11  4:31 PM      Component Value Range Status Comment   Specimen Description BLOOD RIGHT HAND   Final    Special Requests BOTTLES DRAWN AEROBIC ONLY 6CC BOTTLE   Final    Culture NO GROWTH 5 DAYS   Final    Report Status 03/08/2011 FINAL   Final   MRSA PCR SCREENING     Status: Normal   Collection Time   03/03/11  6:35 PM      Component Value Range  Status Comment   MRSA by PCR NEGATIVE  NEGATIVE  Final   CULTURE, BLOOD (ROUTINE X 2)     Status: Normal   Collection Time   03/06/11  8:38 AM      Component Value Range Status Comment   Specimen Description BLOOD SITE NOT SPECIFIED DRAWN BY RN   Final    Special Requests BOTTLES DRAWN AEROBIC AND ANAEROBIC 6CC   Final    Culture NO GROWTH 5 DAYS   Final    Report Status 03/11/2011 FINAL   Final   CULTURE, BLOOD (ROUTINE X 2)     Status: Normal   Collection Time   03/06/11  8:56 AM      Component Value Range Status Comment   Specimen Description BLOOD LEFT ARM   Final    Special Requests BOTTLES DRAWN AEROBIC AND ANAEROBIC 6CC   Final    Culture NO GROWTH 5 DAYS   Final    Report Status 03/11/2011 FINAL   Final    Medical History: Past Medical History  Diagnosis Date  . Hypertension   . GERD (gastroesophageal reflux disease)   . Hypercholesteremia   . Stroke   . Anemia 03/06/2011  . Smoker   .  Non-ST elevation MI (NSTEMI) 03/07/2011    With PNA and resp failure   Medications:  Scheduled:     . albuterol  2.5 mg Nebulization Q6H  . antiseptic oral rinse  1 application Mouth Rinse QID  . aspirin  81 mg Oral Daily  . ceFEPime (MAXIPIME) IV  1 g Intravenous Q8H  . chlorhexidine  15 mL Mouth/Throat BID  . enoxaparin  40 mg Subcutaneous Q24H  . furosemide  20 mg Intravenous Q12H  . insulin aspart  0-9 Units Subcutaneous Q4H  . ipratropium  0.5 mg Nebulization Q6H  . levofloxacin (LEVAQUIN) IV  750 mg Intravenous Q24H  . metoprolol tartrate  12.5 mg Oral BID  . pantoprazole sodium  40 mg Per Tube Q1200  . potassium chloride  40 mEq Oral TID  . predniSONE  20 mg Oral Q breakfast  . sodium chloride  10 mL Intracatheter Q12H  . sodium chloride      . vancomycin  1,000 mg Intravenous Q12H  . DISCONTD: azithromycin  500 mg Intravenous Q24H  . DISCONTD: insulin glargine  10 Units Subcutaneous BID  . DISCONTD: insulin glargine  5 Units Subcutaneous BID  . DISCONTD:  methylPREDNISolone (SOLU-MEDROL) injection  20 mg Intravenous Q24H  . DISCONTD: methylPREDNISolone (SOLU-MEDROL) injection  30 mg Intravenous Q24H  . DISCONTD: piperacillin-tazobactam (ZOSYN)  IV  3.375 g Intravenous Q8H  . DISCONTD: potassium chloride  10 mEq Intravenous Q1 Hr x 6  . DISCONTD: potassium chloride  20 mEq Oral BID  . DISCONTD: potassium chloride  40 mEq Oral TID  . DISCONTD: vancomycin  1,000 mg Intravenous Q12H   Assessment: Trough on target on 1/4 (right at 20) Renal fxn OK (clcr > 60) Levaquin and Cefepime added  Goal of Therapy:  Vancomycin trough level 15-20 mcg/ml  Plan: Continue vancomycin 1gm iv q12hrs Re-check trough tomorrow to see if accumulating levaquin 750mg  iv q24hrs Cefepime 1gm iv q8hrs Labs per protocol  Marcus [name] A 03/16/2011,8:04 AM

## 2011-03-16 NOTE — Progress Notes (Signed)
We have received a consult for PT eval, but RN feels that pt is not yet ready for mobilization due to resp. Status.  Will check on him tomorrow.

## 2011-03-16 NOTE — Progress Notes (Signed)
Speech Language/Pathology Clinical/Bedside Swallow Evaluation Patient Details  Name: Perkins Molina MRN: [id number] DOB: 05-Sep-1940 Today's Date: 03/16/2011  Past Medical History:  Past Medical History  Diagnosis Date  . Hypertension   . GERD (gastroesophageal reflux disease)   . Hypercholesteremia   . Stroke   . Anemia 03/06/2011  . Smoker   . Non-ST elevation MI (NSTEMI) 03/07/2011    With PNA and resp failure   Past Surgical History: History reviewed. No pertinent past surgical history.  HPI:  Mr. Gathright is a 71 year old man who was admitted a couple of weeks ago from home with AMS. He was extubated on Friday and has slowly become more alert. He does not make eye contact (? blind) and startles easily to touch. He has NG tube placed for suctioning due to emesis. He voiced only occsionally with me and it sounded like a groan and "yeah".    Assessment/Recommendations/Treatment Plan   SLP Assessment Clinical Impression Statement: Dysphagia in setting of lethergy and decreased mental status. SLP will re-assess Mr. Ferrante tomorrow for po readiness skills. MBSS is recommended given suspected aspiration pneumonia, however pt will need to be alert enough to participate. Risk for Aspiration: Moderate Other Related Risk Factors: History of pneumonia;Lethargy;Cognitive impairment  Swallow Recommendations General Recommendation: NPO;Alternative means - temporary  Treatment Plan Treatment Plan Recommendations: F/U MBS in ___ days (Comment) (SLP will reassess tomorrow to see if ready for MBSS) Speech Therapy Frequency: min 2x/week Treatment Duration: 1 week Interventions: Aspiration precaution training;Patient/family education;Diet toleration management by SLP;Trials of upgraded texture/liquids  Prognosis Prognosis for Safe Diet Advancement: Fair Barriers to Reach Goals: Cognitive deficits;Behavior  Individuals Consulted Consulted and Agree with Results and Recommendations: Patient  unable/family or caregiver not available;RN  Swallowing Goals   Pending MBSS  Swallow Study Prior Functional Status  Cognitive/Linguistic Baseline: Information not available Lives With: Daughter  General  Date of Onset: 03/03/11 HPI: Mr. Witucki is a 71 year old man who was admitted a couple of weeks ago from home with AMS. He was extubated on Friday and has slowly become more alert. He does not make eye contact (? blind) and startles easily to touch. He has NG tube placed for suctioning due to emesis. He voiced only occsionally with me and it sounded like a groan and "yeah".  Type of Study: Bedside swallow evaluation Diet Prior to this Study: NPO Temperature Spikes Noted: Yes Respiratory Status: Supplemental O2 delivered via (comment) History of Intubation: Yes Date extubated: 03/13/11 Behavior/Cognition: Requires cueing (variably responsive) Oral Cavity - Dentition: Poor condition Vision: Impaired for self-feeding Patient Positioning: Upright in bed Baseline Vocal Quality: Low vocal intensity Volitional Cough: Cognitively unable to elicit Volitional Swallow: Unable to elicit Ice chips: Tested (comment)  Oral Motor/Sensory Function  Overall Oral Motor/Sensory Function:  (unable to assess due to mentation, tongue thrust with puree )  Consistency Results  Ice Chips Ice chips: Impaired Presentation: Spoon Oral Phase Impairments: Reduced labial seal Pharyngeal Phase Impairments: Delayed Swallow Other Comments: facial grimmace with swallow and pt responded "yeah" when asked if in pain  Thin Liquid Thin Liquid: Impaired (Pt unable to close lips around spoon, straw, cup) Oral Phase Impairments: Reduced labial seal;Poor awareness of bolus  Nectar Thick Liquid Nectar Thick Liquid: Not tested  Honey Thick Liquid Honey Thick Liquid: Not tested  Puree Puree: Impaired Presentation: Spoon Oral Phase Impairments: Reduced labial seal;Reduced lingual movement/coordination;Impaired  anterior to posterior transit Oral Phase Functional Implications: Oral holding;Oral residue Pharyngeal Phase Impairments: Delayed Swallow;Unable to trigger  swallow Other Comments: Pt with tongue thrust with spoon to mouth and only able to initiate swallow when given an ice chip after the puree presentation.  Solid Solid: Not tested  Havery Moros, CCC-SLP [phone]  Toye Rouillard 03/16/2011,4:14 PM

## 2011-03-16 NOTE — Progress Notes (Signed)
Nutrition Follow-up  Diet Order: NPO currently. NGT placed due to vomiting episode. SLP eval today. MBSS pending when pt is more alert.   Nutritional needs re-assessed:1600-1920 kcal, 83-96 grams protein, 1.6-1.9 L/d f;uid.  Meds: Scheduled Meds:   . albuterol  2.5 mg Nebulization Q6H  . antiseptic oral rinse  1 application Mouth Rinse QID  . aspirin  81 mg Oral Daily  . ceFEPime (MAXIPIME) IV  1 g Intravenous Q8H  . chlorhexidine  15 mL Mouth/Throat BID  . enoxaparin  40 mg Subcutaneous Q24H  . furosemide  20 mg Intravenous Q12H  . insulin aspart  0-9 Units Subcutaneous Q4H  . ipratropium  0.5 mg Nebulization Q6H  . levofloxacin (LEVAQUIN) IV  750 mg Intravenous Q24H  . metoprolol tartrate  12.5 mg Oral BID  . pantoprazole (PROTONIX) IV  40 mg Intravenous Q24H  . potassium chloride  10 mEq Intravenous Q1 Hr x 4  . potassium chloride  40 mEq Oral TID  . predniSONE  20 mg Oral Q breakfast  . propofol      . sodium chloride  10 mL Intracatheter Q12H  . vancomycin  1,000 mg Intravenous Q12H  . DISCONTD: insulin glargine  5 Units Subcutaneous BID  . DISCONTD: methylPREDNISolone (SOLU-MEDROL) injection  20 mg Intravenous Q24H  . DISCONTD: pantoprazole sodium  40 mg Per Tube Q1200   Continuous Infusions:   . dextrose 5 % and 0.9 % NaCl with KCl 40 mEq/L 70 mL/hr at 03/16/11 1400  . fentaNYL infusion INTRAVENOUS Stopped (03/13/11 0700)  . midazolam (VERSED) infusion Stopped (03/13/11 0700)  . DISCONTD: dextrose 5 % with KCl 20 mEq / L 20 mEq (03/16/11 0900)   PRN Meds:.acetaminophen, metoprolol, ondansetron (ZOFRAN) IV, ondansetron, sodium chloride  Labs:  CMP     Component Value Date/Time   NA 127* 03/16/2011 0425   K 3.0* 03/16/2011 0425   CL 92* 03/16/2011 0425   CO2 26 03/16/2011 0425   GLUCOSE 126* 03/16/2011 0425   BUN 22 03/16/2011 0425   CREATININE 0.86 03/16/2011 0425   CALCIUM 9.2 03/16/2011 0425   PROT 6.6 03/16/2011 0921   ALBUMIN 2.4* 03/16/2011 0921   AST 17 03/16/2011 0921   ALT 18 03/16/2011 0921   ALKPHOS 53 03/16/2011 0921   BILITOT 0.4 03/16/2011 0921   GFRNONAA 85* 03/16/2011 0425   GFRAA >90 03/16/2011 0425     Intake/Output Summary (Last 24 hours) at 03/16/11 1633 Last data filed at 03/16/11 1400  Gross per 24 hour  Intake   1892 ml  Output   1450 ml  Net    442 ml    Weight Status: 140.10# (63.8 kg) stable within 1# of admission wt   Nutrition Dx:  Inadequate oral intake cont r/t decr mental status, swallow difficulty AEB Reported cognitive impairment, Dysphagia per SLP eval  Goal: Pt will successfully advance diet and oral intake to meet >75% of est nutritional needs  Intervention: Rec pending results of MBSS and/or diet advancement  Monitor: Plan of care,    Francene Boyers Pager [phone]

## 2011-03-16 NOTE — Progress Notes (Signed)
Subjective: He has had problems with oxygenation. I expect he may be aspirating considering his overall mental status. Does seem to be getting more alert.  Objective: Vital signs in last 24 hours: Temp:  [98.2 F (36.8 C)-100.2 F (37.9 C)] 99.5 F (37.5 C) (01/07 0400) Pulse Rate:  [79-95] 84  (01/07 0600) Resp:  [21-35] 31  (01/07 0600) BP: (107-159)/(46-78) 140/63 mmHg (01/07 0600) SpO2:  [88 %-100 %] 100 % (01/07 0729) FiO2 (%):  [60 %-100 %] 60 % (01/07 0729) Weight:  [63.8 kg (140 lb 10.5 oz)] 140 lb 10.5 oz (63.8 kg) (01/07 0500) Weight change: 0.2 kg (7.1 oz) Last BM Date: 03/15/11  Intake/Output from previous day: 01/06 0701 - 01/07 0700 In: 1282 [I.V.:900; NG/GT:380; IV Piggyback:2] Out: 2950 [Urine:2650; Emesis/NG output:300]  PHYSICAL EXAM General appearance: alert and mild distress Resp: rhonchi bilaterally Cardio: regular rate and rhythm, S1, S2 normal, no murmur, click, rub or gallop GI: soft, non-tender; bowel sounds normal; no masses,  no organomegaly Extremities: extremities normal, atraumatic, no cyanosis or edema  Lab Results:    Basic Metabolic Panel:  Basename 03/16/11 0425 03/15/11 1518  NA 127* 127*  K 3.0* 3.3*  CL 92* 90*  CO2 26 27  GLUCOSE 126* 121*  BUN 22 23  CREATININE 0.86 0.83  CALCIUM 9.2 9.3  MG 1.9 --  PHOS -- --   Liver Function Tests: No results found for this basename: AST:2,ALT:2,ALKPHOS:2,BILITOT:2,PROT:2,ALBUMIN:2 in the last 72 hours No results found for this basename: LIPASE:2,AMYLASE:2 in the last 72 hours No results found for this basename: AMMONIA:2 in the last 72 hours CBC:  Basename 03/16/11 0425 03/15/11 0451  WBC 15.5* 14.2*  NEUTROABS -- --  HGB 9.6* 10.0*  HCT 28.0* 28.9*  MCV 92.4 91.5  PLT 688* 643*   Cardiac Enzymes:  Basename 03/16/11 0425  CKTOTAL 88  CKMB 3.0  CKMBINDEX --  TROPONINI <0.30   BNP:  Basename 03/15/11 2204  PROBNP 1621.0*   D-Dimer: No results found for this basename:  DDIMER:2 in the last 72 hours CBG:  Basename 03/16/11 0404 03/15/11 2343 03/15/11 2021 03/15/11 1624 03/15/11 1153 03/15/11 0728  GLUCAP 119* 124* 103* 117* 139* 85   Hemoglobin A1C: No results found for this basename: HGBA1C in the last 72 hours Fasting Lipid Panel: No results found for this basename: CHOL,HDL,LDLCALC,TRIG,CHOLHDL,LDLDIRECT in the last 72 hours Thyroid Function Tests: No results found for this basename: TSH,T4TOTAL,FREET4,T3FREE,THYROIDAB in the last 72 hours Anemia Panel: No results found for this basename: VITAMINB12,FOLATE,FERRITIN,TIBC,IRON,RETICCTPCT in the last 72 hours Coagulation: No results found for this basename: LABPROT:2,INR:2 in the last 72 hours Urine Drug Screen: Drugs of Abuse  No results found for this basename: labopia, cocainscrnur, labbenz, amphetmu, thcu, labbarb    Alcohol Level: No results found for this basename: ETH:2 in the last 72 hours Urinalysis:  Misc. Labs:  ABGS  Basename 03/16/11 0528  PHART 7.554*  PO2ART 63.7*  TCO2 --  HCO3 23.2   CULTURES Recent Results (from the past 240 hour(s))  CULTURE, BLOOD (ROUTINE X 2)     Status: Normal   Collection Time   03/06/11  8:38 AM      Component Value Range Status Comment   Specimen Description BLOOD SITE NOT SPECIFIED DRAWN BY RN   Final    Special Requests BOTTLES DRAWN AEROBIC AND ANAEROBIC 6CC   Final    Culture NO GROWTH 5 DAYS   Final    Report Status 03/11/2011 FINAL   Final  CULTURE, BLOOD (ROUTINE X 2)     Status: Normal   Collection Time   03/06/11  8:56 AM      Component Value Range Status Comment   Specimen Description BLOOD LEFT ARM   Final    Special Requests BOTTLES DRAWN AEROBIC AND ANAEROBIC [facility]   Final    Culture NO GROWTH 5 DAYS   Final    Report Status 03/11/2011 FINAL   Final    Studies/Results: Dg Chest Port 1 View  03/15/2011  *RADIOLOGY REPORT*  Clinical Data: Extubated, increasing white blood cell count  PORTABLE CHEST - 1 VIEW  Comparison:  03/13/2011  Findings: Interval extubation.  Bilateral lower lobe airspace opacities, mildly increased. No pleural effusion or pneumothorax.  Cardiomediastinal silhouette is within normal limits.  Enteric tube terminating at the antropyloric region.  Stable right PICC.  IMPRESSION: Interval extubation.  Bilateral lower lobe airspace opacities, mildly increased.  Original Report Authenticated By: Charline Bills, M.D.    Medications:  Scheduled:   . albuterol  2.5 mg Nebulization Q6H  . antiseptic oral rinse  1 application Mouth Rinse QID  . aspirin  81 mg Oral Daily  . ceFEPime (MAXIPIME) IV  1 g Intravenous Q8H  . chlorhexidine  15 mL Mouth/Throat BID  . enoxaparin  40 mg Subcutaneous Q24H  . furosemide  20 mg Intravenous Q12H  . insulin aspart  0-9 Units Subcutaneous Q4H  . ipratropium  0.5 mg Nebulization Q6H  . levofloxacin (LEVAQUIN) IV  750 mg Intravenous Q24H  . metoprolol tartrate  12.5 mg Oral BID  . pantoprazole sodium  40 mg Per Tube Q1200  . potassium chloride  40 mEq Oral TID  . predniSONE  20 mg Oral Q breakfast  . sodium chloride  10 mL Intracatheter Q12H  . sodium chloride      . vancomycin  1,000 mg Intravenous Q12H  . DISCONTD: azithromycin  500 mg Intravenous Q24H  . DISCONTD: insulin glargine  10 Units Subcutaneous BID  . DISCONTD: insulin glargine  5 Units Subcutaneous BID  . DISCONTD: methylPREDNISolone (SOLU-MEDROL) injection  20 mg Intravenous Q24H  . DISCONTD: methylPREDNISolone (SOLU-MEDROL) injection  30 mg Intravenous Q24H  . DISCONTD: piperacillin-tazobactam (ZOSYN)  IV  3.375 g Intravenous Q8H  . DISCONTD: potassium chloride  10 mEq Intravenous Q1 Hr x 6  . DISCONTD: potassium chloride  20 mEq Oral BID  . DISCONTD: potassium chloride  40 mEq Oral TID  . DISCONTD: vancomycin  1,000 mg Intravenous Q12H   Continuous:   . dextrose 5 % with KCl 20 mEq / [name] 20 mEq (03/16/11 0600)  . fentaNYL infusion INTRAVENOUS Stopped (03/13/11 0700)  . midazolam  (VERSED) infusion Stopped (03/13/11 0700)  . DISCONTD: feeding supplement (JEVITY 1.2) 1,000 mL (03/12/11 1618)   JYN:[id number], ondansetron (ZOFRAN) IV, ondansetron, sodium chloride  Assesment: He had an episode of acute respiratory failure related to community-acquired pneumonia. He has become more hypoxic and his infiltrates are getting worse and I think it's probably that he has aspirated. He has multiple medical problems as listed below. Principal Problem:  *Respiratory failure Active Problems:  CAP (community acquired pneumonia)  Toxic encephalopathy  Dehydration  Rhabdomyolysis  Benign hypertension  Hyperlipidemia  GERD (gastroesophageal reflux disease)  Chronic pain  Hypokalemia  Anemia  Elevated troponin I level  Hypernatremia  Hyperglycemia  Elevated LFTs  Non-ST elevation MI (NSTEMI)  Pulmonary edema    Plan: I agree with current treatments. I don't think is much else that we can do  at this point. He is on broad-spectrum antibiotics    LOS: 13 days   Marcus [name] 03/16/2011, 7:48 AM

## 2011-03-16 NOTE — Progress Notes (Signed)
Subjective: The patient is sitting up in bed. He is alert but does not answer to his name or to directions. No nausea vomiting overnight according to nursing, but nothing has been given through the NG tube.  Objective: Vital signs in last 24 hours: Filed Vitals:   03/16/11 0400 03/16/11 0500 03/16/11 0600 03/16/11 0729  BP: 145/69 109/53 140/63   Pulse: 81 82 84   Temp: 99.5 F (37.5 C)     TempSrc: Axillary     Resp: 33 32 31   Height:      Weight:  63.8 kg (140 lb 10.5 oz)    SpO2: 100% 99% 100% 100%    Intake/Output Summary (Last 24 hours) at 03/16/11 0910 Last data filed at 03/16/11 0600  Gross per 24 hour  Intake   1202 ml  Output   2300 ml  Net  -1098 ml    Weight change: 0.2 kg (7.1 oz)  Physical exam: Lungs: Clear anteriorly with decreased breath sounds and a few crackles in the bases. Heart: S1, S2, with a soft systolic murmur. Abdomen: Hypoactive bowel sounds, mildly to moderately tender in the epigastrium, no masses, no rigidity, no guarding. Extremities: No pedal edema. Neurologic: He is alert. He gives eye contact. However, he does not respond to his name or directions. He withdraws his left hand much more aggressively than he withdraws his right hand with provocation. He moves his feet bilaterally but less so on the  right with provocation.  Lab Results: Basic Metabolic Panel:  Basename 03/16/11 0425 03/15/11 1518  NA 127* 127*  K 3.0* 3.3*  CL 92* 90*  CO2 26 27  GLUCOSE 126* 121*  BUN 22 23  CREATININE 0.86 0.83  CALCIUM 9.2 9.3  MG 1.9 --  PHOS -- --   Liver Function Tests: No results found for this basename: AST:2,ALT:2,ALKPHOS:2,BILITOT:2,PROT:2,ALBUMIN:2 in the last 72 hours No results found for this basename: LIPASE:2,AMYLASE:2 in the last 72 hours No results found for this basename: AMMONIA:2 in the last 72 hours CBC:  Basename 03/16/11 0425 03/15/11 0451  WBC 15.5* 14.2*  NEUTROABS -- --  HGB 9.6* 10.0*  HCT 28.0* 28.9*  MCV 92.4  91.5  PLT 688* 643*   Cardiac Enzymes:  Basename 03/16/11 0425  CKTOTAL 88  CKMB 3.0  CKMBINDEX --  TROPONINI <0.30   BNP:  [facility] 03/15/11 2204  PROBNP 1621.0*   D-Dimer: No results found for this basename: DDIMER:2 in the last 72 hours CBG:  Basename 03/16/11 0743 03/16/11 0404 03/15/11 2343 03/15/11 2021 03/15/11 1624 03/15/11 1153  GLUCAP 122* 119* 124* 103* 117* 139*   Hemoglobin A1C: No results found for this basename: HGBA1C in the last 72 hours Fasting Lipid Panel: No results found for this basename: CHOL,HDL,LDLCALC,TRIG,CHOLHDL,LDLDIRECT in the last 72 hours Thyroid Function Tests: No results found for this basename: TSH,T4TOTAL,FREET4,T3FREE,THYROIDAB in the last 72 hours Anemia Panel: No results found for this basename: VITAMINB12,FOLATE,FERRITIN,TIBC,IRON,RETICCTPCT in the last 72 hours Coagulation: No results found for this basename: LABPROT:2,INR:2 in the last 72 hours Urine Drug Screen: Drugs of Abuse  No results found for this basename: labopia, cocainscrnur, labbenz, amphetmu, thcu, labbarb    Alcohol Level: No results found for this basename: ETH:2 in the last 72 hours   Micro: No results found for this or any previous visit (from the past 240 hour(s)).  Studies/Results: Dg Chest Port 1 View  03/15/2011  *RADIOLOGY REPORT*  Clinical Data: Extubated, increasing white blood cell count  PORTABLE CHEST - 1  VIEW  Comparison: 03/13/2011  Findings: Interval extubation.  Bilateral lower lobe airspace opacities, mildly increased. No pleural effusion or pneumothorax.  Cardiomediastinal silhouette is within normal limits.  Enteric tube terminating at the antropyloric region.  Stable right PICC.  IMPRESSION: Interval extubation.  Bilateral lower lobe airspace opacities, mildly increased.  Original Report Authenticated By: Charline Bills, M.D.    Medications: I have reviewed the patient's current medications.  Assessment: Principal Problem:  *Respiratory  failure Active Problems:  CAP (community acquired pneumonia)  Toxic encephalopathy  Dehydration  Rhabdomyolysis  Benign hypertension  Hyperlipidemia  GERD (gastroesophageal reflux disease)  Chronic pain  Hypokalemia  Anemia  Elevated troponin I level  Hypernatremia  Hyperglycemia  Elevated LFTs  Non-ST elevation MI (NSTEMI)  Pulmonary edema  Thrombocytosis  Hyponatremia   Acute respiratory failure secondary to community-acquired pneumonia. Query superimposed pulmonary edema. His chest x-ray yesterday has a slight increase in bibasilar opacities. His urinalysis still has WBCs. He is status post extubation x3 days now. There is suspicion of aspiration as his oxygen saturations had decreased over the past 24 hours. He is now oxygenating 100% on a nonrebreather. He is now on Levaquin, cefepime, and vancomycin. Steroids have been tapered down to 20 mg daily. He is on Lasix 20 mg IV every 12 hours. He continues on Atrovent and albuterol nebulizations.  Nausea and vomiting yesterday. He also has Abdominal tenderness. Status post NG tube placement yesterday. It is draining bilious fluid. We'll order a KUB to assess for ileus or bowel obstruction. We'll also check his LFTs and lipase.  Hyponatremia and hypokalemia. This may be secondary to hypotonic IV fluids and NG drainage. His blood magnesium level is within normal limits.  Toxic Metabolic encephalopathy. His CT scan was unremarkable. His EEG results were unimpressive. Overall, he is more alert, and according to Dr. Rito Ehrlich and the nursing staff, he is starting to respond. This morning, he did not respond to my directions or name.  Plan:  We'll change his IV fluids to normal saline with dextrose and potassium added. We'll replete his potassium chloride with IV runs.  We'll continue to hold medications and fluids through the NG. We'll order a KUB to assess for an ileus and/or bowel obstruction. We'll change his Protonix to IV. Will order  LFTs and lipase to rule out gallbladder or pancreatic abnormalities.  We'll order speech therapy consultation to assess for dysphagia and/or aspiration.   LOS: 13 days   Alexyss Balzarini 03/16/2011, 9:10 AM

## 2011-03-17 ENCOUNTER — Inpatient Hospital Stay (HOSPITAL_COMMUNITY): Payer: Medicare Other

## 2011-03-17 ENCOUNTER — Encounter (HOSPITAL_COMMUNITY): Payer: Self-pay | Admitting: Internal Medicine

## 2011-03-17 DIAGNOSIS — H47619 Cortical blindness, unspecified side of brain: Secondary | ICD-10-CM

## 2011-03-17 DIAGNOSIS — K5641 Fecal impaction: Secondary | ICD-10-CM

## 2011-03-17 HISTORY — DX: Fecal impaction: K56.41

## 2011-03-17 HISTORY — DX: Cortical blindness, unspecified side of brain: H47.619

## 2011-03-17 LAB — GLUCOSE, CAPILLARY
Glucose-Capillary: 101 mg/dL — ABNORMAL HIGH (ref 70–99)
Glucose-Capillary: 133 mg/dL — ABNORMAL HIGH (ref 70–99)

## 2011-03-17 LAB — BLOOD GAS, ARTERIAL
Bicarbonate: 19.5 mEq/L — ABNORMAL LOW (ref 20.0–24.0)
O2 Content: 5 L/min
Patient temperature: 37
pCO2 arterial: 23.4 mmHg — ABNORMAL LOW (ref 35.0–45.0)
pH, Arterial: 7.531 — ABNORMAL HIGH (ref 7.350–7.450)
pO2, Arterial: 56.8 mmHg — ABNORMAL LOW (ref 80.0–100.0)

## 2011-03-17 LAB — COMPREHENSIVE METABOLIC PANEL
ALT: 16 U/L (ref 0–53)
AST: 14 U/L (ref 0–37)
CO2: 24 mEq/L (ref 19–32)
Chloride: 101 mEq/L (ref 96–112)
GFR calc non Af Amer: 85 mL/min — ABNORMAL LOW (ref >90)
Glucose, Bld: 124 mg/dL — ABNORMAL HIGH (ref 70–99)
Sodium: 134 mEq/L — ABNORMAL LOW (ref 135–145)
Total Bilirubin: 0.5 mg/dL (ref 0.3–1.2)

## 2011-03-17 LAB — VANCOMYCIN, TROUGH: Vancomycin Tr: 24.7 ug/mL — ABNORMAL HIGH (ref 10.0–20.0)

## 2011-03-17 LAB — CBC
MCH: 31.7 pg (ref 26.0–34.0)
MCHC: 34.4 g/dL (ref 30.0–36.0)
Platelets: 671 10*3/uL — ABNORMAL HIGH (ref 150–400)
RDW: 13.4 % (ref 11.5–15.5)

## 2011-03-17 MED ORDER — METOPROLOL TARTRATE 25 MG PO TABS
12.5000 mg | ORAL_TABLET | Freq: Two times a day (BID) | ORAL | Status: DC
  Administered 2011-03-17: 12.5 mg via ORAL
  Filled 2011-03-17 (×2): qty 1

## 2011-03-17 MED ORDER — VANCOMYCIN HCL 1000 MG IV SOLR
750.0000 mg | Freq: Two times a day (BID) | INTRAVENOUS | Status: DC
  Administered 2011-03-17 – 2011-03-18 (×3): 750 mg via INTRAVENOUS
  Filled 2011-03-17 (×5): qty 750

## 2011-03-17 MED ORDER — LORAZEPAM 2 MG/ML IJ SOLN
1.0000 mg | Freq: Once | INTRAMUSCULAR | Status: AC
  Administered 2011-03-18: 1 mg via INTRAVENOUS
  Filled 2011-03-17: qty 1

## 2011-03-17 MED ORDER — POTASSIUM CHLORIDE CRYS ER 20 MEQ PO TBCR
30.0000 meq | EXTENDED_RELEASE_TABLET | Freq: Two times a day (BID) | ORAL | Status: DC
  Administered 2011-03-17 (×2): 30 meq via ORAL
  Filled 2011-03-17 (×2): qty 1

## 2011-03-17 MED ORDER — GADOBENATE DIMEGLUMINE 529 MG/ML IV SOLN
13.0000 mL | Freq: Once | INTRAVENOUS | Status: AC | PRN
  Administered 2011-03-17: 13 mL via INTRAVENOUS

## 2011-03-17 MED ORDER — JEVITY 1.2 CAL PO LIQD
1000.0000 mL | ORAL | Status: DC
  Administered 2011-03-17: 1000 mL via ORAL
  Filled 2011-03-17 (×5): qty 1000

## 2011-03-17 MED ORDER — METOPROLOL TARTRATE 25 MG PO TABS
12.5000 mg | ORAL_TABLET | Freq: Two times a day (BID) | ORAL | Status: DC
  Administered 2011-03-17 – 2011-03-20 (×2): 12.5 mg via ORAL
  Administered 2011-03-21: via ORAL
  Filled 2011-03-17 (×2): qty 1

## 2011-03-17 NOTE — Progress Notes (Signed)
Physical Therapy Evaluation Patient Details Name: Marcus [name] MRN: [id number] DOB: [birth year] Today's Date: 03/17/2011  Problem List:  Patient Active Problem List  Diagnoses  . CAP (community acquired pneumonia)  . Toxic encephalopathy  . Dehydration  . Rhabdomyolysis  . Benign hypertension  . Hyperlipidemia  . GERD (gastroesophageal reflux disease)  . Chronic pain  . Respiratory failure  . Hypokalemia  . Anemia  . Elevated troponin I level  . Hypernatremia  . Hyperglycemia  . Elevated LFTs  . Non-ST elevation MI (NSTEMI)  . Pulmonary edema  . Thrombocytosis  . Hyponatremia  . Fecal impaction  . Cortical blindness    Past Medical History:  Past Medical History  Diagnosis Date  . Hypertension   . GERD (gastroesophageal reflux disease)   . Hypercholesteremia   . Stroke   . Anemia 03/06/2011  . Smoker   . Non-ST elevation MI (NSTEMI) 03/07/2011    With PNA and resp failure  . Fecal impaction 03/17/2011  . 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 No pertinent past surgical history.  PT Assessment/Plan/Recommendation PT Assessment Clinical Impression Statement: pt has been very difficult to assess due to severe impairment of communication, although he does nod yes/no to questions at times...he appears to have poor head control, strong tone in R extremeties and profound wekness of L extremeties...total support is needed to sit at EOB...he will most probably need  extensive rehab at d/c PT Recommendation/Assessment: Patient will need skilled PT in the acute care venue PT Problem List: Decreased strength;Decreased activity tolerance;Decreased balance;Decreased mobility;Decreased coordination;Decreased cognition;Decreased knowledge of use of DME;Decreased safety awareness;Decreased knowledge of precautions;Impaired tone Barriers to Discharge:  (unknown) PT Therapy Diagnosis : Generalized weakness PT Plan PT Frequency: Min 3X/week PT  Treatment/Interventions: Functional mobility training;Therapeutic exercise;Neuromuscular re-education;Balance training;Patient/family education PT Recommendation Follow Up Recommendations: Skilled nursing facility Equipment Recommended: Defer to next venue PT Goals  Acute Rehab PT Goals PT Goal Formulation: Patient unable to participate in goal setting Time For Goal Achievement: 2 weeks Pt will Roll Supine to Right Side: with max assist Pt will Sit at [facility] of Bed: with max assist;3-5 min  PT Evaluation Precautions/Restrictions  Precautions Precautions: Fall Required Braces or Orthoses: No Restrictions Weight Bearing Restrictions: No Prior Functioning  Home Living Additional Comments: pt   unable to give any information, no family available Prior Function Comments: unknown Cognition Cognition Arousal/Alertness: Awake/alert Overall Cognitive Status: Difficult to assess Difficult to assess due to: impaired communication Orientation Level: Oriented to person Sensation/Coordination Sensation Light Touch: Appears Intact Stereognosis: Not tested Hot/Cold: Not tested Proprioception: Not tested Coordination Gross Motor Movements are Fluid and Coordinated: No Coordination and Movement Description: pt only able to move L extremeties slightly, appears to have strong extensor tone of RLE, mod fllexor tone RUE Extremity Assessment RUE Assessment RUE Assessment: Exceptions to [facility] RUE Tone RUE Tone Comments: mod flexor tone LUE Assessment LUE Assessment: Exceptions to [facility] LUE Strength LUE Overall Strength Comments: 1/5 strength  L shoulder,          2/5 strength biceps/triceps, 1/5     at hand RLE Assessment RLE Assessment: Exceptions to [facility] RLE Tone RLE Tone Comments: strong extensor tone, no active motion...moans with any motion of R ankle LLE Assessment LLE Assessment: Exceptions to [facility] LLE Strength LLE Overall Strength Comments: trace strength    at LLE with moans whenever L  hip is moved Mobility (including Balance) Bed Mobility Bed Mobility: Yes Rolling Right: 1: +1 Total assist Rolling Right Details (  indicate cue type and reason): no attempt of pt to assist Rolling Left: 1: +1 Total assist Rolling Left Details (indicate cue type and reason): pt does slightly trun head L while being turned Supine to Sit: 1: +1 Total assist Sit to Supine: 1: +1 Total assist Transfers Transfers: No Ambulation/Gait Ambulation/Gait: No Stairs: No Wheelchair Mobility Wheelchair Mobility: No  Balance Balance Assessed: Yes Static Sitting Balance Static Sitting - Level of Assistance: 1: +1 Total assist Static Sitting - Comment/# of Minutes: leans severely ro the R--held up for about 2 min Exercise    End of Session PT - End of Session Activity Tolerance: Other (comment) (unable to tell...flat affect) Patient left: in bed;with call bell in reach General Behavior During Session: Flat affect Cognition: Impaired  Konrad Penta 03/17/2011, 12:21 PM

## 2011-03-17 NOTE — Procedures (Signed)
Modified Barium Swallow Procedure Note Patient Details  Name: Marcus [name] MRN: [id number] Date of Birth: 1941/03/03  Today's Date: 03/17/2011 Time:  - 14:30    Past Medical History:  Past Medical History  Diagnosis Date  . Hypertension   . GERD (gastroesophageal reflux disease)   . Hypercholesteremia   . Stroke   . Anemia 03/06/2011  . Smoker   . Non-ST elevation MI (NSTEMI) 03/07/2011    With PNA and resp failure  . Fecal impaction 03/17/2011  . 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 No pertinent past surgical history.  HPI:   See previous BSE.  Recommendation/Prognosis  Clinical Impression Dysphagia Diagnosis: Moderate pharyngeal phase dysphagia;Severe pharyngeal phase dysphagia;Moderate oral phase dysphagia;Severe oral phase dysphagia Clinical impression: Cognitive based dysphagia with strong component of genralized weakness. Moderate/Severe oropharyngeal dysphagia characterized by oppositional tongue movement (tongue thrust), weak lingual manipulation, decreased posterior bolus propulsion, premature spillage, delay in swallow initiation with swallow trigger at the level of the pyriforms, decreased hyolaryngeal excursion, decreased tongue base retraction, decreased epiglottic deflection, with overall decreased pharyngeal pressure resulting in penetration/aspiration of thins and nectar and significant/moderate pharyngeal residue with all consistencies.  Pt appeared to have posterior pharyngeal wall thickening and enlarged tonsils which negatively impacted epiglottic deflection. This also could represent standing pharyngeal secretions that residual barium mixed with, but it appeared static and did not move with weak cough/swallow. Pt with audible breathing, not stridorous, but question if some of his breathing pattern is related to tonsillar enlargement? Pt would not allow SLP to explore posterior oral cavity at bedside after the study. Pt could lkely be  started on puree with honey thick liquids by teaspoon presentation, but would continue to be at risk for aspiration due to weakness/decrease pharyngeal pressure with threat of aspiration for residuals. I would recommend NG tube feeds for a couple of days with SLP reassessment on Thursday. Pt generally has not allowed me to give him much by mouth (pushes against spoon with tongue) and would likely not meet nutritional needs currently. Swallow Recommendations General Recommendation: NPO;Alternative means - temporary;Ice chips PRN after oral care Follow up Recommendations: Skilled Nursing facility Prognosis Prognosis for Safe Diet Advancement: Guarded Barriers to Reach Goals: Cognitive deficits;Motivation Individuals Consulted Consulted and Agree with Results and Recommendations: Patient unable/family or caregiver not available;RN  SLP Assessment/Plan  Follow up on Thursday for po readiness.  SLP Goals   Indirect dysphagia intervention, po trials.  General:  Date of Onset: 03/03/11 HPI: Marcus [name] is a 71 year old man who was admitted a couple of weeks ago from home with AMS. He was extubated on Friday and has slowly become more alert. NG tube feedings have been restarted and pt tolerating without incident. He is able to track me visually and shakes head "no" when asked if he is blind, but nods "yes" when asked if he has trouble seeing. No vocalizations today, but is nodding or shaking head (very slight) to yes/no questions.  Type of Study: Initial MBS Diet Prior to this Study: NPO Temperature Spikes Noted: No Respiratory Status: Supplemental O2 delivered via (comment) (Audible breathing, RR=35) History of Intubation: Yes Length of Intubations (days): 9 days Date extubated: 03/13/11 Behavior/Cognition: Requires cueing;Alert (variably responsive) Oral Cavity - Dentition: Edentulous Oral Motor / Sensory Function: Impaired - see Bedside swallow eval Vision: Impaired for self-feeding Patient  Positioning: Upright in chair Baseline Vocal Quality: Aphonic Volitional Cough: Weak;Congested;Cognitively unable to elicit Volitional Swallow: Unable to elicit Anatomy: Other (Comment) (Appears to  have enlarged tonsils & thickening along PPWall) Pharyngeal Secretions: Standing secretions in (comment) (posterior pharyngeal wall thickening vs standing secretions ) Ice chips: Not tested  Reason for Referral:  Objectively evaluate swallow function due to concerns of possible aspiration and identify appropriate diet and compensatory strategies as needed.  Oral Phase Oral Preparation/Oral Phase Oral Phase: Impaired Oral - Honey Oral - Honey Teaspoon: Weak lingual manipulation;Lingual pumping;Delayed oral transit;Reduced posterior propulsion;Lingual/palatal residue Oral - Nectar Oral - Nectar Straw: Weak lingual manipulation;Lingual pumping;Delayed oral transit;Reduced posterior propulsion;Lingual/palatal residue Oral - Thin Oral - Thin Cup: Right anterior bolus loss;Weak lingual manipulation;Reduced posterior propulsion;Lingual/palatal residue;Delayed oral transit Oral - Solids Oral - Puree: Weak lingual manipulation;Reduced posterior propulsion;Lingual pumping;Delayed oral transit;Lingual/palatal residue Oral Phase - Comment Oral Phase - Comment: Overall weak lingual manipulation with lingual pumping and decreased anterior to posterior transit.  Pharyngeal Phase  Pharyngeal Phase Pharyngeal Phase: Impaired Pharyngeal - Honey Pharyngeal - Honey Cup: Delayed swallow initiation;Premature spillage to pyriform;Reduced pharyngeal peristalsis;Reduced epiglottic inversion;Reduced anterior laryngeal mobility;Reduced laryngeal elevation;Reduced tongue base retraction;Penetration/Aspiration during swallow;Pharyngeal residue - valleculae;Pharyngeal residue - pyriform;Pharyngeal residue - posterior pharnyx;Lateral channel residue;Compensatory strategies attempted (Comment) (better with teaspoon  presentation) Penetration/Aspiration details (honey cup): Material enters airway, remains ABOVE vocal cords then ejected out Pharyngeal - Nectar Pharyngeal - Nectar Straw: Delayed swallow initiation;Reduced pharyngeal peristalsis;Reduced epiglottic inversion;Reduced anterior laryngeal mobility;Premature spillage to pyriform;Reduced laryngeal elevation;Reduced tongue base retraction;Penetration/Aspiration during swallow;Pharyngeal residue - pyriform;Pharyngeal residue - posterior pharnyx;Lateral channel residue (pt unable to follow) Penetration/Aspiration details (nectar straw): Material enters airway, remains ABOVE vocal cords then ejected out;Material enters airway, remains ABOVE vocal cords and not ejected out Pharyngeal - Thin Pharyngeal - Thin Cup: Delayed swallow initiation;Premature spillage to pyriform;Reduced laryngeal elevation;Reduced anterior laryngeal mobility;Reduced epiglottic inversion;Reduced pharyngeal peristalsis;Reduced airway/laryngeal closure;Reduced tongue base retraction;Penetration/Aspiration during swallow;Trace aspiration;Pharyngeal residue - valleculae;Pharyngeal residue - pyriform;Pharyngeal residue - posterior pharnyx;Inter-arytenoid space residue;Lateral channel residue Penetration/Aspiration details (thin cup): Material enters airway, remains ABOVE vocal cords then ejected out;Material enters airway, remains ABOVE vocal cords and not ejected out;Material enters airway, passes BELOW cords and not ejected out despite cough attempt by patient Pharyngeal - Solids Pharyngeal - Puree: Delayed swallow initiation;Premature spillage to valleculae;Reduced pharyngeal peristalsis;Reduced epiglottic inversion;Reduced anterior laryngeal mobility;Reduced laryngeal elevation;Reduced tongue base retraction;Pharyngeal residue - pyriform;Pharyngeal residue - posterior pharnyx;Pharyngeal residue - valleculae Pharyngeal Phase - Comment Pharyngeal Comment: Overall weak pharyngeal pressure with  delay in swallow initiation.  Cervical Esophageal Phase  Cervical Esophageal Phase Cervical Esophageal Phase: WFL (delayed emptying in distal third of esophagus)  Havery Moros, CCC-SLP [phone]   Fritzie Prioleau 03/17/2011, 4:22 PM

## 2011-03-17 NOTE — Progress Notes (Signed)
Subjective: The patient is sitting up in bed. He is nodding appropriately when I said good morning. He nodded no when I asked him if he was in pain.  Objective: Vital signs in last 24 hours: Filed Vitals:   03/17/11 0700 03/17/11 0719 03/17/11 0800 03/17/11 0900  BP: 145/59  141/65 148/66  Pulse: 85  92 87  Temp:   98.6 F (37 C)   TempSrc:   Oral   Resp: 32  33 35  Height:      Weight:      SpO2: 97% 98% 96% 98%    Intake/Output Summary (Last 24 hours) at 03/17/11 0945 Last data filed at 03/17/11 0800  Gross per 24 hour  Intake   2694 ml  Output   2350 ml  Net    344 ml    Weight change:   Physical exam: Lungs: Few bilateral crackles. He has tachypnea with deep breathing and signs, which does not appear as if he is in respiratory distress but it is an unusual breathing pattern. Heart: S1, S2, with a soft systolic murmur. Abdomen: Hypoactive bowel sounds, mildly to moderately tender in the epigastrium, no masses, no rigidity, no guarding. Extremities: No pedal edema. Neurologic: He is alert. He gives eye contact. He nods to questions. He nods just when I asked him if he has a daughter. He did not answer when asked her name. He tried to squeeze my hands with his hands less so on the right and a little more on the left. He faintly wiggles his toes.  Lab Results: Basic Metabolic Panel:  Basename 03/17/11 0421 03/16/11 0425  NA 134* 127*  K 3.4* 3.0*  CL 101 92*  CO2 24 26  GLUCOSE 124* 126*  BUN 20 22  CREATININE 0.84 0.86  CALCIUM 9.0 9.2  MG -- 1.9  PHOS -- --   Liver Function Tests:  Basename 03/17/11 0421 03/16/11 0921  AST 14 17  ALT 16 18  ALKPHOS 58 53  BILITOT 0.5 0.4  PROT 6.4 6.6  ALBUMIN 2.3* 2.4*    Basename 03/17/11 0748 03/16/11 0921  LIPASE 162* 174*  AMYLASE -- --   No results found for this basename: AMMONIA:2 in the last 72 hours CBC:  Basename 03/17/11 0421 03/16/11 0425  WBC 15.0* 15.5*  NEUTROABS -- --  HGB 9.7* 9.6*  HCT 28.2*  28.0*  MCV 92.2 92.4  PLT 671* 688*   Cardiac Enzymes:  Basename 03/16/11 0425  CKTOTAL 88  CKMB 3.0  CKMBINDEX --  TROPONINI <0.30   BNP:  [facility] 03/15/11 2204  PROBNP 1621.0*   D-Dimer: No results found for this basename: DDIMER:2 in the last 72 hours CBG:  Basename 03/17/11 0750 03/17/11 0351 03/16/11 2331 03/16/11 1949 03/16/11 1556 03/16/11 1141  GLUCAP 130* 119* 133* 130* 114* 162*   Hemoglobin A1C: No results found for this basename: HGBA1C in the last 72 hours Fasting Lipid Panel: No results found for this basename: CHOL,HDL,LDLCALC,TRIG,CHOLHDL,LDLDIRECT in the last 72 hours Thyroid Function Tests: No results found for this basename: TSH,T4TOTAL,FREET4,T3FREE,THYROIDAB in the last 72 hours Anemia Panel: No results found for this basename: VITAMINB12,FOLATE,FERRITIN,TIBC,IRON,RETICCTPCT in the last 72 hours Coagulation: No results found for this basename: LABPROT:2,INR:2 in the last 72 hours Urine Drug Screen: Drugs of Abuse  No results found for this basename: labopia,  cocainscrnur,  labbenz,  amphetmu,  thcu,  labbarb    Alcohol Level: No results found for this basename: ETH:2 in the last 72 hours  Micro: No results found for this or any previous visit (from the past 240 hour(s)).  Studies/Results: Dg Chest Port 1 View  03/15/2011  *RADIOLOGY REPORT*  Clinical Data: Extubated, increasing white blood cell count  PORTABLE CHEST - 1 VIEW  Comparison: 03/13/2011  Findings: Interval extubation.  Bilateral lower lobe airspace opacities, mildly increased. No pleural effusion or pneumothorax.  Cardiomediastinal silhouette is within normal limits.  Enteric tube terminating at the antropyloric region.  Stable right PICC.  IMPRESSION: Interval extubation.  Bilateral lower lobe airspace opacities, mildly increased.  Original Report Authenticated By: Charline Bills, M.D.   Dg Abd Portable 1v  03/16/2011  *RADIOLOGY REPORT*  Clinical Data: Abdominal pain  PORTABLE  ABDOMEN - 1 VIEW  Comparison: None.  Findings: An enteric tube terminates in the third portion of the duodenum.  There is a prominent amount of stool in the rectum. Rectal stool measures approximately 7.5 cm transverse diameter.  No significant stool burden is seen within the remainder of the colon. Gas is seen within several upper normal caliber central small bowel loops.  No definite evidence of small bowel obstruction on this single view. Gas is seen scattered within normal caliber colon. The stomach is decompressed.  There are marked degenerative changes with disc space narrowing, endplate sclerosis, and osteophyte formation throughout the lumbar spine.  Heavy atherosclerotic calcification of the aortoiliac vasculature.  IMPRESSION:  1.  Prominent amount of stool in the rectum.  Question fecal impaction. 2.  Small bowel loops are upper normal in caliber, without definite evidence of small bowel obstruction. 3.  Enteric tube (question if this is a nasogastric tube) terminates in the third portion of the duodenum.  Original Report Authenticated By: Britta Mccreedy, M.D.    Medications: I have reviewed the patient's current medications.  Assessment: Principal Problem:  *Respiratory failure Active Problems:  CAP (community acquired pneumonia)  Toxic encephalopathy  Dehydration  Rhabdomyolysis  Benign hypertension  Hyperlipidemia  GERD (gastroesophageal reflux disease)  Chronic pain  Hypokalemia  Anemia  Elevated troponin I level  Hypernatremia  Hyperglycemia  Elevated LFTs  Non-ST elevation MI (NSTEMI)  Pulmonary edema  Thrombocytosis  Hyponatremia  Fecal impaction   Acute respiratory failure secondary to community-acquired pneumonia. Query superimposed pulmonary edema. His chest x-ray on 1/6 had a slight increase in bibasilar opacities. His urinalysis still has WBCs. He is status post extubation. There is suspicion of aspiration as his oxygen saturations had decreased recently. He is now  oxygenating 98% on 2-3 L. He is now on Levaquin, cefepime, and vancomycin. Steroids have been tapered down to 20 mg daily. He is on Lasix 20 mg IV every 12 hours. He continues on Atrovent and albuterol nebulizations.  Usual breathing pattern with tachypnea. Query neurologically driven.  Nausea and vomiting, secondary to fecal impaction versus questionable acute pancreatitis. The results of the KUB lipase and LFTs were noted from yesterday. His LFTs are within normal limits. His lipase is elevated although I'm not convinced that he has acute pancreatitis. He was given an enema yesterday with good results. Will retry tube feedings at a slower rate. We'll check an ultrasound of his abdomen for further evaluation.  Hyponatremia and hypokalemia. Resolving with appropriate change in IV fluids and potassium chloride runs.  Toxic Metabolic encephalopathy. Overall, he appears to be responding a little better today. Per her neurologist Dr. 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 Now that he is extubated and a little more alert,  we'll order MRI of his brain for further evaluation. His CT scan was unremarkable. His EEG results were unimpressive.   Dysphagia. The speech therapist evaluation is noted. The plan is for reassessment today and MBSS. Will restart gentle tube feedings without advancement until it is clear that he is tolerating it without nausea and vomiting. We'll resume giving him his medications via the NG tube.  Plan:  1. As above, will restart gentle tube feedings. Will restart metoprolol via the NG tube. We'll continue IV fluids as ordered. We'll continue to replete potassium chloride via the NG tube. We will order an ultrasound of his abdomen for further assessment. We'll followup lipase level. We'll order an MRI of his brain to assess for any acute abnormalities. Will await the speech therapist followup evaluation.   LOS: 14 days     Marcus [name] 03/17/2011, 9:45 AM

## 2011-03-17 NOTE — Consult Note (Signed)
ANTIBIOTIC CONSULT NOTE   Pharmacy Consult for Vancomycin, Cefepime, Levaquin Indication: pneumonia  No Known Allergies  Patient Measurements: Height: 5\' 4"  (162.6 cm) Weight: 140 lb 10.5 oz (63.8 kg) IBW/kg (Calculated) : 59.2   Vital Signs: Temp: 98.5 F (36.9 C) (01/08 0400) Temp src: Oral (01/08 0400) BP: 140/59 mmHg (01/08 0600) Pulse Rate: 87  (01/08 0600) Intake/Output from previous day: 01/07 0701 - 01/08 0700 In: 2934 [I.V.:1550; NG/GT:120; IV Piggyback:1264] Out: 2350 [Urine:1900; Emesis/NG output:450] Intake/Output from this shift:    Labs:  Basename 03/17/11 0421 03/16/11 0425 03/15/11 1518 03/15/11 0451  WBC 15.0* 15.5* -- 14.2*  HGB 9.7* 9.6* -- 10.0*  PLT 671* 688* -- 643*  LABCREA -- -- -- --  CREATININE 0.84 0.86 0.83 --   Estimated Creatinine Clearance: 66.6 ml/min (by C-G formula based on Cr of 0.84).  Basename 03/17/11 0629  VANCOTROUGH 24.7*  VANCOPEAK --  Drue Dun --  GENTTROUGH --  GENTPEAK --  GENTRANDOM --  TOBRATROUGH --  TOBRAPEAK --  TOBRARND --  AMIKACINPEAK --  AMIKACINTROU --  AMIKACIN --    Microbiology: Recent Results (from the past 720 hour(s))  CULTURE, BLOOD (ROUTINE X 2)     Status: Normal   Collection Time   03/03/11  3:05 PM      Component Value Range Status Comment   Specimen Description BLOOD RIGHT ANTECUBITAL   Final    Special Requests     Final    Value: BOTTLES DRAWN AEROBIC AND ANAEROBIC 6CC EACH BOTTLE   Culture NO GROWTH 5 DAYS   Final    Report Status 03/08/2011 FINAL   Final   CULTURE, BLOOD (ROUTINE X 2)     Status: Normal   Collection Time   03/03/11  4:31 PM      Component Value Range Status Comment   Specimen Description BLOOD RIGHT HAND   Final    Special Requests BOTTLES DRAWN AEROBIC ONLY 6CC BOTTLE   Final    Culture NO GROWTH 5 DAYS   Final    Report Status 03/08/2011 FINAL   Final   MRSA PCR SCREENING     Status: Normal   Collection Time   03/03/11  6:35 PM      Component Value Range  Status Comment   MRSA by PCR NEGATIVE  NEGATIVE  Final   CULTURE, BLOOD (ROUTINE X 2)     Status: Normal   Collection Time   03/06/11  8:38 AM      Component Value Range Status Comment   Specimen Description BLOOD SITE NOT SPECIFIED DRAWN BY RN   Final    Special Requests BOTTLES DRAWN AEROBIC AND ANAEROBIC 6CC   Final    Culture NO GROWTH 5 DAYS   Final    Report Status 03/11/2011 FINAL   Final   CULTURE, BLOOD (ROUTINE X 2)     Status: Normal   Collection Time   03/06/11  8:56 AM      Component Value Range Status Comment   Specimen Description BLOOD LEFT ARM   Final    Special Requests BOTTLES DRAWN AEROBIC AND ANAEROBIC 6CC   Final    Culture NO GROWTH 5 DAYS   Final    Report Status 03/11/2011 FINAL   Final    Medical History: Past Medical History  Diagnosis Date  . Hypertension   . GERD (gastroesophageal reflux disease)   . Hypercholesteremia   . Stroke   . Anemia 03/06/2011  . Smoker   .  Non-ST elevation MI (NSTEMI) 03/07/2011    With PNA and resp failure  . Fecal impaction 03/17/2011   Medications:  Scheduled:     . albuterol  2.5 mg Nebulization Q6H  . antiseptic oral rinse  1 application Mouth Rinse QID  . aspirin  81 mg Oral Daily  . ceFEPime (MAXIPIME) IV  1 g Intravenous Q8H  . chlorhexidine  15 mL Mouth/Throat BID  . enoxaparin  40 mg Subcutaneous Q24H  . furosemide  20 mg Intravenous Q12H  . insulin aspart  0-9 Units Subcutaneous Q4H  . ipratropium  0.5 mg Nebulization Q6H  . levofloxacin (LEVAQUIN) IV  750 mg Intravenous Q24H  . metoprolol tartrate  12.5 mg Oral BID  . milk and molasses   Rectal Once  . pantoprazole (PROTONIX) IV  40 mg Intravenous Q24H  . potassium chloride  10 mEq Intravenous Q1 Hr x 4  . predniSONE  20 mg Oral Q breakfast  . propofol      . sodium chloride  10 mL Intracatheter Q12H  . vancomycin  750 mg Intravenous Q12H  . DISCONTD: pantoprazole sodium  40 mg Per Tube Q1200  . DISCONTD: vancomycin  1,000 mg Intravenous Q12H    Assessment: Trough above target. Renal fxn OK (clcr > 60) Levaquin and Cefepime added  Goal of Therapy:  Vancomycin trough level 15-20 mcg/ml  Plan: Discontinue Vancomycin 1gm iv q12hrs Start Vancomycin 750 mg IV every 12 hours levaquin 750mg  iv q24hrs Cefepime 1gm iv q8hrs Labs per protocol  Tomi Bamberger J 03/17/2011,8:27 AM

## 2011-03-17 NOTE — Progress Notes (Signed)
Subjective: He was poorly responsive to me but has been more responsive to others. He has a very unusual breathing pattern and I wonder if this is neurologically driven.  Objective: Vital signs in last 24 hours: Temp:  [97.9 F (36.6 C)-100 F (37.8 C)] 98.5 F (36.9 C) (01/08 0400) Pulse Rate:  [77-108] 87  (01/08 0600) Resp:  [28-44] 29  (01/08 0600) BP: (99-159)/(53-88) 140/59 mmHg (01/08 0600) SpO2:  [5 %-100 %] 98 % (01/08 0719) FiO2 (%):  [60 %] 60 % (01/07 1401) Weight change:  Last BM Date: 03/16/11  Intake/Output from previous day: 01/07 0701 - 01/08 0700 In: 2934 [I.V.:1550; NG/GT:120; IV Piggyback:1264] Out: 2350 [Urine:1900; Emesis/NG output:450]  PHYSICAL EXAM General appearance: mild distress Resp: rhonchi bilaterally Cardio: regular rate and rhythm, S1, S2 normal, no murmur, click, rub or gallop GI: soft, non-tender; bowel sounds normal; no masses,  no organomegaly Extremities: extremities normal, atraumatic, no cyanosis or edema  Lab Results:    Basic Metabolic Panel:  Basename 03/17/11 0421 03/16/11 0425  NA 134* 127*  K 3.4* 3.0*  CL 101 92*  CO2 24 26  GLUCOSE 124* 126*  BUN 20 22  CREATININE 0.84 0.86  CALCIUM 9.0 9.2  MG -- 1.9  PHOS -- --   Liver Function Tests:  Basename 03/17/11 0421 03/16/11 0921  AST 14 17  ALT 16 18  ALKPHOS 58 53  BILITOT 0.5 0.4  PROT 6.4 6.6  ALBUMIN 2.3* 2.4*    Basename 03/17/11 0748 03/16/11 0921  LIPASE 162* 174*  AMYLASE -- --   No results found for this basename: AMMONIA:2 in the last 72 hours CBC:  Basename 03/17/11 0421 03/16/11 0425  WBC 15.0* 15.5*  NEUTROABS -- --  HGB 9.7* 9.6*  HCT 28.2* 28.0*  MCV 92.2 92.4  PLT 671* 688*   Cardiac Enzymes:  Basename 03/16/11 0425  CKTOTAL 88  CKMB 3.0  CKMBINDEX --  TROPONINI <0.30   BNP:  Basename 03/15/11 2204  PROBNP 1621.0*   D-Dimer: No results found for this basename: DDIMER:2 in the last 72 hours CBG:  Basename 03/17/11 0351  03/16/11 2331 03/16/11 1949 03/16/11 1556 03/16/11 1141 03/16/11 0743  GLUCAP 119* 133* 130* 114* 162* 122*   Hemoglobin A1C: No results found for this basename: HGBA1C in the last 72 hours Fasting Lipid Panel: No results found for this basename: CHOL,HDL,LDLCALC,TRIG,CHOLHDL,LDLDIRECT in the last 72 hours Thyroid Function Tests: No results found for this basename: TSH,T4TOTAL,FREET4,T3FREE,THYROIDAB in the last 72 hours Anemia Panel: No results found for this basename: VITAMINB12,FOLATE,FERRITIN,TIBC,IRON,RETICCTPCT in the last 72 hours Coagulation: No results found for this basename: LABPROT:2,INR:2 in the last 72 hours Urine Drug Screen: Drugs of Abuse  No results found for this basename: labopia, cocainscrnur, labbenz, amphetmu, thcu, labbarb    Alcohol Level: No results found for this basename: ETH:2 in the last 72 hours Urinalysis:  Misc. Labs:  ABGS  Basename 03/16/11 0528  PHART 7.554*  PO2ART 63.7*  TCO2 --  HCO3 23.2   CULTURES No results found for this or any previous visit (from the past 240 hour(s)). Studies/Results: Dg Chest Port 1 View  03/15/2011  *RADIOLOGY REPORT*  Clinical Data: Extubated, increasing white blood cell count  PORTABLE CHEST - 1 VIEW  Comparison: 03/13/2011  Findings: Interval extubation.  Bilateral lower lobe airspace opacities, mildly increased. No pleural effusion or pneumothorax.  Cardiomediastinal silhouette is within normal limits.  Enteric tube terminating at the antropyloric region.  Stable right PICC.  IMPRESSION: Interval extubation.  Bilateral lower lobe airspace opacities, mildly increased.  Original Report Authenticated By: Charline Bills, M.D.   Dg Abd Portable 1v  03/16/2011  *RADIOLOGY REPORT*  Clinical Data: Abdominal pain  PORTABLE ABDOMEN - 1 VIEW  Comparison: None.  Findings: An enteric tube terminates in the third portion of the duodenum.  There is a prominent amount of stool in the rectum. Rectal stool measures approximately  7.5 cm transverse diameter.  No significant stool burden is seen within the remainder of the colon. Gas is seen within several upper normal caliber central small bowel loops.  No definite evidence of small bowel obstruction on this single view. Gas is seen scattered within normal caliber colon. The stomach is decompressed.  There are marked degenerative changes with disc space narrowing, endplate sclerosis, and osteophyte formation throughout the lumbar spine.  Heavy atherosclerotic calcification of the aortoiliac vasculature.  IMPRESSION:  1.  Prominent amount of stool in the rectum.  Question fecal impaction. 2.  Small bowel loops are upper normal in caliber, without definite evidence of small bowel obstruction. 3.  Enteric tube (question if this is a nasogastric tube) terminates in the third portion of the duodenum.  Original Report Authenticated By: Britta Mccreedy, M.D.    Medications:  Scheduled:   . albuterol  2.5 mg Nebulization Q6H  . antiseptic oral rinse  1 application Mouth Rinse QID  . aspirin  81 mg Oral Daily  . ceFEPime (MAXIPIME) IV  1 g Intravenous Q8H  . chlorhexidine  15 mL Mouth/Throat BID  . enoxaparin  40 mg Subcutaneous Q24H  . furosemide  20 mg Intravenous Q12H  . insulin aspart  0-9 Units Subcutaneous Q4H  . ipratropium  0.5 mg Nebulization Q6H  . levofloxacin (LEVAQUIN) IV  750 mg Intravenous Q24H  . metoprolol tartrate  12.5 mg Oral BID  . milk and molasses   Rectal Once  . pantoprazole (PROTONIX) IV  40 mg Intravenous Q24H  . potassium chloride  10 mEq Intravenous Q1 Hr x 4  . predniSONE  20 mg Oral Q breakfast  . propofol      . sodium chloride  10 mL Intracatheter Q12H  . vancomycin  1,000 mg Intravenous Q12H  . DISCONTD: pantoprazole sodium  40 mg Per Tube Q1200   Continuous:   . dextrose 5 % and 0.9 % NaCl with KCl 40 mEq/L 70 mL/hr at 03/17/11 0600  . fentaNYL infusion INTRAVENOUS Stopped (03/13/11 0700)  . midazolam (VERSED) infusion Stopped (03/13/11  0700)  . DISCONTD: dextrose 5 % with KCl 20 mEq / L 20 mEq (03/16/11 0900)   ZOX:[id number], metoprolol, ondansetron (ZOFRAN) IV, ondansetron, sodium chloride  Assesment: He has an abnormal respiratory pattern. He does have a history of respiratory failure. He has multiple other medical problems. He is still not back to what I think is his baseline neurologically but none of [place] knew him before he was hospitalized. Workup of all this is underway. Principal Problem:  *Respiratory failure Active Problems:  CAP (community acquired pneumonia)  Toxic encephalopathy  Dehydration  Rhabdomyolysis  Benign hypertension  Hyperlipidemia  GERD (gastroesophageal reflux disease)  Chronic pain  Hypokalemia  Anemia  Elevated troponin I level  Hypernatremia  Hyperglycemia  Elevated LFTs  Non-ST elevation MI (NSTEMI)  Pulmonary edema  Thrombocytosis  Hyponatremia  Fecal impaction    Plan: He will have a blood gas again today and see where he is. I think he may need a PEG tube based on the speech pathologist evaluation  LOS: 14 days   Kiah Vanalstine L 03/17/2011, 8:12 AM

## 2011-03-17 NOTE — Progress Notes (Signed)
Subjective: Interval History: see dictation  Objective: Vital signs in last 24 hours: Temp:  [97.9 F (36.6 C)-100 F (37.8 C)] 98.6 F (37 C) (01/08 0800) Pulse Rate:  [77-108] 87  (01/08 0900) Resp:  [28-44] 35  (01/08 0900) BP: (99-159)/(53-88) 148/66 mmHg (01/08 0900) SpO2:  [5 %-100 %] 98 % (01/08 0900) FiO2 (%):  [60 %] 60 % (01/07 1401)  Intake/Output from previous day: 01/07 0701 - 01/08 0700 In: 3004 [I.V.:1620; NG/GT:120; IV Piggyback:1264] Out: 2350 [Urine:1900; Emesis/NG output:450] Intake/Output this shift: Total I/O In: 80 [I.V.:70; NG/GT:10] Out: -  Nutritional status: NPO   Lab Results:  Basename 03/17/11 0421 03/16/11 0425  WBC 15.0* 15.5*  HGB 9.7* 9.6*  HCT 28.2* 28.0*  PLT 671* 688*  NA 134* 127*  K 3.4* 3.0*  CL 101 92*  CO2 24 26  GLUCOSE 124* 126*  BUN 20 22  CREATININE 0.84 0.86  CALCIUM 9.0 9.2  LABA1C -- --   Lipid Panel No results found for this basename: CHOL,TRIG,HDL,CHOLHDL,VLDL,LDLCALC in the last 72 hours  Studies/Results: Dg Chest Port 1 View  03/15/2011  *RADIOLOGY REPORT*  Clinical Data: Extubated, increasing white blood cell count  PORTABLE CHEST - 1 VIEW  Comparison: 03/13/2011  Findings: Interval extubation.  Bilateral lower lobe airspace opacities, mildly increased. No pleural effusion or pneumothorax.  Cardiomediastinal silhouette is within normal limits.  Enteric tube terminating at the antropyloric region.  Stable right PICC.  IMPRESSION: Interval extubation.  Bilateral lower lobe airspace opacities, mildly increased.  Original Report Authenticated By: Charline Bills, M.D.   Dg Abd Portable 1v  03/16/2011  *RADIOLOGY REPORT*  Clinical Data: Abdominal pain  PORTABLE ABDOMEN - 1 VIEW  Comparison: None.  Findings: An enteric tube terminates in the third portion of the duodenum.  There is a prominent amount of stool in the rectum. Rectal stool measures approximately 7.5 cm transverse diameter.  No significant stool burden is  seen within the remainder of the colon. Gas is seen within several upper normal caliber central small bowel loops.  No definite evidence of small bowel obstruction on this single view. Gas is seen scattered within normal caliber colon. The stomach is decompressed.  There are marked degenerative changes with disc space narrowing, endplate sclerosis, and osteophyte formation throughout the lumbar spine.  Heavy atherosclerotic calcification of the aortoiliac vasculature.  IMPRESSION:  1.  Prominent amount of stool in the rectum.  Question fecal impaction. 2.  Small bowel loops are upper normal in caliber, without definite evidence of small bowel obstruction. 3.  Enteric tube (question if this is a nasogastric tube) terminates in the third portion of the duodenum.  Original Report Authenticated By: Britta Mccreedy, M.D.    Medications:  Scheduled Meds:    . albuterol  2.5 mg Nebulization Q6H  . antiseptic oral rinse  1 application Mouth Rinse QID  . aspirin  81 mg Oral Daily  . ceFEPime (MAXIPIME) IV  1 g Intravenous Q8H  . chlorhexidine  15 mL Mouth/Throat BID  . enoxaparin  40 mg Subcutaneous Q24H  . furosemide  20 mg Intravenous Q12H  . insulin aspart  0-9 Units Subcutaneous Q4H  . ipratropium  0.5 mg Nebulization Q6H  . levofloxacin (LEVAQUIN) IV  750 mg Intravenous Q24H  . metoprolol tartrate  12.5 mg Oral BID  . milk and molasses   Rectal Once  . pantoprazole (PROTONIX) IV  40 mg Intravenous Q24H  . potassium chloride  10 mEq Intravenous Q1 Hr x 4  .  predniSONE  20 mg Oral Q breakfast  . propofol      . sodium chloride  10 mL Intracatheter Q12H  . vancomycin  750 mg Intravenous Q12H  . DISCONTD: vancomycin  1,000 mg Intravenous Q12H   Continuous Infusions:    . dextrose 5 % and 0.9 % NaCl with KCl 40 mEq/L 70 mL/hr at 03/17/11 0800  . fentaNYL infusion INTRAVENOUS Stopped (03/13/11 0700)  . midazolam (VERSED) infusion Stopped (03/13/11 0700)   PRN Meds:.acetaminophen, metoprolol,  ondansetron (ZOFRAN) IV, ondansetron, sodium chloride   Assessment/Plan: see dictation   LOS: 14 days   Brynden Thune

## 2011-03-18 ENCOUNTER — Inpatient Hospital Stay (HOSPITAL_COMMUNITY): Payer: Medicare Other

## 2011-03-18 DIAGNOSIS — R310 Gross hematuria: Secondary | ICD-10-CM | POA: Diagnosis not present

## 2011-03-18 LAB — GLUCOSE, CAPILLARY
Glucose-Capillary: 127 mg/dL — ABNORMAL HIGH (ref 70–99)
Glucose-Capillary: 132 mg/dL — ABNORMAL HIGH (ref 70–99)
Glucose-Capillary: 139 mg/dL — ABNORMAL HIGH (ref 70–99)

## 2011-03-18 LAB — BASIC METABOLIC PANEL
CO2: 22 mEq/L (ref 19–32)
Chloride: 105 mEq/L (ref 96–112)
Creatinine, Ser: 0.81 mg/dL (ref 0.50–1.35)

## 2011-03-18 LAB — LIPASE, BLOOD: Lipase: 105 U/L — ABNORMAL HIGH (ref 11–59)

## 2011-03-18 MED ORDER — FENTANYL 50 MCG/HR TD PT72
50.0000 ug | MEDICATED_PATCH | TRANSDERMAL | Status: DC
  Administered 2011-03-18 – 2011-03-27 (×4): 50 ug via TRANSDERMAL
  Filled 2011-03-18 (×4): qty 1

## 2011-03-18 MED ORDER — FUROSEMIDE 10 MG/ML IJ SOLN
INTRAMUSCULAR | Status: AC
  Filled 2011-03-18: qty 4

## 2011-03-18 NOTE — Consult Note (Signed)
ANTIBIOTIC CONSULT NOTE   Pharmacy Consult for Vancomycin, Cefepime, Levaquin Indication: pneumonia  No Known Allergies  Patient Measurements: Height: 5\' 4"  (162.6 cm) Weight: 128 lb 12 oz (58.4 kg) IBW/kg (Calculated) : 59.2   Vital Signs: Temp: 99.9 F (37.7 C) (01/09 0400) Temp src: Oral (01/09 0400) BP: 145/66 mmHg (01/09 0600) Pulse Rate: 101  (01/09 0600) Intake/Output from previous day: 01/08 0701 - 01/09 0700 In: 2444 [I.V.:1610; NG/GT:430; IV Piggyback:404] Out: 2600 [Urine:2600] Intake/Output from this shift:    Labs:  Basename 03/18/11 0510 03/17/11 0421 03/16/11 0425  WBC -- 15.0* 15.5*  HGB -- 9.7* 9.6*  PLT -- 671* 688*  LABCREA -- -- --  CREATININE 0.81 0.84 0.86   Estimated Creatinine Clearance: 68.1 ml/min (by C-G formula based on Cr of 0.81).  Basename 03/17/11 0629  VANCOTROUGH 24.7*  VANCOPEAK --  Drue Dun --  GENTTROUGH --  GENTPEAK --  GENTRANDOM --  TOBRATROUGH --  TOBRAPEAK --  TOBRARND --  AMIKACINPEAK --  AMIKACINTROU --  AMIKACIN --    Microbiology: Recent Results (from the past 720 hour(s))  CULTURE, BLOOD (ROUTINE X 2)     Status: Normal   Collection Time   03/03/11  3:05 PM      Component Value Range Status Comment   Specimen Description BLOOD RIGHT ANTECUBITAL   Final    Special Requests     Final    Value: BOTTLES DRAWN AEROBIC AND ANAEROBIC 6CC EACH BOTTLE   Culture NO GROWTH 5 DAYS   Final    Report Status 03/08/2011 FINAL   Final   CULTURE, BLOOD (ROUTINE X 2)     Status: Normal   Collection Time   03/03/11  4:31 PM      Component Value Range Status Comment   Specimen Description BLOOD RIGHT HAND   Final    Special Requests BOTTLES DRAWN AEROBIC ONLY 6CC BOTTLE   Final    Culture NO GROWTH 5 DAYS   Final    Report Status 03/08/2011 FINAL   Final   MRSA PCR SCREENING     Status: Normal   Collection Time   03/03/11  6:35 PM      Component Value Range Status Comment   MRSA by PCR NEGATIVE  NEGATIVE  Final     CULTURE, BLOOD (ROUTINE X 2)     Status: Normal   Collection Time   03/06/11  8:38 AM      Component Value Range Status Comment   Specimen Description BLOOD SITE NOT SPECIFIED DRAWN BY RN   Final    Special Requests BOTTLES DRAWN AEROBIC AND ANAEROBIC 6CC   Final    Culture NO GROWTH 5 DAYS   Final    Report Status 03/11/2011 FINAL   Final   CULTURE, BLOOD (ROUTINE X 2)     Status: Normal   Collection Time   03/06/11  8:56 AM      Component Value Range Status Comment   Specimen Description BLOOD LEFT ARM   Final    Special Requests BOTTLES DRAWN AEROBIC AND ANAEROBIC 6CC   Final    Culture NO GROWTH 5 DAYS   Final    Report Status 03/11/2011 FINAL   Final    Medical History: Past Medical History  Diagnosis Date  . Hypertension   . GERD (gastroesophageal reflux disease)   . Hypercholesteremia   . Stroke   . Anemia 03/06/2011  . Smoker   . Non-ST elevation MI (NSTEMI) 03/07/2011  With PNA and resp failure  . Fecal impaction 03/17/2011  . 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 albuterol  2.5 mg Nebulization Q6H  . antiseptic oral rinse  1 application Mouth Rinse QID  . aspirin  81 mg Oral Daily  . ceFEPime (MAXIPIME) IV  1 g Intravenous Q8H  . chlorhexidine  15 mL Mouth/Throat BID  . enoxaparin  40 mg Subcutaneous Q24H  . furosemide  20 mg Intravenous Q12H  . insulin aspart  0-9 Units Subcutaneous Q4H  . ipratropium  0.5 mg Nebulization Q6H  . levofloxacin (LEVAQUIN) IV  750 mg Intravenous Q24H  . LORazepam  1 mg Intravenous Once  . metoprolol tartrate  12.5 mg Oral BID  . metoprolol tartrate  12.5 mg Oral BID  . pantoprazole (PROTONIX) IV  40 mg Intravenous Q24H  . potassium chloride  30 mEq Oral BID  . predniSONE  20 mg Oral Q breakfast  . sodium chloride  10 mL Intracatheter Q12H  . vancomycin  750 mg Intravenous Q12H  . DISCONTD: metoprolol tartrate  12.5 mg Oral BID  . DISCONTD: vancomycin  1,000 mg Intravenous Q12H    Assessment: Trough above target. Renal fxn OK (clcr > 60) Levaquin and Cefepime added SCr stable  Goal of Therapy:  Vancomycin trough level 15-20 mcg/ml  Plan: Discontinue Vancomycin 1gm iv q12hrs Start Vancomycin 750 mg IV every 12 hours levaquin 750mg  iv q24hrs Cefepime 1gm iv q8hrs Labs per protocol  Valrie Hart A 03/18/2011,8:03 AM

## 2011-03-18 NOTE — Progress Notes (Signed)
Again multiple attempts at reinsertion of NG tube have been unsuccessful. Will notify MD about situation and follow orders

## 2011-03-18 NOTE — Progress Notes (Signed)
eLink Physician-Brief Progress Note Patient Name: Marcus [name] DOB: 03-Apr-1940 MRN: [id number]  Date of Service  03/18/2011   HPI/Events of Note     eICU Interventions  SCds for DVT proph   Intervention Category Intermediate Interventions: Best-practice therapies (e.g. DVT, beta blocker, etc.)  Dove Gresham V. 03/18/2011, 4:36 PM

## 2011-03-18 NOTE — Progress Notes (Signed)
Subjective: Interval History: see dictation  Objective: Vital signs in last 24 hours: Temp:  [98 F (36.7 C)-100.2 F (37.9 C)] 99.9 F (37.7 C) (01/09 0400) Pulse Rate:  [80-105] 101  (01/09 0600) Resp:  [30-45] 45  (01/09 0600) BP: (104-182)/(56-86) 145/66 mmHg (01/09 0600) SpO2:  [93 %-100 %] 97 % (01/09 0834) Weight:  [58.4 kg (128 lb 12 oz)] 58.4 kg (128 lb 12 oz) (01/09 0400)  Intake/Output from previous day: 01/08 0701 - 01/09 0700 In: 2494 [I.V.:1610; NG/GT:430; IV Piggyback:454] Out: 2600 [Urine:2600] Intake/Output this shift: Total I/O In: 140 [I.V.:140] Out: 250 [Urine:250] Nutritional status: NPO   Lab Results:  Basename 03/18/11 0510 03/17/11 0421 03/16/11 0425  WBC -- 15.0* 15.5*  HGB -- 9.7* 9.6*  HCT -- 28.2* 28.0*  PLT -- 671* 688*  NA 137 134* --  K 3.5 3.4* --  CL 105 101 --  CO2 22 24 --  GLUCOSE 129* 124* --  BUN 21 20 --  CREATININE 0.81 0.84 --  CALCIUM 9.1 9.0 --  LABA1C -- -- --   Lipid Panel No results found for this basename: CHOL,TRIG,HDL,CHOLHDL,VLDL,LDLCALC in the last 72 hours  Studies/Results: Mr [name] Wo Contrast  03/17/2011  *RADIOLOGY REPORT*  Clinical Data: 71 year old male with prolonged encephalopathy. Decreased level of consciousness.  MRI HEAD WITHOUT AND WITH CONTRAST  Technique:  Multiplanar, multiecho pulse sequences of the brain and surrounding structures were obtained according to standard protocol without and with intravenous contrast  Contrast: 13mL MULTIHANCE GADOBENATE DIMEGLUMINE 529 MG/ML IV SOLN  Comparison: Head CT 03/10/2011.  [facility] brain MRI 12/11/2009.  Findings: Extensive bilateral occipital lobe encephalomalacia with gliosis has not significantly changed since 2011. Chronic left basal ganglia lacunar infarct with associated chronic blood products. Stable small chronic lacunar infarct in the left thalamus.  Chronic blood products in the right paracentral pons noted without associated  encephalomalacia.  This is new since 2011. Stable chronic lacunar infarct left lateral cerebellum.  Confluent periventricular white matter T2 and FLAIR hyperintensity with scattered additional small white matter lacunar infarcts also is not significantly changed since 2011.  No restricted diffusion to suggest acute infarction.   No new areas of signal abnormality in the brain. Major intracranial vascular flow voids are preserved.  Negative pituitary, cervicomedullary junction and visualized cervical spine.  Normal bone marrow signal. No abnormal enhancement identified.  Stable orbits with postoperative changes to the right globe. Negative scalp soft tissues.  IMPRESSION: 1. No acute intracranial abnormality. 2.  Advanced chronic ischemic disease with mild progression since 2011.  Original Report Authenticated By: Harley Hallmark, M.D.   US Abdomen Complete  03/17/2011  *RADIOLOGY REPORT*  Clinical Data:  Nausea and vomiting.  Elevated lipase  COMPLETE ABDOMINAL ULTRASOUND  Comparison:  None.  Findings:  Gallbladder:  Small gallstones are present in the neck of the gallbladder measuring 4 mm.  Gallbladder sludge is present. Gallbladder wall measures 1.8 mm.  Negative sonographic Murphy's sign.  Common bile duct:  3.4 mm  Liver:  No focal lesion identified.  Within normal limits in parenchymal echogenicity.  IVC:  Appears normal.  Pancreas:  Limited visualization of the pancreas without edema or mass.  Spleen:  6.9 mm  Right Kidney:  11.0 cm.  Negative for obstruction.  Left Kidney:  11.3 cm.  18 x 19 mm left upper pole cyst.  Abdominal aorta:  No aneurysm identified.  IMPRESSION: Cholelithiasis with gallbladder sludge.  No gallbladder wall thickening is present.  Bile ducts are  nondilated.  Original Report Authenticated By: Camelia Phenes, M.D.   Dg Chest Port 1 View  03/18/2011  *RADIOLOGY REPORT*  Clinical Data: Respiratory failure.  PORTABLE CHEST - 1 VIEW  Comparison: 03/15/2011.  Findings: Right upper  extremity PICC appears unchanged.  There is no interval change in pulmonary aeration.  Interval removal of enteric tube.  Bilateral lower lobe airspace opacities are present along with atelectasis.  Cardiomediastinal silhouette is unchanged.  IMPRESSION: No interval change in pulmonary aeration with basilar predominant airspace disease.  Unchanged right upper extremity PICC with removal of enteric tube.  Original Report Authenticated By: Andreas Newport, M.D.   Dg Abd Portable 1v  03/16/2011  *RADIOLOGY REPORT*  Clinical Data: Abdominal pain  PORTABLE ABDOMEN - 1 VIEW  Comparison: None.  Findings: An enteric tube terminates in the third portion of the duodenum.  There is a prominent amount of stool in the rectum. Rectal stool measures approximately 7.5 cm transverse diameter.  No significant stool burden is seen within the remainder of the colon. Gas is seen within several upper normal caliber central small bowel loops.  No definite evidence of small bowel obstruction on this single view. Gas is seen scattered within normal caliber colon. The stomach is decompressed.  There are marked degenerative changes with disc space narrowing, endplate sclerosis, and osteophyte formation throughout the lumbar spine.  Heavy atherosclerotic calcification of the aortoiliac vasculature.  IMPRESSION:  1.  Prominent amount of stool in the rectum.  Question fecal impaction. 2.  Small bowel loops are upper normal in caliber, without definite evidence of small bowel obstruction. 3.  Enteric tube (question if this is a nasogastric tube) terminates in the third portion of the duodenum.  Original Report Authenticated By: Britta Mccreedy, M.D.   Dg Swallowing Func-no Report  03/17/2011  CLINICAL DATA: dysphagia   FLUOROSCOPY FOR SWALLOWING FUNCTION STUDY:  Fluoroscopy was provided for swallowing function study, which was  administered by a speech pathologist.  Final results and recommendations  from this study are contained within the  speech pathology report.      Medications:  Scheduled Meds:    . albuterol  2.5 mg Nebulization Q6H  . antiseptic oral rinse  1 application Mouth Rinse QID  . aspirin  81 mg Oral Daily  . ceFEPime (MAXIPIME) IV  1 g Intravenous Q8H  . chlorhexidine  15 mL Mouth/Throat BID  . enoxaparin  40 mg Subcutaneous Q24H  . fentaNYL  50 mcg Transdermal Q72H  . furosemide  20 mg Intravenous Q12H  . insulin aspart  0-9 Units Subcutaneous Q4H  . ipratropium  0.5 mg Nebulization Q6H  . levofloxacin (LEVAQUIN) IV  750 mg Intravenous Q24H  . LORazepam  1 mg Intravenous Once  . metoprolol tartrate  12.5 mg Oral BID  . metoprolol tartrate  12.5 mg Oral BID  . pantoprazole (PROTONIX) IV  40 mg Intravenous Q24H  . potassium chloride  30 mEq Oral BID  . predniSONE  20 mg Oral Q breakfast  . sodium chloride  10 mL Intracatheter Q12H  . vancomycin  750 mg Intravenous Q12H  . DISCONTD: metoprolol tartrate  12.5 mg Oral BID   Continuous Infusions:    . dextrose 5 % and 0.9 % NaCl with KCl 40 mEq/L 70 mL (03/18/11 0800)  . feeding supplement (JEVITY 1.2) 1,000 mL (03/17/11 1105)  . DISCONTD: fentaNYL infusion INTRAVENOUS Stopped (03/13/11 0700)  . DISCONTD: midazolam (VERSED) infusion Stopped (03/13/11 0700)   PRN Meds:.acetaminophen, gadobenate dimeglumine, metoprolol, ondansetron (  ZOFRAN) IV, ondansetron, sodium chloride   Assessment/Plan: see dictation   LOS: 15 days   Marcus [name]

## 2011-03-18 NOTE — Progress Notes (Signed)
Subjective: The patient pulled out his NG tube. Nursing has been unable to successfully reinserted. The patient is currently sitting up in bed. He attempts to answer questions. I asked him if he was in pain and he did not attempt to answer.    Objective: Vital signs in last 24 hours: Filed Vitals:   03/18/11 0400 03/18/11 0500 03/18/11 0600 03/18/11 0834  BP: 138/72 138/64 145/66   Pulse: 101 103 101   Temp: 99.9 F (37.7 C)     TempSrc: Oral     Resp: 43 44 45   Height:      Weight: 58.4 kg (128 lb 12 oz)     SpO2: 98% 97% 95% 97%    Intake/Output Summary (Last 24 hours) at 03/18/11 0927 Last data filed at 03/18/11 0900  Gross per 24 hour  Intake   2484 ml  Output   2850 ml  Net   -366 ml    Weight change:   Physical exam: Lungs: Few bilateral crackles. His breathing rate is rapid. Heart: S1, S2, with a soft systolic murmur. Abdomen: Hypoactive bowel sounds, mildly to moderately tender in the epigastrium, no masses, no rigidity, no guarding. Extremities: No pedal edema. Neurologic: He is alert. He gives eye contact. He nods to questions. I asked him how many fingers that I had up. He did softly say 1 which was correct.  Lab Results: Basic Metabolic Panel:  Basename 03/18/11 0510 03/17/11 0421 03/16/11 0425  NA 137 134* --  K 3.5 3.4* --  CL 105 101 --  CO2 22 24 --  GLUCOSE 129* 124* --  BUN 21 20 --  CREATININE 0.81 0.84 --  CALCIUM 9.1 9.0 --  MG -- -- 1.9  PHOS -- -- --   Liver Function Tests:  Basename 03/17/11 0421 03/16/11 0921  AST 14 17  ALT 16 18  ALKPHOS 58 53  BILITOT 0.5 0.4  PROT 6.4 6.6  ALBUMIN 2.3* 2.4*    Basename 03/18/11 0510 03/17/11 0748  LIPASE 105* 162*  AMYLASE -- --   No results found for this basename: AMMONIA:2 in the last 72 hours CBC:  Basename 03/17/11 0421 03/16/11 0425  WBC 15.0* 15.5*  NEUTROABS -- --  HGB 9.7* 9.6*  HCT 28.2* 28.0*  MCV 92.2 92.4  PLT 671* 688*   Cardiac Enzymes:  Basename 03/16/11  0425  CKTOTAL 88  CKMB 3.0  CKMBINDEX --  TROPONINI <0.30   BNP:  Basename 03/15/11 2204  PROBNP 1621.0*   D-Dimer: No results found for this basename: DDIMER:2 in the last 72 hours CBG:  Basename 03/18/11 0751 03/18/11 0429 03/17/11 2341 03/17/11 1954 03/17/11 1658 03/17/11 1236  GLUCAP 132* 127* 107* 133* 101* 152*   Hemoglobin A1C: No results found for this basename: HGBA1C in the last 72 hours Fasting Lipid Panel: No results found for this basename: CHOL,HDL,LDLCALC,TRIG,CHOLHDL,LDLDIRECT in the last 72 hours Thyroid Function Tests: No results found for this basename: TSH,T4TOTAL,FREET4,T3FREE,THYROIDAB in the last 72 hours Anemia Panel: No results found for this basename: VITAMINB12,FOLATE,FERRITIN,TIBC,IRON,RETICCTPCT in the last 72 hours Coagulation: No results found for this basename: LABPROT:2,INR:2 in the last 72 hours Urine Drug Screen: Drugs of Abuse  No results found for this basename: labopia,  cocainscrnur,  labbenz,  amphetmu,  thcu,  labbarb    Alcohol Level: No results found for this basename: ETH:2 in the last 72 hours   Micro: No results found for this or any previous visit (from the past 240 hour(s)).  Studies/Results:  Mr [name] Wo Contrast  03/17/2011  *RADIOLOGY REPORT*  Clinical Data: 71 year old male with prolonged encephalopathy. Decreased level of consciousness.  MRI HEAD WITHOUT AND WITH CONTRAST  Technique:  Multiplanar, multiecho pulse sequences of the brain and surrounding structures were obtained according to standard protocol without and with intravenous contrast  Contrast: 13mL MULTIHANCE GADOBENATE DIMEGLUMINE 529 MG/ML IV SOLN  Comparison: Head CT 03/10/2011.  [facility] brain MRI 12/11/2009.  Findings: Extensive bilateral occipital lobe encephalomalacia with gliosis has not significantly changed since 2011. Chronic left basal ganglia lacunar infarct with associated chronic blood products. Stable small chronic lacunar infarct in the  left thalamus.  Chronic blood products in the right paracentral pons noted without associated encephalomalacia.  This is new since 2011. Stable chronic lacunar infarct left lateral cerebellum.  Confluent periventricular white matter T2 and FLAIR hyperintensity with scattered additional small white matter lacunar infarcts also is not significantly changed since 2011.  No restricted diffusion to suggest acute infarction.   No new areas of signal abnormality in the brain. Major intracranial vascular flow voids are preserved.  Negative pituitary, cervicomedullary junction and visualized cervical spine.  Normal bone marrow signal. No abnormal enhancement identified.  Stable orbits with postoperative changes to the right globe. Negative scalp soft tissues.  IMPRESSION: 1. No acute intracranial abnormality. 2.  Advanced chronic ischemic disease with mild progression since 2011.  Original Report Authenticated By: Harley Hallmark, M.D.   US Abdomen Complete  03/17/2011  *RADIOLOGY REPORT*  Clinical Data:  Nausea and vomiting.  Elevated lipase  COMPLETE ABDOMINAL ULTRASOUND  Comparison:  None.  Findings:  Gallbladder:  Small gallstones are present in the neck of the gallbladder measuring 4 mm.  Gallbladder sludge is present. Gallbladder wall measures 1.8 mm.  Negative sonographic Murphy's sign.  Common bile duct:  3.4 mm  Liver:  No focal lesion identified.  Within normal limits in parenchymal echogenicity.  IVC:  Appears normal.  Pancreas:  Limited visualization of the pancreas without edema or mass.  Spleen:  6.9 mm  Right Kidney:  11.0 cm.  Negative for obstruction.  Left Kidney:  11.3 cm.  18 x 19 mm left upper pole cyst.  Abdominal aorta:  No aneurysm identified.  IMPRESSION: Cholelithiasis with gallbladder sludge.  No gallbladder wall thickening is present.  Bile ducts are nondilated.  Original Report Authenticated By: Camelia Phenes, M.D.   Dg Chest Port 1 View  03/18/2011  *RADIOLOGY REPORT*  Clinical Data:  Respiratory failure.  PORTABLE CHEST - 1 VIEW  Comparison: 03/15/2011.  Findings: Right upper extremity PICC appears unchanged.  There is no interval change in pulmonary aeration.  Interval removal of enteric tube.  Bilateral lower lobe airspace opacities are present along with atelectasis.  Cardiomediastinal silhouette is unchanged.  IMPRESSION: No interval change in pulmonary aeration with basilar predominant airspace disease.  Unchanged right upper extremity PICC with removal of enteric tube.  Original Report Authenticated By: Andreas Newport, M.D.   Dg Abd Portable 1v  03/16/2011  *RADIOLOGY REPORT*  Clinical Data: Abdominal pain  PORTABLE ABDOMEN - 1 VIEW  Comparison: None.  Findings: An enteric tube terminates in the third portion of the duodenum.  There is a prominent amount of stool in the rectum. Rectal stool measures approximately 7.5 cm transverse diameter.  No significant stool burden is seen within the remainder of the colon. Gas is seen within several upper normal caliber central small bowel loops.  No definite evidence of small bowel obstruction on this single view.  Gas is seen scattered within normal caliber colon. The stomach is decompressed.  There are marked degenerative changes with disc space narrowing, endplate sclerosis, and osteophyte formation throughout the lumbar spine.  Heavy atherosclerotic calcification of the aortoiliac vasculature.  IMPRESSION:  1.  Prominent amount of stool in the rectum.  Question fecal impaction. 2.  Small bowel loops are upper normal in caliber, without definite evidence of small bowel obstruction. 3.  Enteric tube (question if this is a nasogastric tube) terminates in the third portion of the duodenum.  Original Report Authenticated By: Britta Mccreedy, M.D.   Dg Swallowing Func-no Report  03/17/2011  CLINICAL DATA: dysphagia   FLUOROSCOPY FOR SWALLOWING FUNCTION STUDY:  Fluoroscopy was provided for swallowing function study, which was  administered by a speech  pathologist.  Final results and recommendations  from this study are contained within the speech pathology report.      Medications: I have reviewed the patient's current medications.  Assessment: Principal Problem:  *Respiratory failure Active Problems:  CAP (community acquired pneumonia)  Toxic encephalopathy  Dehydration  Rhabdomyolysis  Benign hypertension  Hyperlipidemia  GERD (gastroesophageal reflux disease)  Chronic pain  Hypokalemia  Anemia  Elevated troponin I level  Hypernatremia  Hyperglycemia  Elevated LFTs  Non-ST elevation MI (NSTEMI)  Pulmonary edema  Thrombocytosis  Hyponatremia  Fecal impaction  Cortical blindness  Gross hematuria   Acute respiratory failure secondary to community-acquired pneumonia. Query superimposed pulmonary edema. His followup chest x-rays revealed persistent bibasilar opacities. . His urinalysis still has WBCs. He is status post extubation. He may have aspirated with the NG tube feedings. He is now oxygenating 98% on 2-3 L. He is now on Levaquin, cefepime, and vancomycin. Steroids have been tapered down to 20 mg daily. He is on Lasix 20 mg IV every 12 hours. He continues on Atrovent and albuterol nebulizations.  Tachypnea. He has respiratory alkalosis. It is unclear if his rapid breathing rate is centrally mediated. It could be secondary to discomfort. He does have a history of chronic pain syndrome and has been off of his head no patch. This will be restarted to see if it improves his breathing rate.  Nausea and vomiting, secondary to fecal impaction versus questionable acute pancreatitis, per the results of the KUB and lipase. His LFTs are within normal limits. His lipase is elevated although I'm not convinced that he has acute pancreatitis. He was given an enema  with good results. Ultrasound of his abdomen on January 8 revealed cholelithiasis and gallbladder sludge but no gallbladder wall thickening. Will retry tube feedings were  restarted with success, however, the patient pulled out his NG tube.  Hyponatremia and hypokalemia. Resolving with appropriate change in IV fluids and potassium chloride runs.  Toxic Metabolic encephalopathy. Overall, he appears to be responding a little better each day.. Per her neurologist Dr. 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 However, the patient appears to give good eye contact and he appeared to have seen my 1 finger held up when asked him how many fingers I had up. 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  His EEG results were unimpressive.   Dysphagia. The speech therapist evaluation is noted for severe dysphagia, per MBSS. I will discuss PEG tube placement with his daughter as I do not have confidence that the patient will be able to acquire enough nutrition orally. We'll ask the  nursing staff to attempt to replace the NG tube to restart tube feedings.  Plan:  1. Will restart fentanyl patch at 50 mcg instead of his usual dose of 100 mcg, with possible titration. We'll continue to monitor his respiratory rate. We'll continue narrowing antibiotics over the next day or 2 as he has been afebrile and his blood cultures have been negative to date. Will attempt to contact his daughter to discuss PEG tube placement which think will eventually be needed.   LOS: 15 days   Marcus [name] 03/18/2011, 9:27 AM

## 2011-03-18 NOTE — Progress Notes (Signed)
UR Chart Review Completed  

## 2011-03-18 NOTE — Progress Notes (Signed)
CSW spoke with pt's daughter Boyd Kerbs regarding d/c plan.  PT is recommending SNF.  CSW discussed placement process and daughter agrees this is necessary at d/c.  She requests [facility] or [facility] facility as [facility] does not accept [facility] [facility].  Boyd Kerbs reports pt has Medicaid and will call CSW with this information as it is not in EPIC.  CSW will fax out FL2 and follow up with bed offers when available.  Daughter said prior to admission pt's needs were managed well at home.    Karn Cassis

## 2011-03-18 NOTE — Progress Notes (Signed)
Upon entering room noticed that patient had pulled off oxygen and pulled out NG tube.  Tube feeding turned to off.  Multiple attempts at reinsertion of NG tube were not successful by two nurses.  MD notified and contacted for order for something to combat patient's anxiety in order to help him relax and aid in another insertion attempt.

## 2011-03-18 NOTE — Progress Notes (Signed)
Physical Therapy Treatment Patient Details Name: Marcus [name] MRN: [id number] DOB: 1940/10/16 Today's Date: 03/18/2011 Time: 4:35-4:56 Charge therapeutic activities 20 min PT Assessment/Plan  PT - Assessment/Plan Comments on Treatment Session: Pt unable to verbal communicate.  Pt did nod that he would agree to participate in activities and that he understood the instructions to complete task.   PT Goals     PT Treatment Precautions/Restrictions  Precautions Precautions: Fall Required Braces or Orthoses: No Restrictions Weight Bearing Restrictions: No Mobility (including Balance) Bed Mobility Bed Mobility: No (pt already sitting in chair no bed mobility held this sessio)    Exercise  Total Joint Exercises Ankle Circles/Pumps: AAROM;Both;10 reps;Seated Quad Sets: AAROM;10 reps;Seated Heel Slides: PROM;10 reps;Seated End of Session PT - End of Session Activity Tolerance: Other (comment) (unable to tell, flat affect did agree by a nod to complete ) Patient left: in chair;with call bell in reach General Behavior During Session: Flat affect Cognition: Impaired  Juel Burrow 03/18/2011, 4:56 PM

## 2011-03-18 NOTE — Progress Notes (Signed)
Subjective: He is overall about the same. He continues to make some improvement neurologically and is more alert. His breathing pattern is a little bit better.  Objective: Vital signs in last 24 hours: Temp:  [98 F (36.7 C)-100.2 F (37.9 C)] 99.9 F (37.7 C) (01/09 0400) Pulse Rate:  [80-105] 101  (01/09 0600) Resp:  [30-45] 45  (01/09 0600) BP: (104-182)/(56-86) 145/66 mmHg (01/09 0600) SpO2:  [93 %-100 %] 95 % (01/09 0600) Weight:  [58.4 kg (128 lb 12 oz)] 58.4 kg (128 lb 12 oz) (01/09 0400) Weight change:  Last BM Date: 03/17/11  Intake/Output from previous day: 01/08 0701 - 01/09 0700 In: 2444 [I.V.:1610; NG/GT:430; IV Piggyback:404] Out: 2600 [Urine:2600]  PHYSICAL EXAM General appearance: mild distress and More alert but still not normal Resp: rhonchi bilaterally Cardio: regular rate and rhythm, S1, S2 normal, no murmur, click, rub or gallop GI: soft, non-tender; bowel sounds normal; no masses,  no organomegaly Extremities: extremities normal, atraumatic, no cyanosis or edema  Lab Results:    Basic Metabolic Panel:  Basename 03/18/11 0510 03/17/11 0421 03/16/11 0425  NA 137 134* --  K 3.5 3.4* --  CL 105 101 --  CO2 22 24 --  GLUCOSE 129* 124* --  BUN 21 20 --  CREATININE 0.81 0.84 --  CALCIUM 9.1 9.0 --  MG -- -- 1.9  PHOS -- -- --   Liver Function Tests:  Basename 03/17/11 0421 03/16/11 0921  AST 14 17  ALT 16 18  ALKPHOS 58 53  BILITOT 0.5 0.4  PROT 6.4 6.6  ALBUMIN 2.3* 2.4*    Basename 03/18/11 0510 03/17/11 0748  LIPASE 105* 162*  AMYLASE -- --   No results found for this basename: AMMONIA:2 in the last 72 hours CBC:  Basename 03/17/11 0421 03/16/11 0425  WBC 15.0* 15.5*  NEUTROABS -- --  HGB 9.7* 9.6*  HCT 28.2* 28.0*  MCV 92.2 92.4  PLT 671* 688*   Cardiac Enzymes:  Basename 03/16/11 0425  CKTOTAL 88  CKMB 3.0  CKMBINDEX --  TROPONINI <0.30   BNP:  Basename 03/15/11 2204  PROBNP 1621.0*   D-Dimer: No results found  for this basename: DDIMER:2 in the last 72 hours CBG:  Basename 03/18/11 0429 03/17/11 2341 03/17/11 1954 03/17/11 1658 03/17/11 1236 03/17/11 0750  GLUCAP 127* 107* 133* 101* 152* 130*   Hemoglobin A1C: No results found for this basename: HGBA1C in the last 72 hours Fasting Lipid Panel: No results found for this basename: CHOL,HDL,LDLCALC,TRIG,CHOLHDL,LDLDIRECT in the last 72 hours Thyroid Function Tests: No results found for this basename: TSH,T4TOTAL,FREET4,T3FREE,THYROIDAB in the last 72 hours Anemia Panel: No results found for this basename: VITAMINB12,FOLATE,FERRITIN,TIBC,IRON,RETICCTPCT in the last 72 hours Coagulation: No results found for this basename: LABPROT:2,INR:2 in the last 72 hours Urine Drug Screen: Drugs of Abuse  No results found for this basename: labopia, cocainscrnur, labbenz, amphetmu, thcu, labbarb    Alcohol Level: No results found for this basename: ETH:2 in the last 72 hours Urinalysis:  Misc. Labs:  ABGS  Basename 03/17/11 1045  PHART 7.531*  PO2ART 56.8*  TCO2 17.0  HCO3 19.5*   CULTURES No results found for this or any previous visit (from the past 240 hour(s)). Studies/Results: Mr [name] ZO Contrast  03/17/2011  *RADIOLOGY REPORT*  Clinical Data: 71 year old male with prolonged encephalopathy. Decreased level of consciousness.  MRI HEAD WITHOUT AND WITH CONTRAST  Technique:  Multiplanar, multiecho pulse sequences of the brain and surrounding structures were obtained according to standard protocol  without and with intravenous contrast  Contrast: 13mL MULTIHANCE GADOBENATE DIMEGLUMINE 529 MG/ML IV SOLN  Comparison: Head CT 03/10/2011.  [facility] brain MRI 12/11/2009.  Findings: Extensive bilateral occipital lobe encephalomalacia with gliosis has not significantly changed since 2011. Chronic left basal ganglia lacunar infarct with associated chronic blood products. Stable small chronic lacunar infarct in the left thalamus.  Chronic blood  products in the right paracentral pons noted without associated encephalomalacia.  This is new since 2011. Stable chronic lacunar infarct left lateral cerebellum.  Confluent periventricular white matter T2 and FLAIR hyperintensity with scattered additional small white matter lacunar infarcts also is not significantly changed since 2011.  No restricted diffusion to suggest acute infarction.   No new areas of signal abnormality in the brain. Major intracranial vascular flow voids are preserved.  Negative pituitary, cervicomedullary junction and visualized cervical spine.  Normal bone marrow signal. No abnormal enhancement identified.  Stable orbits with postoperative changes to the right globe. Negative scalp soft tissues.  IMPRESSION: 1. No acute intracranial abnormality. 2.  Advanced chronic ischemic disease with mild progression since 2011.  Original Report Authenticated By: Harley Hallmark, M.D.   US Abdomen Complete  03/17/2011  *RADIOLOGY REPORT*  Clinical Data:  Nausea and vomiting.  Elevated lipase  COMPLETE ABDOMINAL ULTRASOUND  Comparison:  None.  Findings:  Gallbladder:  Small gallstones are present in the neck of the gallbladder measuring 4 mm.  Gallbladder sludge is present. Gallbladder wall measures 1.8 mm.  Negative sonographic Murphy's sign.  Common bile duct:  3.4 mm  Liver:  No focal lesion identified.  Within normal limits in parenchymal echogenicity.  IVC:  Appears normal.  Pancreas:  Limited visualization of the pancreas without edema or mass.  Spleen:  6.9 mm  Right Kidney:  11.0 cm.  Negative for obstruction.  Left Kidney:  11.3 cm.  18 x 19 mm left upper pole cyst.  Abdominal aorta:  No aneurysm identified.  IMPRESSION: Cholelithiasis with gallbladder sludge.  No gallbladder wall thickening is present.  Bile ducts are nondilated.  Original Report Authenticated By: Camelia Phenes, M.D.   Dg Chest Port 1 View  03/18/2011  *RADIOLOGY REPORT*  Clinical Data: Respiratory failure.  PORTABLE CHEST  - 1 VIEW  Comparison: 03/15/2011.  Findings: Right upper extremity PICC appears unchanged.  There is no interval change in pulmonary aeration.  Interval removal of enteric tube.  Bilateral lower lobe airspace opacities are present along with atelectasis.  Cardiomediastinal silhouette is unchanged.  IMPRESSION: No interval change in pulmonary aeration with basilar predominant airspace disease.  Unchanged right upper extremity PICC with removal of enteric tube.  Original Report Authenticated By: Andreas Newport, M.D.   Dg Abd Portable 1v  03/16/2011  *RADIOLOGY REPORT*  Clinical Data: Abdominal pain  PORTABLE ABDOMEN - 1 VIEW  Comparison: None.  Findings: An enteric tube terminates in the third portion of the duodenum.  There is a prominent amount of stool in the rectum. Rectal stool measures approximately 7.5 cm transverse diameter.  No significant stool burden is seen within the remainder of the colon. Gas is seen within several upper normal caliber central small bowel loops.  No definite evidence of small bowel obstruction on this single view. Gas is seen scattered within normal caliber colon. The stomach is decompressed.  There are marked degenerative changes with disc space narrowing, endplate sclerosis, and osteophyte formation throughout the lumbar spine.  Heavy atherosclerotic calcification of the aortoiliac vasculature.  IMPRESSION:  1.  Prominent amount of stool  in the rectum.  Question fecal impaction. 2.  Small bowel loops are upper normal in caliber, without definite evidence of small bowel obstruction. 3.  Enteric tube (question if this is a nasogastric tube) terminates in the third portion of the duodenum.  Original Report Authenticated By: Britta Mccreedy, M.D.   Dg Swallowing Func-no Report  03/17/2011  CLINICAL DATA: dysphagia   FLUOROSCOPY FOR SWALLOWING FUNCTION STUDY:  Fluoroscopy was provided for swallowing function study, which was  administered by a speech pathologist.  Final results and  recommendations  from this study are contained within the speech pathology report.      Medications:  Scheduled:   . albuterol  2.5 mg Nebulization Q6H  . antiseptic oral rinse  1 application Mouth Rinse QID  . aspirin  81 mg Oral Daily  . ceFEPime (MAXIPIME) IV  1 g Intravenous Q8H  . chlorhexidine  15 mL Mouth/Throat BID  . enoxaparin  40 mg Subcutaneous Q24H  . furosemide  20 mg Intravenous Q12H  . insulin aspart  0-9 Units Subcutaneous Q4H  . ipratropium  0.5 mg Nebulization Q6H  . levofloxacin (LEVAQUIN) IV  750 mg Intravenous Q24H  . LORazepam  1 mg Intravenous Once  . metoprolol tartrate  12.5 mg Oral BID  . metoprolol tartrate  12.5 mg Oral BID  . pantoprazole (PROTONIX) IV  40 mg Intravenous Q24H  . potassium chloride  30 mEq Oral BID  . predniSONE  20 mg Oral Q breakfast  . sodium chloride  10 mL Intracatheter Q12H  . vancomycin  750 mg Intravenous Q12H  . DISCONTD: metoprolol tartrate  12.5 mg Oral BID  . DISCONTD: vancomycin  1,000 mg Intravenous Q12H   Continuous:   . dextrose 5 % and 0.9 % NaCl with KCl 40 mEq/L 70 mL/hr at 03/18/11 0600  . feeding supplement (JEVITY 1.2) 1,000 mL (03/17/11 1105)  . fentaNYL infusion INTRAVENOUS Stopped (03/13/11 0700)  . midazolam (VERSED) infusion Stopped (03/13/11 0700)   GEX:[id number], gadobenate dimeglumine, metoprolol, ondansetron (ZOFRAN) IV, ondansetron, sodium chloride  Assesment: He had respiratory failure and seems to be improving. He still has a rapid respiratory rate is a little bit better. He is improving neurologically. His MRI did not show a definite stroke. His ultrasound of the abdomen did not show definite pancreatitis. He has had speech evaluation is felt to be at significant risk for aspiration which I think is correct my plan then would be to continue with his medications he may be appropriate for a rehabilitation placement when he is ready for discharge. Principal Problem:  *Respiratory failure Active  Problems:  CAP (community acquired pneumonia)  Toxic encephalopathy  Dehydration  Rhabdomyolysis  Benign hypertension  Hyperlipidemia  GERD (gastroesophageal reflux disease)  Chronic pain  Hypokalemia  Anemia  Elevated troponin I level  Hypernatremia  Hyperglycemia  Elevated LFTs  Non-ST elevation MI (NSTEMI)  Pulmonary edema  Thrombocytosis  Hyponatremia  Fecal impaction  Cortical blindness    Plan: I would continue with his current treatments continue tapering his steroids are probably get him to by mouth and not change anything else he does seem to be improving gradually    LOS: 15 days   Oree Hislop L 03/18/2011, 8:10 AM

## 2011-03-18 NOTE — Progress Notes (Signed)
Spoke with patient's daughter, Feliberto Gottron, regarding the need to place a PEG tube to provide nutrition. She stated she "told the doctor that she wanted him to get nutrition by any means possible." She said she would agree to PEG placement.

## 2011-03-19 ENCOUNTER — Inpatient Hospital Stay (HOSPITAL_COMMUNITY): Payer: Medicare Other

## 2011-03-19 LAB — BASIC METABOLIC PANEL
BUN: 19 mg/dL (ref 6–23)
Chloride: 108 mEq/L (ref 96–112)
GFR calc Af Amer: >90 mL/min (ref >90)
Glucose, Bld: 174 mg/dL — ABNORMAL HIGH (ref 70–99)
Potassium: 3.1 mEq/L — ABNORMAL LOW (ref 3.5–5.1)
Sodium: 139 mEq/L (ref 135–145)

## 2011-03-19 LAB — GLUCOSE, CAPILLARY
Glucose-Capillary: 128 mg/dL — ABNORMAL HIGH (ref 70–99)
Glucose-Capillary: 138 mg/dL — ABNORMAL HIGH (ref 70–99)
Glucose-Capillary: 140 mg/dL — ABNORMAL HIGH (ref 70–99)
Glucose-Capillary: 179 mg/dL — ABNORMAL HIGH (ref 70–99)

## 2011-03-19 MED ORDER — FUROSEMIDE 10 MG/ML IJ SOLN
20.0000 mg | Freq: Every day | INTRAMUSCULAR | Status: DC
  Administered 2011-03-19: 20 mg via INTRAVENOUS
  Filled 2011-03-19: qty 2

## 2011-03-19 MED ORDER — KCL IN DEXTROSE-NACL 40-5-0.9 MEQ/L-%-% IV SOLN
INTRAVENOUS | Status: DC
  Administered 2011-03-19: 19:00:00 via INTRAVENOUS
  Filled 2011-03-19 (×8): qty 1000

## 2011-03-19 MED ORDER — POTASSIUM CHLORIDE 10 MEQ/100ML IV SOLN
10.0000 meq | INTRAVENOUS | Status: AC
  Administered 2011-03-19 (×4): 10 meq via INTRAVENOUS
  Filled 2011-03-19 (×2): qty 400

## 2011-03-19 MED ORDER — HYDROCORTISONE SOD SUCCINATE 100 MG IJ SOLR
25.0000 mg | Freq: Every day | INTRAMUSCULAR | Status: DC
  Administered 2011-03-19: 25 mg via INTRAVENOUS
  Filled 2011-03-19: qty 2

## 2011-03-19 NOTE — Progress Notes (Signed)
PT UP VIA HOYER LFT TO RECLINER. TOLERATED TRANSFER VERY WELL. ANSWERING ALL YES AND NO QUESTIONS CORRECTLY. REMAINED UP FOR THEN BECAME SOB.RESP > TO 40'S. O2 SAT < TO 70'S. PT PUT BACK TO BED AND RESPIRATORY STATUS STABILIZED. PT ACKNOWLEDGES THAT HE IS NO LONGER IN ANY DISTRESS.

## 2011-03-19 NOTE — Progress Notes (Signed)
NAMESYON, TEWS              ACCOUNT NO.:  [id number]  MEDICAL RECORD NO.:  [id number]  LOCATION:                                 FACILITY:  PHYSICIAN:  Jayliani Wanner A. Gerilyn Pilgrim, M.D. DATE OF BIRTH:  1941-01-14  DATE OF PROCEDURE: DATE OF DISCHARGE:                                PROGRESS NOTE   HISTORY OF PRESENT ILLNESS:  The patient seems to be a lot more tachypneic today.  He has issues with swallowing.  The NG tube is out. He has failed his swallowing studies.  Today, he is awake and alert.  He does focus and interact, but he is overall less responsive.  Again, he appears tachypneic, does not follow commands that he did yesterday.  I suspect this patient is more tachypneic.  Brain stem reflexes are intact including corneal and orthostatic reflexes.  Tracing was demonstrated and symmetric.  Again, he does not follow commands, though he moves both sides with painful stimuli.  MRI of the brain shows old findings with old bilateral large cortical infarcts and increased periventricular and deep white matter with encephalopathy, nothing acute.  ASSESSMENT AND PLAN:  Cephalopathy, multifactorial.  Most of these are from toxic metabolic etiologies.  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  Given the persistent problems with swallowing, a the PEG should be considered, and I will actually go ahead and recommend that.  Otherwise, continue with the current care.     Junella Domke A. Gerilyn Pilgrim, M.D.     KAD/MEDQ  D:  03/18/2011  T:  03/18/2011  Job:  [id number]

## 2011-03-19 NOTE — Progress Notes (Signed)
PT TRANSFERRED TO TELEMETRY BED IN ROOM 306.PT REMAINS  ON O2 AT 4L/MIN VIA [place]. ALERT AND ORIENTED TO PERSON AND PLACE.NO APPARENT RESP DISTRESS. CONTINUES TO ANSWER YES AND NO QUESTIONS APPROPRIATELY. FOLEY CATHETER PATENT DRAINING RED TINGED URINE. NO BROKEN SKIN AREAS. BILATERAL ELBOW PADS IN PLACE. RT UPPER ARM PICC LINE PATENT AND INFUSING W/O DIFFICULTY. TRANSFER REPORT GIVEN TO MIRANDA SURULES RN.

## 2011-03-19 NOTE — Progress Notes (Signed)
Marcus [name], Marcus [name]              ACCOUNT NO.:  [id number]  MEDICAL RECORD NO.:  [id number]  LOCATION:                                 FACILITY:  PHYSICIAN:  Fate Galanti A. Gerilyn Pilgrim, M.D. DATE OF BIRTH:  September 07, 1940  DATE OF PROCEDURE: DATE OF DISCHARGE:                                PROGRESS NOTE   The patient has improved, but overall is about the same.  He is intubated.  He is awake and alert.  More responsive.  Neck is supple. Head is normocephalic, atraumatic.  Abdomen is soft.  He focuses on tracks, looks around, and tries to follow commands.  He does this with much prompting intermittently, but not on a consistent basis, midline with appendicular.  The patient's brainstem reflexes again are intact. He has antigravity strength, upper extremities.  ASSESSMENT AND PLAN:  Encephalopathy and has had no toxic metabolic processes.  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  Dr. Sherrie Mustache has ordered MRI, we will check this results.     Marcus [name] A. Gerilyn Pilgrim, M.D.     KAD/MEDQ  D:  03/17/2011  T:  03/18/2011  Job:  [id number]

## 2011-03-19 NOTE — Progress Notes (Signed)
Physical Therapy Treatment Patient Details Name: Marcus [name] MRN: [id number] DOB: May 07, 1940 Today's Date: 03/19/2011  PT Assessment/Plan  PT - Assessment/Plan Comments on Treatment Session: continues to not verbalize but we are beginning to see  some voluntary muscle  motion is both UE's, L hip and knee..we are using PNF diagonals to facilitate rolling as well as sitting unsupported at EOB for developing  funciotnal weight bearing PT Plan: Discharge plan remains appropriate;Frequency remains appropriate PT Goals  Acute Rehab PT Goals Pt will Roll Supine to Right Side: with mod assist PT Goal: Rolling Supine to Right Side - Progress: Revised (modified due to lack of progress/goal met) PT Goal: Sit at [facility] Of Bed - Progress: Progressing toward goal  PT Treatment Precautions/Restrictions  Precautions Precautions: Fall Required Braces or Orthoses: No Restrictions Weight Bearing Restrictions: No Mobility (including Balance) Bed Mobility Rolling Right: 2: Max assist Rolling Right Details (indicate cue type and reason): Pt now able to sue PHF diagonal with LUE to facilitate rollin R and can lift head and L shoulder to initiate roll Rolling Left: 1: +1 Total assist Supine to Sit: 1: +1 Total assist Supine to Sit Details (indicate cue type and reason): pt does pull forward with abdominals to lift head and trunk up off of bed as if to sit up...pt sat at EOB with max assist with manual/verbal cues to lean L...sat for approx. 3 min Sit to Supine: 1: +1 Total assist Transfers Transfers: No Ambulation/Gait Ambulation/Gait: No Stairs: No Wheelchair Mobility Wheelchair Mobility: No    Exercise  General Exercises - Upper Extremity Shoulder Flexion: PROM;AAROM;Right;Left;5 reps;Supine Shoulder Horizontal ABduction: PROM;AAROM;Right;Left;5 reps;Supine Elbow Flexion: AAROM;Right;Left;5 reps;Supine Elbow Extension: AAROM;Both;5 reps;Supine Wrist Flexion: AAROM;Both;5 reps;Supine Wrist  Extension: AAROM;Both;5 reps;Supine General Exercises - Lower Extremity Ankle Circles/Pumps: PROM;Both;5 reps;Supine Heel Slides: AAROM;Both;5 reps;Supine Hip ABduction/ADduction: AAROM;Both;5 reps;Supine End of Session PT - End of Session Activity Tolerance: Patient tolerated treatment well;Patient limited by fatigue Patient left: in bed;with call bell in reach;with bed alarm set Nurse Communication: Mobility status for transfers General Behavior During Session: [facility] for tasks performed Cognition: [facility] for tasks performed  Konrad Penta 03/19/2011, 10:11 AM

## 2011-03-19 NOTE — Progress Notes (Signed)
03/19/11 1829 patient seems lethargic, arousable and able to state name, but goes right back to sleep. T-98.4, HR-95, R-20, BP 133/72, and O2 sat 93% on 3 lpm. Stated "i don't feel good" before going back to sleep. Has been alert and oriented earlier today per ICU nurse. Notified Dr Sherrie Mustache, stated he has been alert and then sleepy at times during stay, will come up to see patient. Nursing to monitor.

## 2011-03-19 NOTE — Progress Notes (Addendum)
Subjective: Patient is sitting up in the bed. He appears much more alert and does follow small commands. No acute problems overnight. Of note, he pulled out his NG a couple days ago and the nursing staff has been unable to reinsert it. He nods no to pain.   Objective: Vital signs in last 24 hours: Filed Vitals:   03/19/11 0700 03/19/11 0707 03/19/11 0800 03/19/11 0900  BP:   133/63 148/75  Pulse: 93  96 96  Temp:   99.2 F (37.3 C)   TempSrc:   Axillary   Resp: 25  30 31   Height:      Weight:      SpO2: 100% 95% 99% 100%    Intake/Output Summary (Last 24 hours) at 03/19/11 1001 Last data filed at 03/19/11 0800  Gross per 24 hour  Intake   1742 ml  Output   2550 ml  Net   -808 ml    Weight change: -1.1 kg (-2 lb 6.8 oz)  Physical exam: Lungs: Few bilateral crackles. His breathing rate is significantly less rapid than it was yesterday. Heart: S1, S2, with a soft systolic murmur. Abdomen: Hypoactive bowel sounds, mildly to moderately tender in the epigastrium, no masses, no rigidity, no guarding. Extremities: No pedal edema. Neurologic: He is alert. He gives eye contact. He nods to questions. I asked him how many fingers that I had up. He did softly say to which was correct.  Lab Results: Basic Metabolic Panel:  Basename 03/19/11 0451 03/18/11 0510  NA 139 137  K 3.1* 3.5  CL 108 105  CO2 21 22  GLUCOSE 174* 129*  BUN 19 21  CREATININE 0.74 0.81  CALCIUM 9.1 9.1  MG -- --  PHOS -- --   Liver Function Tests:  [facility] 03/17/11 0421  AST 14  ALT 16  ALKPHOS 58  BILITOT 0.5  PROT 6.4  ALBUMIN 2.3*    Basename 03/18/11 0510 03/17/11 0748  LIPASE 105* 162*  AMYLASE -- --   No results found for this basename: AMMONIA:2 in the last 72 hours CBC:  Basename 03/17/11 0421  WBC 15.0*  NEUTROABS --  HGB 9.7*  HCT 28.2*  MCV 92.2  PLT 671*   Cardiac Enzymes: No results found for this basename: CKTOTAL:3,CKMB:3,CKMBINDEX:3,TROPONINI:3 in the last 72  hours BNP: No results found for this basename: PROBNP:3 in the last 72 hours D-Dimer: No results found for this basename: DDIMER:2 in the last 72 hours CBG:  Basename 03/19/11 0741 03/19/11 0516 03/19/11 0021 03/18/11 2014 03/18/11 1611 03/18/11 1151  GLUCAP 142* 155* 128* 119* 125* 139*   Hemoglobin A1C: No results found for this basename: HGBA1C in the last 72 hours Fasting Lipid Panel: No results found for this basename: CHOL,HDL,LDLCALC,TRIG,CHOLHDL,LDLDIRECT in the last 72 hours Thyroid Function Tests: No results found for this basename: TSH,T4TOTAL,FREET4,T3FREE,THYROIDAB in the last 72 hours Anemia Panel: No results found for this basename: VITAMINB12,FOLATE,FERRITIN,TIBC,IRON,RETICCTPCT in the last 72 hours Coagulation: No results found for this basename: LABPROT:2,INR:2 in the last 72 hours Urine Drug Screen: Drugs of Abuse  No results found for this basename: labopia,  cocainscrnur,  labbenz,  amphetmu,  thcu,  labbarb    Alcohol Level: No results found for this basename: ETH:2 in the last 72 hours   Micro: No results found for this or any previous visit (from the past 240 hour(s)).  Studies/Results: Mr [name] NG Contrast  03/17/2011  *RADIOLOGY REPORT*  Clinical Data: 71 year old male with prolonged encephalopathy. Decreased level of  consciousness.  MRI HEAD WITHOUT AND WITH CONTRAST  Technique:  Multiplanar, multiecho pulse sequences of the brain and surrounding structures were obtained according to standard protocol without and with intravenous contrast  Contrast: 13mL MULTIHANCE GADOBENATE DIMEGLUMINE 529 MG/ML IV SOLN  Comparison: Head CT 03/10/2011.  [facility] brain MRI 12/11/2009.  Findings: Extensive bilateral occipital lobe encephalomalacia with gliosis has not significantly changed since 2011. Chronic left basal ganglia lacunar infarct with associated chronic blood products. Stable small chronic lacunar infarct in the left thalamus.  Chronic blood  products in the right paracentral pons noted without associated encephalomalacia.  This is new since 2011. Stable chronic lacunar infarct left lateral cerebellum.  Confluent periventricular white matter T2 and FLAIR hyperintensity with scattered additional small white matter lacunar infarcts also is not significantly changed since 2011.  No restricted diffusion to suggest acute infarction.   No new areas of signal abnormality in the brain. Major intracranial vascular flow voids are preserved.  Negative pituitary, cervicomedullary junction and visualized cervical spine.  Normal bone marrow signal. No abnormal enhancement identified.  Stable orbits with postoperative changes to the right globe. Negative scalp soft tissues.  IMPRESSION: 1. No acute intracranial abnormality. 2.  Advanced chronic ischemic disease with mild progression since 2011.  Original Report Authenticated By: Harley Hallmark, M.D.   US Abdomen Complete  03/17/2011  *RADIOLOGY REPORT*  Clinical Data:  Nausea and vomiting.  Elevated lipase  COMPLETE ABDOMINAL ULTRASOUND  Comparison:  None.  Findings:  Gallbladder:  Small gallstones are present in the neck of the gallbladder measuring 4 mm.  Gallbladder sludge is present. Gallbladder wall measures 1.8 mm.  Negative sonographic Murphy's sign.  Common bile duct:  3.4 mm  Liver:  No focal lesion identified.  Within normal limits in parenchymal echogenicity.  IVC:  Appears normal.  Pancreas:  Limited visualization of the pancreas without edema or mass.  Spleen:  6.9 mm  Right Kidney:  11.0 cm.  Negative for obstruction.  Left Kidney:  11.3 cm.  18 x 19 mm left upper pole cyst.  Abdominal aorta:  No aneurysm identified.  IMPRESSION: Cholelithiasis with gallbladder sludge.  No gallbladder wall thickening is present.  Bile ducts are nondilated.  Original Report Authenticated By: Camelia Phenes, M.D.   Dg Chest Port 1 View  03/18/2011  *RADIOLOGY REPORT*  Clinical Data: Respiratory failure.  PORTABLE CHEST  - 1 VIEW  Comparison: 03/15/2011.  Findings: Right upper extremity PICC appears unchanged.  There is no interval change in pulmonary aeration.  Interval removal of enteric tube.  Bilateral lower lobe airspace opacities are present along with atelectasis.  Cardiomediastinal silhouette is unchanged.  IMPRESSION: No interval change in pulmonary aeration with basilar predominant airspace disease.  Unchanged right upper extremity PICC with removal of enteric tube.  Original Report Authenticated By: Andreas Newport, M.D.   Dg Swallowing Func-no Report  03/17/2011  CLINICAL DATA: dysphagia   FLUOROSCOPY FOR SWALLOWING FUNCTION STUDY:  Fluoroscopy was provided for swallowing function study, which was  administered by a speech pathologist.  Final results and recommendations  from this study are contained within the speech pathology report.      Medications: I have reviewed the patient's current medications.  Assessment: Principal Problem:  *Respiratory failure Active Problems:  CAP (community acquired pneumonia)  Toxic encephalopathy  Dehydration  Rhabdomyolysis  Benign hypertension  Hyperlipidemia  GERD (gastroesophageal reflux disease)  Chronic pain  Hypokalemia  Anemia  Elevated troponin I level  Hypernatremia  Hyperglycemia  Elevated LFTs  Non-ST elevation MI (NSTEMI)  Pulmonary edema  Thrombocytosis  Hyponatremia  Fecal impaction  Cortical blindness  Gross hematuria   Acute respiratory failure secondary to community-acquired pneumonia. Query superimposed pulmonary edema. His followup chest x-rays revealed persistent bibasilar opacities. . His urinalysis still has WBCs. He is status post extubation. He may have aspirated with the NG tube feedings. He is now oxygenating 98% on 2-3 L. He is now on Levaquin, cefepime, and vancomycin. Steroids have been tapered down to 20 mg daily. He is on Lasix 20 mg IV every 12 hours. He continues on Atrovent and albuterol nebulizations. The plan is to  narrow his antibiotics to just Levaquin alone and then follow his fever curve as he has been on antibiotics for greater than 2 weeks.  Tachypnea. He has respiratory alkalosis. His fast respiratory rate has resolved following the start of the fentanyl patch. The tachypnea was thought to be centrally mediated, but it is possible that his tachypnea was driven by discomfort which he was unable to adequately communicate.   Nausea and vomiting, secondary to fecal impaction versus questionable acute pancreatitis, per the results of the KUB and lipase. His LFTs are within normal limits. His lipase is elevated although I'm not convinced that he has acute pancreatitis. He was given an enema  with good results. Ultrasound of his abdomen on January 8 revealed cholelithiasis and gallbladder sludge but no gallbladder wall thickening. Will retry tube feedings were restarted with success, however, the patient pulled out his NG tube.  Hyponatremia and hypokalemia. Continuing to adjust IV fluids and supplement potassium chloride.  Toxic Metabolic encephalopathy. Overall, he appears to be responding a little better each day.. Per her neurologist Dr. 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 However, the patient appears to give good eye contact and he appears to have seen my  fingers held up when asked him how many fingers I had up. 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  His EEG revealed no epileptiform activity.Marland Kitchen   Dysphagia. The speech therapist evaluation is noted for severe dysphagia, per MBSS. I will discuss PEG tube placement with his daughter was discussed by the RN and his daughter agreed. However, in light of the patient's improvement in alertness and cognition, we'll asked the speech therapist to come back by to reassess him. I have discussed him with gastroenterologist Dr. Karilyn Cota  for a possibility of PEG tube placement.  Chronic pain syndrome. Fentanyl patch was restarted at half of his usual home dose. He appears more comfortable and less tachypnea with the start.  Plan:  We'll assess these therapist to come back by to reevaluate the patient now that he is more alert and his cognition appears to be improving slightly.  If he does poorly with the followup speech evaluation, I have discussed PEG tube placement with Dr. Karilyn Cota. This will be confirmed with his daughter as was discussed with the RN yesterday.  Will narrow antibiotics to just Levaquin alone as all of his cultures have been negative to date. We'll decrease Lasix to daily instead of twice a day.  Will transfer out of the ICU today.     LOS: 16 days   Moe Brier 03/19/2011, 10:01 AM

## 2011-03-19 NOTE — Procedures (Signed)
Modified Barium Swallow Procedure Note Inpatient #2 Patient Details  Name: Marcus [name] MRN: [id number] Date of Birth: 09-05-1940  Today's Date: 03/19/2011 Time:  - 1600    Past Medical History:  Past Medical History  Diagnosis Date  . Hypertension   . GERD (gastroesophageal reflux disease)   . Hypercholesteremia   . Stroke   . Anemia 03/06/2011  . Smoker   . Non-ST elevation MI (NSTEMI) 03/07/2011    With PNA and resp failure  . Fecal impaction 03/17/2011  . 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 No pertinent past surgical history. HPI:   See previous BSE/MBS  Recommendation/Prognosis  Clinical Impression Dysphagia Diagnosis: Moderate oral phase dysphagia;Moderate pharyngeal phase dysphagia Clinical impression: Moderate oropharyngeal phase dysphagia (improved some since Tuesday) characterized by decreased labial closure, impaired oral control with holding of bolus, premature spillage, decreased hyolaryngeal excursion, decreased epiglottic deflection with penetration and aspiration of thins and nectars before and during the swallow without benefit of strong cough. At bedside he did have strong cough response to thin liquids. His tonsills no longer appear swollen like they did on Tuesday. Calcified carotids were superimposed along posterior pharyngeal wall (as confirmed by radiologist, Dr. Tyron Russell). Pt. appears safest with puree and honey-thick liquids by teaspoon presentation with least risk of aspiration, however it is doubtful that will be able to meet nutritional needs orally due to decreased cognition and refusal for much po. Family is deciding whether to pursue PEG. His baseline cognitive status is unknown. Recommend puree with HTL by teaspoon and follow up with SLP at receiving facility for diet tolerance/upgrades as appropriate. Swallow Recommendations Solid Consistency: Dysphagia 1 (Puree) Liquid Consistency: Honey Liquid Administration via:  Spoon Medication Administration: Crushed with puree Supervision: Staff feed patient Compensations: Slow rate;Small sips/bites;Unable to follow commands to complete Postural Changes and/or Swallow Maneuvers: Seated upright 90 degrees;Upright 30-60 min after meal Oral Care Recommendations: Staff/trained caregiver to provide oral care;Oral care before and after PO Other Recommendations: Order thickener from pharmacy;Clarify dietary restrictions Follow up Recommendations: Skilled Nursing facility Prognosis Prognosis for Safe Diet Advancement: Guarded Barriers to Reach Goals: Cognitive deficits;Severity of dysphagia;Motivation;Behavior Individuals Consulted Consulted and Agree with Results and Recommendations: Patient unable/family or caregiver not available  SLP Assessment/Plan  Begin puree with honey-thick liquids by teaspoon.   General:  Date of Onset: 03/03/11 HPI: Please see previous BSE and MBS from 03/17/2011. Since Tuesday's MBS, [name] has become more alert and participated in po trials at bedside earlier today with SLP (positive cough with thins). He pulled his NG tube the other day and attempt at replacement was unsuccessful.  Pt shakes his head "no" when asked if he would be agreeable to PEG, but refuses po beyond a few bites. Type of Study: Repeat MBS Diet Prior to this Study: NPO Temperature Spikes Noted: No Respiratory Status: Supplemental O2 delivered via (comment) (3L nasal cannula) History of Intubation: Yes Length of Intubations (days): 10 days Date extubated: 03/13/11 Behavior/Cognition: Alert;Requires cueing;Doesn't follow directions Oral Cavity - Dentition: Missing dentition Oral Motor / Sensory Function: Impaired - see Bedside swallow eval Vision: Impaired for self-feeding Patient Positioning: Upright in chair Baseline Vocal Quality: Breathy Volitional Cough: Cognitively unable to elicit Volitional Swallow: Unable to elicit Anatomy: Other (Comment) (tonsills do  not appear enlarged as they did on Tues. ) Pharyngeal Secretions: Not observed secondary MBS Ice chips: Not tested  Reason for Referral:  Objectively evaluate swallow function due to concerns of possible aspiration and identify appropriate diet and compensatory  strategies as needed. Pt with improved alertness and no longer has NG tube.  Oral Phase Oral Preparation/Oral Phase Oral Phase: Impaired Oral - Honey Oral - Honey Teaspoon: Holding of bolus;Delayed oral transit Oral - Nectar Oral - Nectar Cup: Left anterior bolus loss;Holding of bolus;Delayed oral transit;Weak lingual manipulation;Lingual/palatal residue Oral - Nectar Straw:  (pt unable to suck from straw) Oral - Thin Oral - Thin Cup: Left anterior bolus loss;Right anterior bolus loss;Weak lingual manipulation;Reduced posterior propulsion;Lingual/palatal residue;Delayed oral transit;Holding of bolus Oral - Solids Oral - Puree: Weak lingual manipulation;Lingual/palatal residue;Holding of bolus;Delayed oral transit Oral Phase - Comment Oral Phase - Comment: Weak lingual manipulation, holding of bolus  Pharyngeal Phase  Pharyngeal Phase Pharyngeal Phase: Impaired Pharyngeal - Honey Pharyngeal - Honey Teaspoon: Delayed swallow initiation;Premature spillage to pyriform;Reduced epiglottic inversion;Reduced anterior laryngeal mobility;Reduced laryngeal elevation Pharyngeal - Honey Cup: Not tested Pharyngeal - Nectar Pharyngeal - Nectar Cup: Delayed swallow initiation;Premature spillage to pyriform;Reduced epiglottic inversion;Reduced anterior laryngeal mobility;Reduced laryngeal elevation;Reduced tongue base retraction;Reduced airway/laryngeal closure;Penetration/Aspiration during swallow;Penetration/Aspiration before swallow;Trace aspiration;Lateral channel residue Penetration/Aspiration details (nectar cup): Material enters airway, passes BELOW cords then ejected out Pharyngeal - Nectar Straw: Not tested Pharyngeal -  Thin Pharyngeal - Thin Cup: Delayed swallow initiation;Premature spillage to pyriform;Reduced anterior laryngeal mobility;Reduced laryngeal elevation;Reduced epiglottic inversion;Reduced airway/laryngeal closure;Trace aspiration;Penetration/Aspiration before swallow Penetration/Aspiration details (thin cup): Material enters airway, passes BELOW cords and not ejected out despite cough attempt by patient;Material enters airway, passes BELOW cords then ejected out Pharyngeal - Solids Pharyngeal - Puree: Premature spillage to valleculae;Reduced anterior laryngeal mobility;Reduced laryngeal elevation;Reduced tongue base retraction;Pharyngeal residue - valleculae (repeat/dry swallows) Pharyngeal Phase - Comment Pharyngeal Comment: premature spillage, delay in initiation, reduced hyolaryngeal excursion  Cervical Esophageal Phase  Cervical Esophageal Phase Cervical Esophageal Phase: Prentiss Bells, CCC-SLP [phone]  Zhanna Melin 03/19/2011, 8:01 PM

## 2011-03-20 ENCOUNTER — Encounter (HOSPITAL_COMMUNITY)
Admission: EM | Disposition: A | Discharge status: Skilled Nursing Facility | Payer: Self-pay | Source: Home / Self Care | Attending: Internal Medicine

## 2011-03-20 ENCOUNTER — Other Ambulatory Visit: Payer: Self-pay | Admitting: Radiology

## 2011-03-20 ENCOUNTER — Inpatient Hospital Stay (HOSPITAL_COMMUNITY): Payer: Medicare Other

## 2011-03-20 ENCOUNTER — Encounter (HOSPITAL_COMMUNITY): Payer: Self-pay | Admitting: *Deleted

## 2011-03-20 DIAGNOSIS — R1312 Dysphagia, oropharyngeal phase: Secondary | ICD-10-CM

## 2011-03-20 DIAGNOSIS — K319 Disease of stomach and duodenum, unspecified: Secondary | ICD-10-CM

## 2011-03-20 DIAGNOSIS — K7290 Hepatic failure, unspecified without coma: Secondary | ICD-10-CM

## 2011-03-20 HISTORY — PX: PEG PLACEMENT: SHX5437

## 2011-03-20 HISTORY — PX: ESOPHAGOGASTRODUODENOSCOPY: SHX5428

## 2011-03-20 LAB — GLUCOSE, CAPILLARY
Glucose-Capillary: 119 mg/dL — ABNORMAL HIGH (ref 70–99)
Glucose-Capillary: 143 mg/dL — ABNORMAL HIGH (ref 70–99)
Glucose-Capillary: 154 mg/dL — ABNORMAL HIGH (ref 70–99)
Glucose-Capillary: 164 mg/dL — ABNORMAL HIGH (ref 70–99)

## 2011-03-20 LAB — BASIC METABOLIC PANEL
BUN: 20 mg/dL (ref 6–23)
Calcium: 9.2 mg/dL (ref 8.4–10.5)
GFR calc Af Amer: >90 mL/min (ref >90)
GFR calc non Af Amer: >90 mL/min (ref >90)
Potassium: 3.3 mEq/L — ABNORMAL LOW (ref 3.5–5.1)

## 2011-03-20 LAB — CBC
Hemoglobin: 8.8 g/dL — ABNORMAL LOW (ref 13.0–17.0)
MCH: 31.3 pg (ref 26.0–34.0)
MCHC: 33.5 g/dL (ref 30.0–36.0)
Platelets: 496 10*3/uL — ABNORMAL HIGH (ref 150–400)
RDW: 13.7 % (ref 11.5–15.5)

## 2011-03-20 LAB — URINE CULTURE
Colony Count: NO GROWTH
Culture: NO GROWTH

## 2011-03-20 SURGERY — EGD (ESOPHAGOGASTRODUODENOSCOPY)
Anesthesia: Moderate Sedation

## 2011-03-20 MED ORDER — MEPERIDINE HCL 100 MG/ML IJ SOLN
INTRAMUSCULAR | Status: AC
  Filled 2011-03-20: qty 1

## 2011-03-20 MED ORDER — MIDAZOLAM HCL 5 MG/5ML IJ SOLN
INTRAMUSCULAR | Status: DC | PRN
  Administered 2011-03-20 (×3): 1 mg via INTRAVENOUS

## 2011-03-20 MED ORDER — MEPERIDINE HCL 25 MG/ML IJ SOLN
INTRAMUSCULAR | Status: DC | PRN
  Administered 2011-03-20: 25 mg via INTRAVENOUS

## 2011-03-20 MED ORDER — PREDNISONE 10 MG PO TABS
15.0000 mg | ORAL_TABLET | Freq: Every day | ORAL | Status: DC
  Filled 2011-03-20: qty 2

## 2011-03-20 MED ORDER — SODIUM CHLORIDE 0.45 % IV SOLN
Freq: Once | INTRAVENOUS | Status: AC
  Administered 2011-03-20: 11:00:00 via INTRAVENOUS

## 2011-03-20 MED ORDER — STERILE WATER FOR IRRIGATION IR SOLN
Status: DC | PRN
  Administered 2011-03-20: 12:00:00

## 2011-03-20 MED ORDER — ALPRAZOLAM 0.25 MG PO TABS
0.2500 mg | ORAL_TABLET | Freq: Three times a day (TID) | ORAL | Status: DC | PRN
  Administered 2011-03-20 – 2011-03-21 (×2): 0.25 mg via ORAL
  Filled 2011-03-20 (×2): qty 1

## 2011-03-20 MED ORDER — CEFAZOLIN SODIUM 1-5 GM-% IV SOLN
1.0000 g | INTRAVENOUS | Status: AC
  Filled 2011-03-20: qty 50

## 2011-03-20 MED ORDER — POTASSIUM CHLORIDE CRYS ER 20 MEQ PO TBCR
20.0000 meq | EXTENDED_RELEASE_TABLET | Freq: Two times a day (BID) | ORAL | Status: DC
  Administered 2011-03-21 (×2): 20 meq via ORAL
  Filled 2011-03-20 (×3): qty 1

## 2011-03-20 MED ORDER — BUTAMBEN-TETRACAINE-BENZOCAINE 2-2-14 % EX AERO
INHALATION_SPRAY | CUTANEOUS | Status: DC | PRN
  Administered 2011-03-20: 2 via TOPICAL

## 2011-03-20 MED ORDER — INSULIN ASPART 100 UNIT/ML ~~LOC~~ SOLN
0.0000 [IU] | Freq: Three times a day (TID) | SUBCUTANEOUS | Status: DC
  Administered 2011-03-21 – 2011-03-22 (×6): 1 [IU] via SUBCUTANEOUS
  Administered 2011-03-23: 2 [IU] via SUBCUTANEOUS
  Administered 2011-03-23: 1 [IU] via SUBCUTANEOUS

## 2011-03-20 MED ORDER — BISACODYL 10 MG RE SUPP
10.0000 mg | RECTAL | Status: DC
  Administered 2011-03-20 – 2011-03-26 (×4): 10 mg via RECTAL
  Filled 2011-03-20 (×4): qty 1

## 2011-03-20 MED ORDER — MIDAZOLAM HCL 5 MG/5ML IJ SOLN
INTRAMUSCULAR | Status: AC
  Filled 2011-03-20: qty 5

## 2011-03-20 MED ORDER — LIDOCAINE HCL (PF) 1 % IJ SOLN
INTRAMUSCULAR | Status: DC | PRN
  Administered 2011-03-20: 2 mL via SUBCUTANEOUS

## 2011-03-20 MED ORDER — KCL IN DEXTROSE-NACL 40-5-0.45 MEQ/L-%-% IV SOLN
INTRAVENOUS | Status: DC
  Administered 2011-03-20: 16:00:00 via INTRAVENOUS

## 2011-03-20 MED ORDER — SODIUM CHLORIDE 0.9 % IJ SOLN
INTRAMUSCULAR | Status: AC
  Administered 2011-03-20: 10 mL
  Filled 2011-03-20: qty 3

## 2011-03-20 NOTE — Progress Notes (Signed)
Subjective: Patient is sitting up in the bed. He was transferred out of the ICU yesterday after being there for 2-1/2 weeks. He is looking around his with a confused look on his face. The nursing staff notes that he has been trying to pull off his oxygen tubing. They have noticed bright red blood in his Foley bag. It is unclear if he has been pulling at his Foley catheter.   Objective: Vital signs in last 24 hours: Filed Vitals:   03/20/11 1155 03/20/11 1217 03/20/11 1303 03/20/11 1436  BP:  135/70    Pulse: 100 96    Temp:  99.3 F (37.4 C)  100.6 F (38.1 C)  TempSrc:  Oral  Oral  Resp: 28 24    Height:      Weight:      SpO2: 94% 93% 93%     Intake/Output Summary (Last 24 hours) at 03/20/11 1509 Last data filed at 03/20/11 1300  Gross per 24 hour  Intake   1091 ml  Output    675 ml  Net    416 ml    Weight change:   Physical exam: Lungs: Few bilateral crackles in the bases and clear anteriorly. He does have a rapid breathing rate, but overall, it has been significantly less following the restart of the fentanyl patch.. Heart: S1, S2, with a soft systolic murmur. Abdomen: Hypoactive bowel sounds, mildly tender in the epigastrium, no masses, no rigidity, no guarding. GU: Foley catheter indwelling and the patient's circumcised meatus. No surrounding meatal erythema or bloody drainage. There is a pink-colored urine in the Foley bag. Extremities: No pedal edema. Neurologic: He is alert. He gives eye contact. He nods to questions. I asked him how many fingers that I had up. He did softly say to which was correct.  Lab Results: Basic Metabolic Panel:  Basename 03/20/11 0452 03/19/11 0451  NA 144 139  K 3.3* 3.1*  CL 114* 108  CO2 21 21  GLUCOSE 152* 174*  BUN 20 19  CREATININE 0.73 0.74  CALCIUM 9.2 9.1  MG -- --  PHOS -- --   Liver Function Tests: No results found for this basename: AST:2,ALT:2,ALKPHOS:2,BILITOT:2,PROT:2,ALBUMIN:2 in the last 72 hours  Basename  03/18/11 0510  LIPASE 105*  AMYLASE --   No results found for this basename: AMMONIA:2 in the last 72 hours CBC:  Basename 03/20/11 0452  WBC 15.2*  NEUTROABS --  HGB 8.8*  HCT 26.3*  MCV 93.6  PLT 496*   Cardiac Enzymes: No results found for this basename: CKTOTAL:3,CKMB:3,CKMBINDEX:3,TROPONINI:3 in the last 72 hours BNP: No results found for this basename: PROBNP:3 in the last 72 hours D-Dimer: No results found for this basename: DDIMER:2 in the last 72 hours CBG:  Basename 03/20/11 1058 03/20/11 0727 03/20/11 0419 03/20/11 0009 03/19/11 2013 03/19/11 1639  GLUCAP 146* 154* 143* 164* 140* 179*   Hemoglobin A1C: No results found for this basename: HGBA1C in the last 72 hours Fasting Lipid Panel: No results found for this basename: CHOL,HDL,LDLCALC,TRIG,CHOLHDL,LDLDIRECT in the last 72 hours Thyroid Function Tests: No results found for this basename: TSH,T4TOTAL,FREET4,T3FREE,THYROIDAB in the last 72 hours Anemia Panel: No results found for this basename: VITAMINB12,FOLATE,FERRITIN,TIBC,IRON,RETICCTPCT in the last 72 hours Coagulation: No results found for this basename: LABPROT:2,INR:2 in the last 72 hours Urine Drug Screen: Drugs of Abuse  No results found for this basename: labopia,  cocainscrnur,  labbenz,  amphetmu,  thcu,  labbarb    Alcohol Level: No results found for this basename:  ETH:2 in the last 72 hours   Micro: No results found for this or any previous visit (from the past 240 hour(s)).  Studies/Results: Dg Swallowing Function  03/19/2011  *RADIOLOGY REPORT*  Clinical Data: Dysphagia, improved alertness  SWALLOWING FUNCTION STUDY:  Technique:  Patient swallowed varying consistencies of barium with the assistance of a speech pathologist.  I performed real-time fluoroscopic assessment in the lateral projection.  Fluoroscopy time:  3.0 minutes  Comparison:  None available  Findings: Incidentally noted dense atherosclerotic calcification within the carotid  systems bilaterally.  With applesauce consistency, impaired oral phase was identified. Spillover of contrast to the vallecula occurred.  No laryngeal penetration or aspiration.  Oral residuals were noted.  With thin barium by teaspoon and cup, spillover of contrast to the vallecula was identified with delayed initiation of swallow. Laryngeal penetration was seen with both presentations, though aspiration was only identified with the cup swallow.  With nectar consistency by cup, laryngeal penetration was identified without aspiration.  With nectar consistency by straw, significant oral holding of contrast was identified.  This represented a large contrast volume and when swallowed, laryngeal penetration and aspiration were identified.  Additional presentation of nectar consistency by teaspoon demonstrated delayed initiation of swallow and laryngeal penetration without aspiration.  With honey consistency by teaspoon, spillover of contrast to the vallecula was identified without laryngeal penetration or aspiration.  Mild oral residual noted.  IMPRESSION: Swallowing dysfunction as above.  Dense atherosclerotic calcifications noted at the carotid systems bilaterally; recommend carotid duplex sonography for further assessment.  Original Report Authenticated By: Lollie Marrow, M.D.    Medications: I have reviewed the patient's current medications.  Assessment: Principal Problem:  *Respiratory failure Active Problems:  CAP (community acquired pneumonia)  Toxic encephalopathy  Dehydration  Rhabdomyolysis  Benign hypertension  Hyperlipidemia  GERD (gastroesophageal reflux disease)  Chronic pain  Hypokalemia  Anemia  Elevated troponin I level  Hypernatremia  Hyperglycemia  Elevated LFTs  Non-ST elevation MI (NSTEMI)  Pulmonary edema  Thrombocytosis  Hyponatremia  Fecal impaction  Gross hematuria   Acute respiratory failure secondary to community-acquired pneumonia. He was successfully extubated.  Query superimposed pulmonary edema. He's been treated with IV Lasix for several days with no significant improvement in his chest x-ray and therefore, it will be discontinued. He has been treated with broad-spectrum antibiotics since admission. Antibiotic therapy was narrowed to Levaquin only yesterday. He is still mildly febrile. His blood cultures have been negative to date. He continues on albuterol and Atrovent nebulizers. Steroids have been tapered down to daily Solu-Cortef. If he is able to take medications by mouth, the Solu-Cortef will be discontinued he will be tapered on prednisone.   Tachypnea. He has respiratory alkalosis. His tachypnea has decreased some on the start of the fentanyl patch. The tachypnea may be centrally mediated, but it is possible that his tachypnea is driven by discomfort which he was unable to adequately communicate. He also may have an element of anxiety. Either Xanax or Ativan will be added when necessary.  Gross hematuria. It is unclear if the patient had pulled at his Foley catheter now that he is more active in bed. He has had a Foley catheter in since hospital admission. Lovenox was continued yesterday he in anticipation of the PEG tube placement. We will order a urine culture and renal ultrasound for followup. I have asked the nursing staff to flush the Foley catheter.  Nausea and vomiting, secondary to fecal impaction versus questionable acute pancreatitis, per the results of  the KUB and lipase several days ago. His LFTs were within normal limits. His lipase is elevated although I did not initially believe that he had clinical acute pancreatitis. He was given an enema  with good results, several days ago. Ultrasound of his abdomen on January 8 revealed cholelithiasis and gallbladder sludge but no gallbladder wall thickening.  Hyponatremia and hypokalemia. Continuing to adjust IV fluids and supplement potassium chloride.  Toxic Metabolic encephalopathy. 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 Overall, he appears to be responding a little better each day. He appears to be more interactive and more anxious. He is moving more in bed. Per her neurologist Dr. 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 However, the patient appears to give good eye contact. Yesterday, he counted my fingers correctly. 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  His EEG revealed no epileptiform activity. I discussed his baseline cognitive status with his daughter Ms. Lockamy. She did acknowledge some baseline confusion at home but it was not profound.   Dysphagia. The speech therapist evaluation is noted for severe dysphagia, per initial MBS. Followup in MBS yesterday showed some improvement therefore pured diet with honey thickened liquid was ordered. We will see if he can tolerate it. I did discuss PEG tube placement with his daughter Eudelia Bunch (phone [phone]). She wants him to have a PEG tube which I think is reasonable given that it is unlikely that he will eat enough to sustain a good nutritional status. Gastroenterologist Dr. Karilyn Cota was consulted. He attempted PEG tube placement today, however, it was aborted when he realized that the patient's stomach was too high riding. Therefore, he will will discuss/and or schedule placement with interventional radiology at [facility]. It is likely placement that will be done early next week. for a possibility.  Chronic pain syndrome. Fentanyl patch was restarted at half of his usual home dose. He appears more comfortable and less tachypnea with the start.  Plan:  1. We'll check another set of blood cultures for temperature greater than 100.5.F. 2. We'll check a renal ultrasound and urine culture. Will continue Levaquin only for now. Dr. Karilyn Cota will arrange interventional radiology PEG tube placement at  [facility]. 3. Will add every other day dosing of Dulcolax suppository given his history of constipation. 4. Will proceed with pured diet and oral medications. 5. We'll discontinue Lasix and continue IV fluids with potassium chloride added. Will restart potassium chloride orally. We'll discontinue Solu-Cortef and start small dosing of prednisone. 6. We'll order CBC, lipase, LFTs, BMET, and pro BNP in the morning. 7. The plan is to discharge the patient to a skilled nursing facility of choice. His daughter is in agreement.    LOS: 17 days   Merilee Wible 03/20/2011, 3:09 PM

## 2011-03-20 NOTE — Progress Notes (Signed)
Patient very tachypnea,restless,placed on 50% venturi mask,02 sat 92%.Patient less anxious.

## 2011-03-20 NOTE — Consult Note (Signed)
Reason for Consult: Dysphagia Referring Physician: Asaph Marcus [name] is an 71 y.o. male.  (Patient is unable to provide history) HPI:  Marcus [name] is a 71 yr old male admitted with pneumonia and respiratory failure. He was apparently found at home by one of his daughter lying in feces and urine. He was brought to the ED for evaluation.   He has very little po intake. Patient can answer yes and no questions.  ? Has had an hypoxic injury from respiratory failure.   We were asked to evaluate for possible PEG placement.  He underwent a Modified Swallow test yesterday (see below).   He has very poor po intake.  H and H 8.8 and 26.3 03/19/2010  Modified Swallow test 03/18/2010  See previous BSE/MBS  Recommendation/Prognosis  Clinical Impression  Dysphagia Diagnosis: Moderate oral phase dysphagia;Moderate pharyngeal phase dysphagia  Clinical impression: Moderate oropharyngeal phase dysphagia (improved some since Tuesday) characterized by decreased labial closure, impaired oral control with holding of bolus, premature spillage, decreased hyolaryngeal excursion, decreased epiglottic deflection with penetration and aspiration of thins and nectars before and during the swallow without benefit of strong cough. At bedside he did have strong cough response to thin liquids. His tonsills no longer appear swollen like they did on Tuesday. Calcified carotids were superimposed along posterior pharyngeal wall (as confirmed by radiologist, Dr. Tyron Russell). Pt. appears safest with puree and honey-thick liquids by teaspoon presentation with least risk of aspiration, however it is doubtful that will be able to meet nutritional needs orally due to decreased cognition and refusal for much po. Family is deciding whether to pursue PEG. His baseline cognitive status is unknown. Recommend puree with HTL by teaspoon and follow up with SLP at receiving facility for diet tolerance/upgrades as appropriate.  Swallow Recommendations  Solid  Consistency: Dysphagia 1 (Puree)  Liquid Consistency: Honey  Liquid Administration via: Spoon  Medication Administration: Crushed with puree  Supervision: Staff feed patient  Compensations: Slow rate;Small sips/bites;Unable to follow commands to complete  Postural Changes and/or Swallow Maneuvers: Seated upright 90 degrees;Upright 30-60 min after meal  Oral Care Recommendations: Staff/trained caregiver to provide oral care;Oral care before and after PO  Other Recommendations: Order thickener from pharmacy;Clarify dietary restrictions  Follow up Recommendations: Skilled Nursing facility  Prognosis  Prognosis for Safe Diet Advancement: Guarded  Barriers to Reach Goals: Cognitive deficits;Severity of dysphagia;Motivation;Behavior  Individuals Consulted  Consulted and Agree with Results and Recommendations: Patient unable/family or caregiver not available  Past Medical History  Diagnosis Date  . Hypertension   . GERD (gastroesophageal reflux disease)   . Hypercholesteremia   . Stroke   . Anemia 03/06/2011  . Smoker   . Non-ST elevation MI (NSTEMI) 03/07/2011    With PNA and resp failure  . Fecal impaction 03/17/2011  . 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 No pertinent past surgical history.  History reviewed. No pertinent family history.  Social History:  reports that he has quit smoking. He does not have any smokeless tobacco history on file. His alcohol and drug histories not on file.  Allergies: No Known Allergies  Medications:    Results for orders placed during the hospital encounter of 03/03/11 (from the past 48 hour(s))  GLUCOSE, CAPILLARY     Status: Abnormal   Collection Time   03/18/11 11:51 AM      Component Value Range Comment   Glucose-Capillary 139 (*) 70 - 99 (mg/dL)    Comment 1 Notify RN      Comment  2 Documented in Chart     GLUCOSE, CAPILLARY     Status: Abnormal   Collection Time   03/18/11  4:11 PM      Component Value Range Comment    Glucose-Capillary 125 (*) 70 - 99 (mg/dL)    Comment 1 Notify RN      Comment 2 Documented in Chart     GLUCOSE, CAPILLARY     Status: Abnormal   Collection Time   03/18/11  8:14 PM      Component Value Range Comment   Glucose-Capillary 119 (*) 70 - 99 (mg/dL)    Comment 1 Documented in Chart      Comment 2 Notify RN     GLUCOSE, CAPILLARY     Status: Abnormal   Collection Time   03/19/11 12:21 AM      Component Value Range Comment   Glucose-Capillary 128 (*) 70 - 99 (mg/dL)   BASIC METABOLIC PANEL     Status: Abnormal   Collection Time   03/19/11  4:51 AM      Component Value Range Comment   Sodium 139  135 - 145 (mEq/L)    Potassium 3.1 (*) 3.5 - 5.1 (mEq/L)    Chloride 108  96 - 112 (mEq/L)    CO2 21  19 - 32 (mEq/L)    Glucose, Bld 174 (*) 70 - 99 (mg/dL)    BUN 19  6 - 23 (mg/dL)    Creatinine, Ser 1.61  0.50 - 1.35 (mg/dL)    Calcium 9.1  8.4 - 10.5 (mg/dL)    GFR calc non Af Amer 90 (*) >90 (mL/min)    GFR calc Af Amer >90  >90 (mL/min)   GLUCOSE, CAPILLARY     Status: Abnormal   Collection Time   03/19/11  5:16 AM      Component Value Range Comment   Glucose-Capillary 155 (*) 70 - 99 (mg/dL)    Comment 1 Documented in Chart      Comment 2 Notify RN     GLUCOSE, CAPILLARY     Status: Abnormal   Collection Time   03/19/11  7:41 AM      Component Value Range Comment   Glucose-Capillary 142 (*) 70 - 99 (mg/dL)    Comment 1 Notify RN      Comment 2 Documented in Chart     GLUCOSE, CAPILLARY     Status: Abnormal   Collection Time   03/19/11 11:42 AM      Component Value Range Comment   Glucose-Capillary 138 (*) 70 - 99 (mg/dL)    Comment 1 Notify RN      Comment 2 Documented in Chart     GLUCOSE, CAPILLARY     Status: Abnormal   Collection Time   03/19/11  4:39 PM      Component Value Range Comment   Glucose-Capillary 179 (*) 70 - 99 (mg/dL)    Comment 1 Notify RN      Comment 2 Documented in Chart     GLUCOSE, CAPILLARY     Status: Abnormal   Collection Time    03/19/11  8:13 PM      Component Value Range Comment   Glucose-Capillary 140 (*) 70 - 99 (mg/dL)    Comment 1 Notify RN     GLUCOSE, CAPILLARY     Status: Abnormal   Collection Time   03/20/11 12:09 AM      Component Value Range Comment   Glucose-Capillary 164 (*)  70 - 99 (mg/dL)    Comment 1 Notify RN     GLUCOSE, CAPILLARY     Status: Abnormal   Collection Time   03/20/11  4:19 AM      Component Value Range Comment   Glucose-Capillary 143 (*) 70 - 99 (mg/dL)    Comment 1 Notify RN     BASIC METABOLIC PANEL     Status: Abnormal   Collection Time   03/20/11  4:52 AM      Component Value Range Comment   Sodium 144  135 - 145 (mEq/L)    Potassium 3.3 (*) 3.5 - 5.1 (mEq/L)    Chloride 114 (*) 96 - 112 (mEq/L)    CO2 21  19 - 32 (mEq/L)    Glucose, Bld 152 (*) 70 - 99 (mg/dL)    BUN 20  6 - 23 (mg/dL)    Creatinine, Ser 1.61  0.50 - 1.35 (mg/dL)    Calcium 9.2  8.4 - 10.5 (mg/dL)    GFR calc non Af Amer >90  >90 (mL/min)    GFR calc Af Amer >90  >90 (mL/min)   CBC     Status: Abnormal   Collection Time   03/20/11  4:52 AM      Component Value Range Comment   WBC 15.2 (*) 4.0 - 10.5 (K/uL)    RBC 2.81 (*) 4.22 - 5.81 (MIL/uL)    Hemoglobin 8.8 (*) 13.0 - 17.0 (g/dL)    HCT 09.6 (*) 04.5 - 52.0 (%)    MCV 93.6  78.0 - 100.0 (fL)    MCH 31.3  26.0 - 34.0 (pg)    MCHC 33.5  30.0 - 36.0 (g/dL)    RDW 40.9  81.1 - 91.4 (%)    Platelets 496 (*) 150 - 400 (K/uL)   GLUCOSE, CAPILLARY     Status: Abnormal   Collection Time   03/20/11  7:27 AM      Component Value Range Comment   Glucose-Capillary 154 (*) 70 - 99 (mg/dL)     Dg Swallowing Function  03/19/2011  *RADIOLOGY REPORT*  Clinical Data: Dysphagia, improved alertness  SWALLOWING FUNCTION STUDY:  Technique:  Patient swallowed varying consistencies of barium with the assistance of a speech pathologist.  I performed real-time fluoroscopic assessment in the lateral projection.  Fluoroscopy time:  3.0 minutes  Comparison:  None  available  Findings: Incidentally noted dense atherosclerotic calcification within the carotid systems bilaterally.  With applesauce consistency, impaired oral phase was identified. Spillover of contrast to the vallecula occurred.  No laryngeal penetration or aspiration.  Oral residuals were noted.  With thin barium by teaspoon and cup, spillover of contrast to the vallecula was identified with delayed initiation of swallow. Laryngeal penetration was seen with both presentations, though aspiration was only identified with the cup swallow.  With nectar consistency by cup, laryngeal penetration was identified without aspiration.  With nectar consistency by straw, significant oral holding of contrast was identified.  This represented a large contrast volume and when swallowed, laryngeal penetration and aspiration were identified.  Additional presentation of nectar consistency by teaspoon demonstrated delayed initiation of swallow and laryngeal penetration without aspiration.  With honey consistency by teaspoon, spillover of contrast to the vallecula was identified without laryngeal penetration or aspiration.  Mild oral residual noted.  IMPRESSION: Swallowing dysfunction as above.  Dense atherosclerotic calcifications noted at the carotid systems bilaterally; recommend carotid duplex sonography for further assessment.  Original Report Authenticated By: Lollie Marrow, M.D.  ROS Blood pressure 159/66, pulse 95, temperature 99.3 F (37.4 C), temperature source Axillary, resp. rate 20, height 5\' 4"  (1.626 m), weight 126 lb 5.2 oz (57.3 kg), SpO2 93.00%. Physical ExamAlert . Responds to yes and no questions.  He actually knows he is at [facility].  Skin warm and dry. Oral mucosa is dry. He is a mouth breather.    . Sclera anicteric, conjunctivae is pink. Thyroid not enlarged. No cervical lymphadenopathy. Bilateral rhonch  Resp. 40. i Heart regular rate rapid.   Abdomen is soft. Bowel sounds are positive. No  hepatomegaly. No abdominal masses felt. No tenderness.  No edema to lower extremities. Patient is alert.  Assessment/Plan Dysphagia. Dr. Karilyn Cota will place Peg Tube today.   SETZER,TERRI W 03/20/2011, 9:10 AM   GI attending note; I have examined patient and reviewed his record including evaluation of swallowing function performed on 03/19/2011. Patient has oropharyngeal dysphagia possibly secondary to metabolic encephalopathy. His oropharyngeal function it appears has improved but not to the point that he is ready for oral feeding. Therefore will proceed with PEG placement. I have talked with patient's daughter Ms. Feliberto Gottron over the phone and she agrees. Patient's last dose of Lovenox was 2 days ago. He is on Levaquin which should be adequate for antimicrobial prophylaxis.

## 2011-03-20 NOTE — Op Note (Signed)
EGD/PEG  PROCEDURE REPORT  PATIENT:  Marcus [name]  MR#:  [id number] Birthdate:  09/11/1940, 71 y.o., male Endoscopist:  Dr. Malissa Hippo, MD Referred By:  Dr. Bonnetta Barry ref. provider found Procedure Date: 03/20/2011  Procedure:   EGD. Could not be placed because of high riding stomach.  Indications:  Patient is a 71 year old Caucasian male with oral pharyngeal dysphagia possibly secondary to metabolic encephalopathy. He is undergoing this procedure for placement for feeding purposes.            Informed Consent: Informed consent for this procedure was obtained from patient's daughter [name] Medications:  Demerol 25 mg IV Versed 3 mg IV 1% Xylocaine percutaneous topical anesthesia. Cetacaine spray topically for oropharyngeal anesthesia  Description of procedure:  The endoscope was introduced through the mouth and advanced to the second portion of the duodenum without difficulty or limitations. The mucosal surfaces were surveyed very carefully during advancement of the scope and upon withdrawal.  Findings:  Esophagus:  Mucosa of the esophagus was normal. GEJ:  33 cm Hiatus:  36 cm Stomach:  Stomach was empty and distended value then insufflated to 3 falls in the proximal stomach was normal. Examination of mucosa and body, antrum, pyloric channel, angularis fundus and cardia was normal. Pyloric channel was patent. Duodenum:  Normal bulbar and post bulbar mucosa.  Therapeutic/Diagnostic Maneuvers Performed:  PEG not be placed because most of the stomach was under the rib cage. Proximally 2 cc of Xylocaine was used for cutaneous anesthesia no incision was made.  Complications: None.  Impression: Small sliding hiatal hernia. High riding stomach precluding placement of PEG.  Recommendations:  Schedule placement with interventional radiologist at [facility]. [name]  03/20/2011  12:10 PM  CC: Dr. Briscoe Burns, PA, PA & Dr. Bonnetta Barry ref. provider found

## 2011-03-20 NOTE — Progress Notes (Signed)
Nutrition Follow-up  MBSS completed. Pt unable to maintain adequate nutrition with oral intake. He was to have PEG placed today but unable to due to position of stomach. He is to follow up with interventional radiology at [facility] for PEG placement.   Diet Order: Dys I with Honey-thick liquids   Meds: Scheduled Meds:   . sodium chloride   Intravenous Once  . albuterol  2.5 mg Nebulization Q6H  . antiseptic oral rinse  1 application Mouth Rinse QID  . aspirin  81 mg Oral Daily  . bisacodyl  10 mg Rectal QODAY  . chlorhexidine  15 mL Mouth/Throat BID  . fentaNYL  50 mcg Transdermal Q72H  . hydrocortisone sod succinate (SOLU-CORTEF) injection  25 mg Intravenous Daily  . insulin aspart  0-9 Units Subcutaneous Q4H  . ipratropium  0.5 mg Nebulization Q6H  . levofloxacin (LEVAQUIN) IV  750 mg Intravenous Q24H  . meperidine      . metoprolol tartrate  12.5 mg Oral BID  . midazolam      . pantoprazole (PROTONIX) IV  40 mg Intravenous Q24H  . potassium chloride  30 mEq Oral BID  . sodium chloride  10 mL Intracatheter Q12H  . DISCONTD: furosemide  20 mg Intravenous Daily   Continuous Infusions:   . dextrose 5 % and 0.45 % NaCl with KCl 40 mEq/L    . DISCONTD: dextrose 5 % and 0.9 % NaCl with KCl 40 mEq/L 70 mL/hr at 03/20/11 0550  . DISCONTD: feeding supplement (JEVITY 1.2) 1,000 mL (03/17/11 1105)   PRN Meds:.acetaminophen, ALPRAZolam, metoprolol, ondansetron (ZOFRAN) IV, ondansetron, sodium chloride, DISCONTD: butamben-tetracaine-benzocaine, DISCONTD: lidocaine, DISCONTD: meperidine, DISCONTD: midazolam, DISCONTD: simethicone susp in sterile water 1000 mL irrigation  Labs:  CMP     Component Value Date/Time   NA 144 03/20/2011 0452   K 3.3* 03/20/2011 0452   CL 114* 03/20/2011 0452   CO2 21 03/20/2011 0452   GLUCOSE 152* 03/20/2011 0452   BUN 20 03/20/2011 0452   CREATININE 0.73 03/20/2011 0452   CALCIUM 9.2 03/20/2011 0452   PROT 6.4 03/17/2011 0421   ALBUMIN 2.3* 03/17/2011 0421   AST 14  03/17/2011 0421   ALT 16 03/17/2011 0421   ALKPHOS 58 03/17/2011 0421   BILITOT 0.5 03/17/2011 0421   GFRNONAA >90 03/20/2011 0452   GFRAA >90 03/20/2011 0452     Intake/Output Summary (Last 24 hours) at 03/20/11 1522 Last data filed at 03/20/11 1300  Gross per 24 hour  Intake   1091 ml  Output    675 ml  Net    416 ml    Weight Status: 126# (57kg) severe wt decr of 14# (6.8kg).  Wt decr may be at least partially r/t use of different scales.  Nutrition Dx: Inadequate oral intake cont r/t swallow difficulty AEB Dysphagia   Goal: Pt will sucessfully meet >90% of est nutritional needs   Interventions: -Dys I with honey-thick liquids/fed by staff -Add Magic Cup with meals -PEG placement to be scheduled with interventional radiology at [facility]  Monitor: po intake, wt status, labs and PEG placement.    Francene Boyers Pager [phone]

## 2011-03-20 NOTE — Progress Notes (Signed)
Hematuria noted to foley bag,Dr Fisher notified.Orders received,and given.Will continue to monitor patient.

## 2011-03-20 NOTE — Progress Notes (Signed)
I will plan to sign off at this point. He seems to be doing okay.  Thanks for allowing to see him with you I will plan to see him again if needed

## 2011-03-20 NOTE — Progress Notes (Signed)
CSW presented bed offers to pt's daughter and she chooses [facility].  Facility notified.  Daughter also provided Medicaid number which CSW shared with [facility].  Awaiting stability for d/c.   Karn Cassis

## 2011-03-21 ENCOUNTER — Inpatient Hospital Stay (HOSPITAL_COMMUNITY): Payer: Medicare Other

## 2011-03-21 DIAGNOSIS — R509 Fever, unspecified: Secondary | ICD-10-CM | POA: Diagnosis present

## 2011-03-21 DIAGNOSIS — R1310 Dysphagia, unspecified: Secondary | ICD-10-CM | POA: Clinically undetermined

## 2011-03-21 LAB — CULTURE, BLOOD (ROUTINE X 2): Culture: NO GROWTH

## 2011-03-21 LAB — CBC
HCT: 25.3 % — ABNORMAL LOW (ref 39.0–52.0)
Platelets: 408 10*3/uL — ABNORMAL HIGH (ref 150–400)
RDW: 14 % (ref 11.5–15.5)
WBC: 17.7 10*3/uL — ABNORMAL HIGH (ref 4.0–10.5)

## 2011-03-21 LAB — COMPREHENSIVE METABOLIC PANEL
ALT: 16 U/L (ref 0–53)
AST: 15 U/L (ref 0–37)
Albumin: 2.1 g/dL — ABNORMAL LOW (ref 3.5–5.2)
Alkaline Phosphatase: 69 U/L (ref 39–117)
Chloride: 113 mEq/L — ABNORMAL HIGH (ref 96–112)
Potassium: 2.5 mEq/L — CL (ref 3.5–5.1)
Sodium: 144 mEq/L (ref 135–145)
Total Bilirubin: 0.6 mg/dL (ref 0.3–1.2)

## 2011-03-21 LAB — URINE MICROSCOPIC-ADD ON

## 2011-03-21 LAB — GLUCOSE, CAPILLARY
Glucose-Capillary: 122 mg/dL — ABNORMAL HIGH (ref 70–99)
Glucose-Capillary: 124 mg/dL — ABNORMAL HIGH (ref 70–99)
Glucose-Capillary: 134 mg/dL — ABNORMAL HIGH (ref 70–99)
Glucose-Capillary: 149 mg/dL — ABNORMAL HIGH (ref 70–99)
Glucose-Capillary: 96 mg/dL (ref 70–99)

## 2011-03-21 LAB — URINALYSIS, ROUTINE W REFLEX MICROSCOPIC
Bilirubin Urine: NEGATIVE
Ketones, ur: NEGATIVE mg/dL
Nitrite: NEGATIVE
Urobilinogen, UA: 0.2 mg/dL (ref 0.0–1.0)

## 2011-03-21 LAB — MAGNESIUM: Magnesium: 2 mg/dL (ref 1.5–2.5)

## 2011-03-21 MED ORDER — POTASSIUM CHLORIDE IN NACL 40-0.9 MEQ/L-% IV SOLN
INTRAVENOUS | Status: AC
  Filled 2011-03-21: qty 1000

## 2011-03-21 MED ORDER — POTASSIUM CHLORIDE 10 MEQ/100ML IV SOLN
10.0000 meq | INTRAVENOUS | Status: AC
  Administered 2011-03-21 (×4): 10 meq via INTRAVENOUS
  Filled 2011-03-21 (×4): qty 100

## 2011-03-21 NOTE — Plan of Care (Signed)
Problem: Phase II Progression Outcomes Goal: Tolerating diet Outcome: Not Progressing Patient for peg tube placement on 03-23-11

## 2011-03-21 NOTE — Progress Notes (Signed)
Chart reviewed.  Subjective: Denies pain or shortness of breath.  Objective: Vital signs in last 24 hours: Filed Vitals:   03/21/11 0123 03/21/11 0500 03/21/11 0747 03/21/11 1115  BP:  165/80    Pulse:  88    Temp:  98.6 F (37 C)    TempSrc:  Oral    Resp:  26    Height:      Weight:      SpO2: 94% 95% 91% 95%   Weight change:   Intake/Output Summary (Last 24 hours) at 03/21/11 1255 Last data filed at 03/21/11 1107  Gross per 24 hour  Intake    300 ml  Output   1075 ml  Net   -775 ml   Physical Exam: General: Eyes closed. Arousable. HEENT: Dry mucous membranes Lungs: Rales, rhonchi Heart he vascular regular rate rhythm without murmurs gallops rubs Abdomen soft nontender nondistended Extremities no clubbing cyanosis or edema Neurologic: Nods yes and no somewhat appropriately. Patient does appear to focus.  Lab Results: Basic Metabolic Panel:  Lab 03/21/11 9147 03/20/11 0452 03/16/11 0425  NA 144 144 --  K 2.5* 3.3* --  CL 113* 114* --  CO2 21 21 --  GLUCOSE 136* 152* --  BUN 19 20 --  CREATININE 0.66 0.73 --  CALCIUM 9.2 9.2 --  MG 2.0 -- 1.9  PHOS -- -- --   Liver Function Tests:  Lab 03/21/11 0640 03/17/11 0421  AST 15 14  ALT 16 16  ALKPHOS 69 58  BILITOT 0.6 0.5  PROT 6.5 6.4  ALBUMIN 2.1* 2.3*    Lab 03/21/11 0640 03/18/11 0510  LIPASE 16 105*  AMYLASE -- --   No results found for this basename: AMMONIA:2 in the last 168 hours CBC:  Lab 03/21/11 0640 03/20/11 0452  WBC 17.7* 15.2*  NEUTROABS -- --  HGB 8.6* 8.8*  HCT 25.3* 26.3*  MCV 93.7 93.6  PLT 408* 496*   Cardiac Enzymes:  Lab 03/16/11 0425  CKTOTAL 88  CKMB 3.0  CKMBINDEX --  TROPONINI <0.30   BNP:  Lab 03/21/11 0640 03/15/11 2204  PROBNP 2930.0* 1621.0*   D-Dimer: No results found for this basename: DDIMER:2 in the last 168 hours CBG:  Lab 03/21/11 1112 03/21/11 0731 03/21/11 0431 03/21/11 0007 03/20/11 2117 03/20/11 1610  GLUCAP 134* 122* 130* 149* 149* 119*     Hemoglobin A1C: No results found for this basename: HGBA1C in the last 168 hours Fasting Lipid Panel: No results found for this basename: CHOL,HDL,LDLCALC,TRIG,CHOLHDL,LDLDIRECT in the last 829 hours Thyroid Function Tests: No results found for this basename: TSH,T4TOTAL,FREET4,T3FREE,THYROIDAB in the last 168 hours Coagulation: No results found for this basename: LABPROT:4,INR:4 in the last 168 hours Anemia Panel: No results found for this basename: VITAMINB12,FOLATE,FERRITIN,TIBC,IRON,RETICCTPCT in the last 168 hours Urine Drug Screen: Drugs of Abuse  No results found for this basename: labopia, cocainscrnur, labbenz, amphetmu, thcu, labbarb    Alcohol Level: No results found for this basename: ETH:2 in the last 168 hours Urinalysis: Results for FABRICE, DYAL (MRN [id number]) as of 03/21/2011 12:55  Ref. Range 03/21/2011 12:22  Color, Urine Latest Range: YELLOW  YELLOW  APPearance Latest Range: CLEAR  CLOUDY (A)  Specific Gravity, Urine Latest Range: 1.005-1.030  1.025  pH Latest Range: 5.0-8.0  6.0  Glucose, UA Latest Range: NEGATIVE mg/dL NEGATIVE  Bilirubin Urine Latest Range: NEGATIVE  NEGATIVE  Ketones, ur Latest Range: NEGATIVE mg/dL NEGATIVE  Protein Latest Range: NEGATIVE mg/dL 784 (A)  Urobilinogen, UA Latest Range: 0.0-1.0  mg/dL 0.2  Nitrite Latest Range: NEGATIVE  NEGATIVE  Leukocytes, UA Latest Range: NEGATIVE  NEGATIVE  WBC, UA Latest Range: <3 WBC/hpf 11-20  RBC / HPF Latest Range: <3 RBC/hpf TOO NUMEROUS TO COUNT  Bacteria, UA Latest Range: RARE  MANY (A)    Micro Results: Recent Results (from the past 240 hour(s))  CULTURE, BLOOD (ROUTINE X 2)     Status: Normal (Preliminary result)   Collection Time   03/21/11  7:52 AM      Component Value Range Status Comment   Specimen Description BLOOD LEFT HAND   Final    Special Requests     Final    Value: BOTTLES DRAWN AEROBIC AND ANAEROBIC 6CC EACH BOTTLE   Culture NO GROWTH <24 HRS   Final    Report Status  PENDING   Incomplete   CULTURE, BLOOD (ROUTINE X 2)     Status: Normal (Preliminary result)   Collection Time   03/21/11  8:50 AM      Component Value Range Status Comment   Specimen Description BLOOD LEFT FOREARM   Final    Special Requests     Final    Value: BOTTLES DRAWN AEROBIC AND ANAEROBIC 6CC EACH BOTTLE   Culture NO GROWTH <24 HRS   Final    Report Status PENDING   Incomplete    Studies/Results: Dg Swallowing Function  03/19/2011  *RADIOLOGY REPORT*  Clinical Data: Dysphagia, improved alertness  SWALLOWING FUNCTION STUDY:  Technique:  Patient swallowed varying consistencies of barium with the assistance of a speech pathologist.  I performed real-time fluoroscopic assessment in the lateral projection.  Fluoroscopy time:  3.0 minutes  Comparison:  None available  Findings: Incidentally noted dense atherosclerotic calcification within the carotid systems bilaterally.  With applesauce consistency, impaired oral phase was identified. Spillover of contrast to the vallecula occurred.  No laryngeal penetration or aspiration.  Oral residuals were noted.  With thin barium by teaspoon and cup, spillover of contrast to the vallecula was identified with delayed initiation of swallow. Laryngeal penetration was seen with both presentations, though aspiration was only identified with the cup swallow.  With nectar consistency by cup, laryngeal penetration was identified without aspiration.  With nectar consistency by straw, significant oral holding of contrast was identified.  This represented a large contrast volume and when swallowed, laryngeal penetration and aspiration were identified.  Additional presentation of nectar consistency by teaspoon demonstrated delayed initiation of swallow and laryngeal penetration without aspiration.  With honey consistency by teaspoon, spillover of contrast to the vallecula was identified without laryngeal penetration or aspiration.  Mild oral residual noted.  IMPRESSION:  Swallowing dysfunction as above.  Dense atherosclerotic calcifications noted at the carotid systems bilaterally; recommend carotid duplex sonography for further assessment.  Original Report Authenticated By: Lollie Marrow, M.D.   US Renal  03/20/2011  *RADIOLOGY REPORT*  Clinical Data: History of hematuria.  RENAL/URINARY TRACT ULTRASOUND COMPLETE  Comparison:  Ultrasound 03/16/2012.  Findings:  Right Kidney:  Right renal length is 9.9 cm.  No right renal cyst is evident. There is some increased echogenicity of the parenchyma of the right kidney.  Left Kidney:  Left renal length is 10.9 cm.  A left renal cyst is seen.  The cyst is in the upper pole and measures 2.0 x 1.8 x 1.8 cm.  It is unchanged from the previous study. The echogenicity of the left renal parenchyma appears normal.  Examination of each kidney shows no evidence of hydronephrosis, solid  mass,  calculus, or parenchymal loss.  Bladder:  Urinary bladder is decompressed by catheter.  IMPRESSION: Stable appearance of left renal cyst which appears simple. No evidence of hydronephrosis or solid renal mass.  No calculus is evident.  There is some increased echogenicity of the parenchyma of the right kidney.  This may be associated with medical renal disease.  Original Report Authenticated By: Crawford Givens, M.D.   Dg Chest Port 1 View  03/21/2011  *RADIOLOGY REPORT*  Clinical Data: Fever and dysphagia.  PORTABLE CHEST - 1 VIEW  Comparison: 03/18/2011  Findings: Right-sided PICC line is unchanged with tip at low SVC. Mild cardiomegaly. No pleural effusion or pneumothorax.  Patchy left greater than right lower lobe predominant airspace and possibly reticular nodular opacities.  Not significantly changed.  IMPRESSION: No change in right greater than left lower lobe predominant airspace disease.  Given clinical history, suspicious for aspiration or less likely infection.  Original Report Authenticated By: Consuello Bossier, M.D.   Scheduled Meds:   .  albuterol  2.5 mg Nebulization Q6H  . antiseptic oral rinse  1 application Mouth Rinse QID  . bisacodyl  10 mg Rectal QODAY  . ceFAZolin (ANCEF) IV  1 g Intravenous On Call  . chlorhexidine  15 mL Mouth/Throat BID  . fentaNYL  50 mcg Transdermal Q72H  . insulin aspart  0-9 Units Subcutaneous TID WC  . ipratropium  0.5 mg Nebulization Q6H  . meperidine      . midazolam      . pantoprazole (PROTONIX) IV  40 mg Intravenous Q24H  . potassium chloride  10 mEq Intravenous Q1 Hr x 4  . potassium chloride  20 mEq Oral BID  . sodium chloride  10 mL Intracatheter Q12H  . DISCONTD: aspirin  81 mg Oral Daily  . DISCONTD: furosemide  20 mg Intravenous Daily  . DISCONTD: hydrocortisone sod succinate (SOLU-CORTEF) injection  25 mg Intravenous Daily  . DISCONTD: insulin aspart  0-9 Units Subcutaneous Q4H  . DISCONTD: levofloxacin (LEVAQUIN) IV  750 mg Intravenous Q24H  . DISCONTD: metoprolol tartrate  12.5 mg Oral BID  . DISCONTD: potassium chloride  30 mEq Oral BID  . DISCONTD: predniSONE  15 mg Oral Q breakfast   Continuous Infusions:   . dextrose 5 % and 0.45 % NaCl with KCl 40 mEq/L 75 mL/hr at 03/20/11 1608  . DISCONTD: dextrose 5 % and 0.9 % NaCl with KCl 40 mEq/L 70 mL/hr at 03/20/11 0550  . DISCONTD: feeding supplement (JEVITY 1.2) 1,000 mL (03/17/11 1105)   PRN Meds:.metoprolol, ondansetron (ZOFRAN) IV, sodium chloride, DISCONTD: acetaminophen, DISCONTD: ALPRAZolam, DISCONTD: ondansetron Assessment/Plan: Principal Problem:  *Respiratory failure Active Problems:  CAP (community acquired pneumonia), pneumococcal  Toxic encephalopathy  Dehydration  Rhabdomyolysis  Dysphagia  Fever  Benign hypertension  Hypokalemia  Non-ST elevation MI (NSTEMI)  Pulmonary edema  Hyperlipidemia  GERD (gastroesophageal reflux disease)  Chronic pain  Anemia  Elevated troponin I level  Hypernatremia  Elevated LFTs  Thrombocytosis  Hyponatremia  Fecal impaction  Hyperglycemia  Gross  hematuria  Patient has been on antibiotics since admission. His most recent fever spike may be due to continued aspiration. Will make him he oh. The nurses report that he is, "tonguing" out all medications and food. Will stop Levaquin. It does not cover anaerobes. monitor white cell count and temperature curve. Leukocytosis could be related to steroids which I will stop. He's not wheezing currently. NG tube was unsuccessful. Await PEG tube. He does have some pyuria and bacteria,  but leukocyte esterase and nitrites are negative. Await culture results of the urine and blood. Change all medications to IV.  Check echocardiogram in the setting of possible non-ST segment elevation MI and pulmonary edema. Clinically no signs of heart failure despite elevated pro BNP. Patient actually appears somewhat volume depleted.   LOS: 18 days   Kayle Passarelli L 03/21/2011, 12:55 PM

## 2011-03-22 LAB — GLUCOSE, CAPILLARY
Glucose-Capillary: 93 mg/dL (ref 70–99)
Glucose-Capillary: 127 mg/dL — ABNORMAL HIGH (ref 70–99)
Glucose-Capillary: 138 mg/dL — ABNORMAL HIGH (ref 70–99)
Glucose-Capillary: 118 mg/dL — ABNORMAL HIGH (ref 70–99)
Glucose-Capillary: 132 mg/dL — ABNORMAL HIGH (ref 70–99)

## 2011-03-22 LAB — SURGICAL PCR SCREEN: MRSA, PCR: NEGATIVE

## 2011-03-22 LAB — BASIC METABOLIC PANEL
CO2: 22 mEq/L (ref 19–32)
Chloride: 115 mEq/L — ABNORMAL HIGH (ref 96–112)
Creatinine, Ser: 0.58 mg/dL (ref 0.50–1.35)
GFR calc Af Amer: >90 mL/min (ref >90)
Potassium: 2.4 mEq/L — CL (ref 3.5–5.1)

## 2011-03-22 MED ORDER — POTASSIUM CHLORIDE 10 MEQ/100ML IV SOLN
INTRAVENOUS | Status: AC
  Administered 2011-03-22: 08:00:00
  Filled 2011-03-22: qty 100

## 2011-03-22 MED ORDER — POTASSIUM CHLORIDE 10 MEQ/100ML IV SOLN
10.0000 meq | INTRAVENOUS | Status: AC
  Administered 2011-03-22 (×6): 10 meq via INTRAVENOUS
  Filled 2011-03-22 (×5): qty 100

## 2011-03-22 MED ORDER — ALBUTEROL SULFATE (5 MG/ML) 0.5% IN NEBU
2.5000 mg | INHALATION_SOLUTION | RESPIRATORY_TRACT | Status: DC | PRN
  Administered 2011-03-22: 2.5 mg via RESPIRATORY_TRACT

## 2011-03-22 MED ORDER — CLONIDINE HCL 0.1 MG/24HR TD PTWK
0.1000 mg | MEDICATED_PATCH | TRANSDERMAL | Status: DC
  Administered 2011-03-22: 0.1 mg via TRANSDERMAL
  Filled 2011-03-22: qty 1

## 2011-03-22 MED ORDER — POTASSIUM CHLORIDE 2 MEQ/ML IV SOLN
INTRAVENOUS | Status: DC
  Administered 2011-03-23 – 2011-03-27 (×4): via INTRAVENOUS
  Filled 2011-03-22 (×10): qty 1000

## 2011-03-22 NOTE — Progress Notes (Addendum)
Patient scheduled for procedure in am and was ordered barium to swallow for interventional radiology procedure.  Patient is unable to swallow and is at  High risk for aspirations.  NG tube attempted several times but was able to be advanced and patient did not tolerate procedure well.  Notified Dr. Orvan Falconer of patient being unable to take barium per previous order via mouth of ng tube.   Called radiology department at [facility], and the radiology tech, Lurena Joiner, talked to Dr. Denny Levy, and advised on having radiologist at [facility] to place NG tube with flouroscopy in AM and patient should be able to take barium that way and maybe be scheduled for g tube placement on Tuesday.  Will notify IR department at [facility] and [facility] of patient not being able to go for procedure on 03-23-11, and will notify am nurse of developments with patient.

## 2011-03-22 NOTE — Progress Notes (Signed)
Chart reviewed.  Subjective: Unable. Per nursing staff, no new issues.  Objective: Vital signs in last 24 hours: Filed Vitals:   03/22/11 0214 03/22/11 0537 03/22/11 0730 03/22/11 1307  BP:  181/79  192/85  Pulse:  90  91  Temp:  98.2 F (36.8 C)  99.1 F (37.3 C)  TempSrc:  Axillary  Axillary  Resp:  20  24  Height:      Weight:      SpO2: 94% 92% 94% 88%   Weight change:   Intake/Output Summary (Last 24 hours) at 03/22/11 1339 Last data filed at 03/22/11 1300  Gross per 24 hour  Intake      0 ml  Output   2150 ml  Net  -2150 ml   Physical Exam: General: Eyes open. Won't answer questions. HEENT: Dry mucous membranes Lungs: Rales, rhonchi Heart regular rate rhythm without murmurs gallops rubs Abdomen soft nontender nondistended Extremities no clubbing cyanosis or edema  Lab Results: Basic Metabolic Panel:  Lab 03/22/11 3086 03/21/11 0640 03/16/11 0425  NA 147* 144 --  K 2.4* 2.5* --  CL 115* 113* --  CO2 22 21 --  GLUCOSE 138* 136* --  BUN 18 19 --  CREATININE 0.58 0.66 --  CALCIUM 9.1 9.2 --  MG -- 2.0 1.9  PHOS -- -- --   Liver Function Tests:  Lab 03/21/11 0640 03/17/11 0421  AST 15 14  ALT 16 16  ALKPHOS 69 58  BILITOT 0.6 0.5  PROT 6.5 6.4  ALBUMIN 2.1* 2.3*    Lab 03/21/11 0640 03/18/11 0510  LIPASE 16 105*  AMYLASE -- --   No results found for this basename: AMMONIA:2 in the last 168 hours CBC:  Lab 03/21/11 0640 03/20/11 0452  WBC 17.7* 15.2*  NEUTROABS -- --  HGB 8.6* 8.8*  HCT 25.3* 26.3*  MCV 93.7 93.6  PLT 408* 496*   Cardiac Enzymes:  Lab 03/16/11 0425  CKTOTAL 88  CKMB 3.0  CKMBINDEX --  TROPONINI <0.30   BNP:  Lab 03/21/11 0640 03/15/11 2204  PROBNP 2930.0* 1621.0*   D-Dimer: No results found for this basename: DDIMER:2 in the last 168 hours CBG:  Lab 03/22/11 1136 03/22/11 0737 03/22/11 0350 03/21/11 2350 03/21/11 2015 03/21/11 1624  GLUCAP 138* 127* 104* 93 96 124*   Hemoglobin A1C: No results found  for this basename: HGBA1C in the last 168 hours Fasting Lipid Panel: No results found for this basename: CHOL,HDL,LDLCALC,TRIG,CHOLHDL,LDLDIRECT in the last 578 hours Thyroid Function Tests: No results found for this basename: TSH,T4TOTAL,FREET4,T3FREE,THYROIDAB in the last 168 hours Coagulation: No results found for this basename: LABPROT:4,INR:4 in the last 168 hours Anemia Panel: No results found for this basename: VITAMINB12,FOLATE,FERRITIN,TIBC,IRON,RETICCTPCT in the last 168 hours Urine Drug Screen: Drugs of Abuse  No results found for this basename: labopia,  cocainscrnur,  labbenz,  amphetmu,  thcu,  labbarb    Alcohol Level: No results found for this basename: ETH:2 in the last 168 hours Urinalysis: Results for Marcus [name] (MRN [id number]) as of 03/21/2011 12:55  Ref. Range 03/21/2011 12:22  Color, Urine Latest Range: YELLOW  YELLOW  APPearance Latest Range: CLEAR  CLOUDY (A)  Specific Gravity, Urine Latest Range: 1.005-1.030  1.025  pH Latest Range: 5.0-8.0  6.0  Glucose, UA Latest Range: NEGATIVE mg/dL NEGATIVE  Bilirubin Urine Latest Range: NEGATIVE  NEGATIVE  Ketones, ur Latest Range: NEGATIVE mg/dL NEGATIVE  Protein Latest Range: NEGATIVE mg/dL 413 (A)  Urobilinogen, UA Latest Range: 0.0-1.0 mg/dL 0.2  Nitrite Latest Range: NEGATIVE  NEGATIVE  Leukocytes, UA Latest Range: NEGATIVE  NEGATIVE  WBC, UA Latest Range: <3 WBC/hpf 11-20  RBC / HPF Latest Range: <3 RBC/hpf TOO NUMEROUS TO COUNT  Bacteria, UA Latest Range: RARE  MANY (A)    Micro Results: Recent Results (from the past 240 hour(s))  URINE CULTURE     Status: Normal   Collection Time   03/20/11  3:30 PM      Component Value Range Status Comment   Specimen Description URINE, CLEAN CATCH   Final    Special Requests NONE   Final    Setup Time [id number]   Final    Colony Count NO GROWTH   Final    Culture NO GROWTH   Final    Report Status 03/22/2011 FINAL   Final   CULTURE, BLOOD (ROUTINE X 2)      Status: Normal (Preliminary result)   Collection Time   03/21/11  7:52 AM      Component Value Range Status Comment   Specimen Description BLOOD LEFT HAND   Final    Special Requests     Final    Value: BOTTLES DRAWN AEROBIC AND ANAEROBIC 6CC EACH BOTTLE   Culture NO GROWTH 1 DAY   Final    Report Status PENDING   Incomplete   CULTURE, BLOOD (ROUTINE X 2)     Status: Normal (Preliminary result)   Collection Time   03/21/11  8:50 AM      Component Value Range Status Comment   Specimen Description BLOOD LEFT FOREARM   Final    Special Requests     Final    Value: BOTTLES DRAWN AEROBIC AND ANAEROBIC 6CC EACH BOTTLE   Culture NO GROWTH 1 DAY   Final    Report Status PENDING   Incomplete   SURGICAL PCR SCREEN     Status: Normal   Collection Time   03/22/11  6:45 AM      Component Value Range Status Comment   MRSA, PCR NEGATIVE  NEGATIVE  Final    Staphylococcus aureus NEGATIVE  NEGATIVE  Final    Studies/Results: US Renal  03/20/2011  *RADIOLOGY REPORT*  Clinical Data: History of hematuria.  RENAL/URINARY TRACT ULTRASOUND COMPLETE  Comparison:  Ultrasound 03/16/2012.  Findings:  Right Kidney:  Right renal length is 9.9 cm.  No right renal cyst is evident. There is some increased echogenicity of the parenchyma of the right kidney.  Left Kidney:  Left renal length is 10.9 cm.  A left renal cyst is seen.  The cyst is in the upper pole and measures 2.0 x 1.8 x 1.8 cm.  It is unchanged from the previous study. The echogenicity of the left renal parenchyma appears normal.  Examination of each kidney shows no evidence of hydronephrosis, solid  mass, calculus, or parenchymal loss.  Bladder:  Urinary bladder is decompressed by catheter.  IMPRESSION: Stable appearance of left renal cyst which appears simple. No evidence of hydronephrosis or solid renal mass.  No calculus is evident.  There is some increased echogenicity of the parenchyma of the right kidney.  This may be associated with medical renal  disease.  Original Report Authenticated By: Crawford Givens, M.D.   Dg Chest Port 1 View  03/21/2011  *RADIOLOGY REPORT*  Clinical Data: Fever and dysphagia.  PORTABLE CHEST - 1 VIEW  Comparison: 03/18/2011  Findings: Right-sided PICC line is unchanged with tip at low SVC. Mild cardiomegaly. No pleural effusion or pneumothorax.  Patchy left greater than right lower lobe predominant airspace and possibly reticular nodular opacities.  Not significantly changed.  IMPRESSION: No change in right greater than left lower lobe predominant airspace disease.  Given clinical history, suspicious for aspiration or less likely infection.  Original Report Authenticated By: Consuello Bossier, M.D.   Scheduled Meds:    . albuterol  2.5 mg Nebulization Q6H  . antiseptic oral rinse  1 application Mouth Rinse QID  . bisacodyl  10 mg Rectal QODAY  . ceFAZolin (ANCEF) IV  1 g Intravenous On Call  . chlorhexidine  15 mL Mouth/Throat BID  . fentaNYL  50 mcg Transdermal Q72H  . insulin aspart  0-9 Units Subcutaneous TID WC  . ipratropium  0.5 mg Nebulization Q6H  . pantoprazole (PROTONIX) IV  40 mg Intravenous Q24H  . potassium chloride  10 mEq Intravenous Q1 Hr x 6  . potassium chloride      . sodium chloride  10 mL Intracatheter Q12H  . DISCONTD: potassium chloride  20 mEq Oral BID   Continuous Infusions:    . dextrose 5 % and 0.45 % NaCl with KCl 40 mEq/[name] 75 mL/hr at 03/20/11 1608   PRN Meds:.metoprolol, ondansetron (ZOFRAN) IV, sodium chloride Assessment/Plan: Principal Problem:  *Respiratory failure Active Problems:  CAP (community acquired pneumonia), pneumococcal  Toxic encephalopathy  Dehydration  Rhabdomyolysis  Dysphagia  Fever  Benign hypertension  Hypokalemia  Non-ST elevation MI (NSTEMI)  Pulmonary edema  Hyperlipidemia  GERD (gastroesophageal reflux disease)  Chronic pain  Anemia  Elevated troponin I level  Hypernatremia  Elevated LFTs  Thrombocytosis  Hypernatremia  Fecal impaction   Hyperglycemia  Gross hematuria  Blood pressure is high. Will add clonidine patch. Change fluid to hypotonic. No fevers off antibiotics. Await PEG tube.   LOS: 19 days   Marcus [name] 03/22/2011, 1:39 PM

## 2011-03-23 ENCOUNTER — Ambulatory Visit (HOSPITAL_COMMUNITY): Admit: 2011-03-23 | Payer: Medicare Other

## 2011-03-23 DIAGNOSIS — I517 Cardiomegaly: Secondary | ICD-10-CM

## 2011-03-23 LAB — GLUCOSE, CAPILLARY
Glucose-Capillary: 132 mg/dL — ABNORMAL HIGH (ref 70–99)
Glucose-Capillary: 164 mg/dL — ABNORMAL HIGH (ref 70–99)

## 2011-03-23 LAB — PROTIME-INR
INR: 1.62 — ABNORMAL HIGH (ref 0.00–1.49)
Prothrombin Time: 19.5 seconds — ABNORMAL HIGH (ref 11.6–15.2)

## 2011-03-23 LAB — CBC
HCT: 25.6 % — ABNORMAL LOW (ref 39.0–52.0)
MCHC: 32.8 g/dL (ref 30.0–36.0)
Platelets: 359 10*3/uL (ref 150–400)
RDW: 14.2 % (ref 11.5–15.5)
WBC: 12.9 10*3/uL — ABNORMAL HIGH (ref 4.0–10.5)

## 2011-03-23 LAB — BASIC METABOLIC PANEL
CO2: 22 mEq/L (ref 19–32)
Calcium: 8.8 mg/dL (ref 8.4–10.5)
Creatinine, Ser: 0.58 mg/dL (ref 0.50–1.35)
GFR calc Af Amer: >90 mL/min (ref >90)
GFR calc non Af Amer: >90 mL/min (ref >90)
Sodium: 147 mEq/L — ABNORMAL HIGH (ref 135–145)

## 2011-03-23 LAB — CULTURE, BLOOD (ROUTINE X 2): Culture: NO GROWTH

## 2011-03-23 MED ORDER — SODIUM CHLORIDE 0.9 % IJ SOLN
INTRAMUSCULAR | Status: AC
  Filled 2011-03-23: qty 3

## 2011-03-23 MED ORDER — POTASSIUM CHLORIDE 10 MEQ/100ML IV SOLN
INTRAVENOUS | Status: AC
  Filled 2011-03-23: qty 100

## 2011-03-23 MED ORDER — VITAMIN K1 10 MG/ML IJ SOLN
10.0000 mg | Freq: Once | INTRAMUSCULAR | Status: AC
  Administered 2011-03-23: 10 mg via SUBCUTANEOUS
  Filled 2011-03-23: qty 1

## 2011-03-23 MED ORDER — POTASSIUM CHLORIDE 10 MEQ/100ML IV SOLN
10.0000 meq | INTRAVENOUS | Status: AC
  Administered 2011-03-23 (×6): 10 meq via INTRAVENOUS
  Filled 2011-03-23 (×5): qty 100

## 2011-03-23 NOTE — Progress Notes (Signed)
PROGRESS/TRANSFER NOTE  Subjective: Unable. Per nursing staff, no new issues. Again, they were unable to place an NG tube for barium 24 hours prior to the PEG tube with interventional radiology.  Objective: Vital signs in last 24 hours: Filed Vitals:   03/22/11 2332 03/23/11 0303 03/23/11 0607 03/23/11 0812  BP:   134/63   Pulse:   93   Temp:   98.2 F (36.8 C)   TempSrc:   Axillary   Resp:   20   Height:      Weight:      SpO2: 88% 94% 97% 93%   Weight change:   Intake/Output Summary (Last 24 hours) at 03/23/11 1236 Last data filed at 03/23/11 1228  Gross per 24 hour  Intake    400 ml  Output   1600 ml  Net  -1200 ml   Physical Exam: General: Eyes open. Won't answer questions. HEENT: Dry mucous membranes Lungs: Clear to auscultation without wheezes rhonchi or rales Heart regular rate rhythm without murmurs gallops rubs Abdomen soft nontender nondistended Extremities no clubbing cyanosis or edema  Lab Results: Basic Metabolic Panel:  Lab 03/23/11 1191 03/22/11 0611 03/21/11 0640  NA 147* 147* --  K 2.8* 2.4* --  CL 116* 115* --  CO2 22 22 --  GLUCOSE 196* 138* --  BUN 16 18 --  CREATININE 0.58 0.58 --  CALCIUM 8.8 9.1 --  MG -- -- 2.0  PHOS -- -- --   Liver Function Tests:  Lab 03/21/11 0640 03/17/11 0421  AST 15 14  ALT 16 16  ALKPHOS 69 58  BILITOT 0.6 0.5  PROT 6.5 6.4  ALBUMIN 2.1* 2.3*    Lab 03/21/11 0640 03/18/11 0510  LIPASE 16 105*  AMYLASE -- --   No results found for this basename: AMMONIA:2 in the last 168 hours CBC:  Lab 03/23/11 0459 03/21/11 0640  WBC 12.9* 17.7*  NEUTROABS -- --  HGB 8.4* 8.6*  HCT 25.6* 25.3*  MCV 93.8 93.7  PLT 359 408*   Cardiac Enzymes: No results found for this basename: CKTOTAL:3,CKMB:3,CKMBINDEX:3,TROPONINI:3 in the last 168 hours BNP:  Lab 03/21/11 0640  PROBNP 2930.0*   D-Dimer: No results found for this basename: DDIMER:2 in the last 168 hours CBG:  Lab 03/23/11 1115 03/23/11 0721  03/23/11 0412 03/23/11 0003 03/22/11 2007 03/22/11 1613  GLUCAP 137* 164* 176* 132* 118* 132*   Hemoglobin A1C: No results found for this basename: HGBA1C in the last 168 hours Fasting Lipid Panel: No results found for this basename: CHOL,HDL,LDLCALC,TRIG,CHOLHDL,LDLDIRECT in the last 478 hours Thyroid Function Tests: No results found for this basename: TSH,T4TOTAL,FREET4,T3FREE,THYROIDAB in the last 168 hours Coagulation:  Lab 03/23/11 0459  LABPROT 19.5*  INR 1.62*   Anemia Panel: No results found for this basename: VITAMINB12,FOLATE,FERRITIN,TIBC,IRON,RETICCTPCT in the last 168 hours Urine Drug Screen: Drugs of Abuse  No results found for this basename: labopia,  cocainscrnur,  labbenz,  amphetmu,  thcu,  labbarb    Alcohol Level: No results found for this basename: ETH:2 in the last 168 hours Urinalysis: Results for MERTON, WADLOW (MRN [id number]) as of 03/21/2011 12:55  Ref. Range 03/21/2011 12:22  Color, Urine Latest Range: YELLOW  YELLOW  APPearance Latest Range: CLEAR  CLOUDY (A)  Specific Gravity, Urine Latest Range: 1.005-1.030  1.025  pH Latest Range: 5.0-8.0  6.0  Glucose, UA Latest Range: NEGATIVE mg/dL NEGATIVE  Bilirubin Urine Latest Range: NEGATIVE  NEGATIVE  Ketones, ur Latest Range: NEGATIVE mg/dL NEGATIVE  Protein Latest Range:  NEGATIVE mg/dL 409 (A)  Urobilinogen, UA Latest Range: 0.0-1.0 mg/dL 0.2  Nitrite Latest Range: NEGATIVE  NEGATIVE  Leukocytes, UA Latest Range: NEGATIVE  NEGATIVE  WBC, UA Latest Range: <3 WBC/hpf 11-20  RBC / HPF Latest Range: <3 RBC/hpf TOO NUMEROUS TO COUNT  Bacteria, UA Latest Range: RARE  MANY (A)    Micro Results: Recent Results (from the past 240 hour(s))  URINE CULTURE     Status: Normal   Collection Time   03/20/11  3:30 PM      Component Value Range Status Comment   Specimen Description URINE, CLEAN CATCH   Final    Special Requests NONE   Final    Setup Time [id number]   Final    Colony Count NO GROWTH   Final      Culture NO GROWTH   Final    Report Status 03/22/2011 FINAL   Final   CULTURE, BLOOD (ROUTINE X 2)     Status: Normal (Preliminary result)   Collection Time   03/21/11  7:52 AM      Component Value Range Status Comment   Specimen Description BLOOD LEFT HAND   Final    Special Requests     Final    Value: BOTTLES DRAWN AEROBIC AND ANAEROBIC 6CC EACH BOTTLE   Culture NO GROWTH 2 DAYS   Final    Report Status PENDING   Incomplete   CULTURE, BLOOD (ROUTINE X 2)     Status: Normal (Preliminary result)   Collection Time   03/21/11  8:50 AM      Component Value Range Status Comment   Specimen Description BLOOD LEFT FOREARM   Final    Special Requests     Final    Value: BOTTLES DRAWN AEROBIC AND ANAEROBIC 6CC EACH BOTTLE   Culture NO GROWTH 2 DAYS   Final    Report Status PENDING   Incomplete   SURGICAL PCR SCREEN     Status: Normal   Collection Time   03/22/11  6:45 AM      Component Value Range Status Comment   MRSA, PCR NEGATIVE  NEGATIVE  Final    Staphylococcus aureus NEGATIVE  NEGATIVE  Final    Studies/Results: No results found. Scheduled Meds:    . albuterol  2.5 mg Nebulization Q6H  . antiseptic oral rinse  1 application Mouth Rinse QID  . bisacodyl  10 mg Rectal QODAY  . ceFAZolin (ANCEF) IV  1 g Intravenous On Call  . chlorhexidine  15 mL Mouth/Throat BID  . cloNIDine  0.1 mg Transdermal Weekly  . fentaNYL  50 mcg Transdermal Q72H  . insulin aspart  0-9 Units Subcutaneous TID WC  . ipratropium  0.5 mg Nebulization Q6H  . pantoprazole (PROTONIX) IV  40 mg Intravenous Q24H  . phytonadione  10 mg Subcutaneous Once  . potassium chloride  10 mEq Intravenous Q1 Hr x 6  . potassium chloride  10 mEq Intravenous Q1 Hr x 6  . sodium chloride  10 mL Intracatheter Q12H  . sodium chloride      . sodium chloride       Continuous Infusions:    . dextrose 5 % with kcl 50 mL/hr at 03/23/11 1027  . DISCONTD: dextrose 5 % and 0.45 % NaCl with KCl 40 mEq/L 75 mL/hr at 03/20/11  1608    PRN Meds:.albuterol, metoprolol, ondansetron (ZOFRAN) IV, sodium chloride Assessment/Plan:   Ventilator dependent Respiratory failure secondary to pneumococcal pneumonia and sepsis, status post  extubation last week. Patient has been treated with a full course of broad-spectrum antibiotics. He has tachypnea which appears to be centrally mediated but has otherwise been stable from a pulmonary perspective.   CAP (community acquired pneumonia), pneumococcal   Toxic encephalopathy: Patient initial presentation was one of altered mental status felt to be related to the infection. Since then, he remains poorly interactive. Neurology has been consulted. EEG has been done. 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   Dehydration : Present on admission.   Rhabdomyolysis: Present on admission. He also had a spike in his troponin after discharge and may have had a non-ST segment elevation MI. Echocardiogram is pending.   Dysphagia, severe, with recurrent aspiration: Patient had been on tube feeds since admission until extubation. However, Patient unfortunately pulled out his NG tube a few days ago, and it was unable to be replaced. He really is taking nothing by mouth and tongues out any food or pills. He did have some fevers which may have been related to aspiration based on chest x-ray. He has been fever free for several days now however off antibiotics. GI was consulted and attempted PEG tube, but his anatomy was such that the procedure was aborted and it was recommended that he have a PEG tube placed by interventional radiology. We attempted to get an NG tube down again, but were unsuccessful. Interventional radiology recommends placement of an NG tube under fluoroscopy at [facility] 24 hours prior to PEG tube insertion. I've spoken with the hospitalist to  arrange transfer.   Benign hypertension: A clonidine patch has been placed and patient is getting IV metoprolol as needed.   Hypokalemia: Patient has received daily repletion IV for several days now and his potassium is slowly increasing.   Probable Non-ST elevation MI (NSTEMI) in the setting of sepsis, respiratory failure and acute illness: Echocardiogram needs to be followed up. He is not at this time stable for intervention. If the echocardiogram shows significant wall motion abnormalities or systolic dysfunction, this can be done as an outpatient if patient's clinical status warrants aggressive treatment.   Pulmonary edema: Patient had pulmonary edema while intubated and required several doses of Lasix. He has no evidence of fluid overload at the time of transfer.   Hyperlipidemia   GERD (gastroesophageal reflux disease)   Chronic pain: Patient had been on oral and transdermal opiates prior to admission. He is back on a fentanyl patch which seems to have helped his tachypnea.   Anemia   Hypernatremia: Patient is currently getting hypotonic IV fluids.   Elevated LFTs: Due to shock liver   Fecal impaction, resolved   Hyperglycemia: Related to steroids. Upon admission, patient was placed on high-dose Solu-Medrol by the [facility] physician which have since been tapered off.   Gross hematuria: Ultrasound of the kidneys showed cysts but no malignancy  Interventional radiology recommends placing an NG tube under fluoroscopy, then waiting 24 hours then placing the PEG tube. Unfortunately, radiology does not have the capacity to place NG tube under fluoroscopy here at [facility]. Other than hypokalemia, the patient is otherwise stable for transfer to skilled nursing facility after her electrolytes are corrected and PEG tube is placed and he is tolerating PEG tube feeds. He already has skilled nursing facility bed at of on today nursing home in [facility]. He can be  transferred to [facility] for the two-step procedure then transferred to the nursing home if otherwise stable. He is receiving further IV potassium repletion. His magnesium has been normal. I will also give him a dose of vitamin K 10 mg subcutaneously for an INR of 1.6. This is most likely related to antibiotics and malnutrition. I will order repeat BMET, CBC, INR in the morning. Dr. Virginia Rochester has kindly accepted the patient in transfer. He requests that patient be placed on triad hospitalist team 1, under Dr. Salvatore Decent care.  I've spoken with the patient's daughter to inform her of the plans and she is agreeable. Total time 60 minutes.  LOS: 20 days   Ziyonna Christner L 03/23/2011, 12:36 PM

## 2011-03-23 NOTE — Progress Notes (Signed)
03/23/11 1526 patient being transferred to [facility], room 5506-01. Report given to carelink nurse. Spoke with patients daughter, penny regarding time of transfer as she requested. Pt denies pain or discomfort at this time. Pt awaiting carelink transport to [place]. Nursing to monitor.

## 2011-03-23 NOTE — Progress Notes (Signed)
*  PRELIMINARY RESULTS* Echocardiogram 2D Echocardiogram has been performed.  Conrad Clearview 03/23/2011, 10:14 AM

## 2011-03-23 NOTE — Progress Notes (Addendum)
Pt to transfer to [facility] today.  Suzanna, [facility] CSW, notified of bed offer and APS involvement.  CSW left message for Graciella Belton at [facility] DSS to update her on weekend concern.  Awaiting return call. Suzanna given Dianne's contact information and is also aware and will follow up as needed.    Marcus [name]

## 2011-03-23 NOTE — Progress Notes (Signed)
03/23/11 1609 patient left floor with carelink transport for transfer to [place]. Called report to Ginger, receiving nurse at [facility] unit [id number].

## 2011-03-23 NOTE — Progress Notes (Signed)
Marcus [name] [id number] Admission Data: 03/23/2011 5:50 PM Attending Provider: Kela Millin  WUJ:WJXBJ,YNWGN, PA, PA Consults/ Treatment Team:    Marcus [name] is a 71 y.o. male patient transferred via EMS awake, alert  & orientated  X 1,  Full Code, VSS - Blood pressure 151/68, pulse 85, temperature 98 F (36.7 C), temperature source Oral, resp. rate 18, height 5\' 6"  (1.676 m), weight 57.3 kg (126 lb 5.2 oz), SpO2 94.00%., , no c/o shortness of breath, no c/o chest pain, no distress noted.   IV site WDL:  upper arm right, condition patent and no redness with a transparent dsg that's clean dry and intact.  Allergies:  No Known Allergies   Past Medical History  Diagnosis Date  . Hypertension   . GERD (gastroesophageal reflux disease)   . Hypercholesteremia   . Stroke   . Anemia 03/06/2011  . Smoker   . Non-ST elevation MI (NSTEMI) 03/07/2011    With PNA and resp failure  . Fecal impaction 03/17/2011  . 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  Pt orientation to unit, room and routine.Armband ID verified with patient and in place. Assessment complete with patient. Pt nodded an understanding of how to use the call bell and to call for help before getting out of bed.  Skin has R elbow scabs, buttocks red but blanching, and DTI on R heel.   Will cont to monitor and assist as needed.  Marcus [name] Marcus Lose, RN 03/23/2011 5:50 PM

## 2011-03-23 NOTE — Progress Notes (Signed)
Physical Therapy Treatment Patient Details Name: Marcus [name] MRN: [id number] DOB: [birth year] Today's Date: 03/23/2011  TIME: 912-933/ TE  PT Assessment/Plan  PT - Assessment/Plan Comments on Treatment Session: Pt needed AAROM and even PROM during bed exercises and was total assist for sitting EOB. Once at EOB pt was able to  maintain balance for a short periods of time during AAROM PNF exercises before wanting to "fall"  back and to either side; Pt sat EOB with Max A 6 mins PT Goals  Acute Rehab PT Goals PT Goal: Rolling Supine to Right Side - Progress: Progressing toward goal PT Goal: Sit at Edge Of Bed - Progress: Met  PT Treatment Precautions/Restrictions  Precautions Precautions: Fall Required Braces or Orthoses: No Restrictions Weight Bearing Restrictions: No Mobility (including Balance) Bed Mobility Rolling Right: 2: Max assist Supine to Sit: 1: +1 Total assist Supine to Sit Details (indicate cue type and reason): Pt does pull forward with abdominals to lift head and trunk up off of bed as if to sit up. Sit to Supine: 1: +1 Total assist Transfers Transfers: Yes (nursing needed pt to stand for resetting bed) Sit to Stand: 1: +1 Total assist Stand to Sit: 1: +1 Total assist Ambulation/Gait Ambulation/Gait: No Stairs: No Wheelchair Mobility Wheelchair Mobility: No    Exercise  General Exercises - Lower Extremity Ankle Circles/Pumps: PROM;AAROM;Both;10 reps Heel Slides: AAROM;Both;10 reps Hip ABduction/ADduction: AAROM;Both;10 reps Other Exercises Other Exercises: AAROM D1/D2 PNF UE exercises at EOB x10 each pattern End of Session PT - End of Session Equipment Utilized During Treatment: Gait belt Activity Tolerance: Patient limited by fatigue;Treatment limited secondary to medical complications (Comment) (extreme weakness) Patient left:  (nursing present) Nurse Communication: Mobility status for transfers;Mobility status for ambulation General Behavior  During Session: [facility] for tasks performed Cognition: [facility] for tasks performed  Marcus [name] ATKINSO 03/23/2011, 9:48 AM

## 2011-03-24 ENCOUNTER — Inpatient Hospital Stay (HOSPITAL_COMMUNITY): Payer: Medicare Other

## 2011-03-24 LAB — GLUCOSE, CAPILLARY
Glucose-Capillary: 103 mg/dL — ABNORMAL HIGH (ref 70–99)
Glucose-Capillary: 104 mg/dL — ABNORMAL HIGH (ref 70–99)
Glucose-Capillary: 98 mg/dL (ref 70–99)
Glucose-Capillary: 99 mg/dL (ref 70–99)

## 2011-03-24 LAB — BASIC METABOLIC PANEL
CO2: 20 mEq/L (ref 19–32)
Calcium: 8.9 mg/dL (ref 8.4–10.5)
Creatinine, Ser: 0.52 mg/dL (ref 0.50–1.35)
GFR calc non Af Amer: >90 mL/min (ref >90)
Sodium: 145 mEq/L (ref 135–145)

## 2011-03-24 LAB — PROTIME-INR: INR: 1.23 (ref 0.00–1.49)

## 2011-03-24 NOTE — Progress Notes (Signed)
Subjective:  Chart reviewed, pt is a 71yo WM admitted to [facility] on 12/25 with AMS found to have Pneumonia/sepsis>VDRF, extubated last week,  also with probable NSTEMI. He was transferred to [facility] 1/14 for NG placement under fluoro>barium>PEG per IR  - pt mutters ok, when asked about his breathing, o/w does not answer any other questions. Objective: Vital signs in last 24 hours: Filed Vitals:   03/24/11 0540 03/24/11 0807 03/24/11 1404 03/24/11 1543  BP: 153/70   155/70  Pulse: 72 75 75 71  Temp: 98.2 F (36.8 C)   97.9 F (36.6 C)  TempSrc: Oral     Resp: 20 20 20 19  Height:      Weight:      SpO2: 94% 95% 89% 91%   Weight change:   Intake/Output Summary (Last 24 hours) at 03/24/11 1722 Last data filed at 03/24/11 1440  Gross per 24 hour  Intake 578.33 ml  Output   1475 ml  Net -896.67 ml   Physical Exam: General: Alert, mostly nonverbal, attempts to answer sometimes but hard to understand.. HEENT: Dry mucous membranes Lungs: moderate air mov't, rhonchi present without wheezes  Heart regular rate rhythm without murmurs gallops rubs Abdomen soft nontender nondistended Extremities no clubbing cyanosis or edema  Lab Results: Basic Metabolic Panel:  Lab 03/24/11 0530 03/23/11 0459 03/21/11 0640  NA 145 147* --  K 3.1* 2.8* --  CL 110 116* --  CO2 20 22 --  GLUCOSE 93 196* --  BUN 16 16 --  CREATININE 0.52 0.58 --  CALCIUM 8.9 8.8 --  MG -- -- 2.0  PHOS -- -- --   Liver Function Tests:  Lab 03/21/11 0640  AST 15  ALT 16  ALKPHOS 69  BILITOT 0.6  PROT 6.5  ALBUMIN 2.1*    Lab 03/21/11 0640 03/18/11 0510  LIPASE 16 105*  AMYLASE -- --   No results found for this basename: AMMONIA:2 in the last 168 hours CBC:  Lab 03/23/11 0459 03/21/11 0640  WBC 12.9* 17.7*  NEUTROABS -- --  HGB 8.4* 8.6*  HCT 25.6* 25.3*  MCV 93.8 93.7  PLT 359 408*   Cardiac Enzymes: No results found for this basename: CKTOTAL:3,CKMB:3,CKMBINDEX:3,TROPONINI:3 in the last  168 hours BNP:  Lab 03/21/11 0640  PROBNP 2930.0*   D-Dimer: No results found for this basename: DDIMER:2 in the last 168 hours CBG:  Lab 03/24/11 1708 03/24/11 1201 03/24/11 0803 03/24/11 0051 03/23/11 1734 03/23/11 1115  GLUCAP 104* 103* 99 98 115* 137*   Hemoglobin A1C: No results found for this basename: HGBA1C in the last 168 hours Fasting Lipid Panel: No results found for this basename: CHOL,HDL,LDLCALC,TRIG,CHOLHDL,LDLDIRECT in the last 168 hours Thyroid Function Tests: No results found for this basename: TSH,T4TOTAL,FREET4,T3FREE,THYROIDAB in the last 168 hours Coagulation:  Lab 03/24/11 1124 03/23/11 0459  LABPROT 15.8* 19.5*  INR 1.23 1.62*   Anemia Panel: No results found for this basename: VITAMINB12,FOLATE,FERRITIN,TIBC,IRON,RETICCTPCT in the last 168 hours Urine Drug Screen: Drugs of Abuse  No results found for this basename: labopia,  cocainscrnur,  labbenz,  amphetmu,  thcu,  labbarb    Alcohol Level: No results found for this basename: ETH:2 in the last 168 hours Urinalysis: Results for Murfin, Braden (MRN [id number]) as of 03/21/2011 12:55  Ref. Range 03/21/2011 12:22  Color, Urine Latest Range: YELLOW  YELLOW  APPearance Latest Range: CLEAR  CLOUDY (A)  Specific Gravity, Urine Latest Range: 1.005-1.030  1.025  pH Latest Range: 5.0-8.0  6.0  Glucose,   UA Latest Range: NEGATIVE mg/dL NEGATIVE  Bilirubin Urine Latest Range: NEGATIVE  NEGATIVE  Ketones, ur Latest Range: NEGATIVE mg/dL NEGATIVE  Protein Latest Range: NEGATIVE mg/dL 100 (A)  Urobilinogen, UA Latest Range: 0.0-1.0 mg/dL 0.2  Nitrite Latest Range: NEGATIVE  NEGATIVE  Leukocytes, UA Latest Range: NEGATIVE  NEGATIVE  WBC, UA Latest Range: <3 WBC/hpf 11-20  RBC / HPF Latest Range: <3 RBC/hpf TOO NUMEROUS TO COUNT  Bacteria, UA Latest Range: RARE  MANY (A)    Micro Results: Recent Results (from the past 240 hour(s))  URINE CULTURE     Status: Normal   Collection Time   03/20/11  3:30 PM       Component Value Range Status Comment   Specimen Description URINE, CLEAN CATCH   Final    Special Requests NONE   Final    Setup Time [id number]   Final    Colony Count NO GROWTH   Final    Culture NO GROWTH   Final    Report Status 03/22/2011 FINAL   Final   CULTURE, BLOOD (ROUTINE X 2)     Status: Normal (Preliminary result)   Collection Time   03/21/11  7:52 AM      Component Value Range Status Comment   Specimen Description BLOOD LEFT HAND   Final    Special Requests     Final    Value: BOTTLES DRAWN AEROBIC AND ANAEROBIC 6CC EACH BOTTLE   Culture NO GROWTH 3 DAYS   Final    Report Status PENDING   Incomplete   CULTURE, BLOOD (ROUTINE X 2)     Status: Normal (Preliminary result)   Collection Time   03/21/11  8:50 AM      Component Value Range Status Comment   Specimen Description BLOOD LEFT FOREARM   Final    Special Requests     Final    Value: BOTTLES DRAWN AEROBIC AND ANAEROBIC 6CC EACH BOTTLE   Culture NO GROWTH 3 DAYS   Final    Report Status PENDING   Incomplete   SURGICAL PCR SCREEN     Status: Normal   Collection Time   03/22/11  6:45 AM      Component Value Range Status Comment   MRSA, PCR NEGATIVE  NEGATIVE  Final    Staphylococcus aureus NEGATIVE  NEGATIVE  Final    Studies/Results: No results found.  2D ECHO Study Conclusions  - Left ventricle: The cavity size was normal. There was mild concentric hypertrophy. Systolic function was normal. The estimated ejection fraction was in the range of 60% to 65%. Cannot exclude hypokinesis of a small segment at the base of the inferior wall. - Aortic valve: Mildly calcified annulus. - Mitral valve: Calcified annulus. - Left atrium: The atrium was moderately dilated. - Right ventricle: The cavity size was normal. Wall thickness was mildly increased. - Right atrium: The atrium was mildly dilated. - Atrial septum: No defect or patent foramen ovale was identified.     Scheduled Meds:    . albuterol  2.5 mg  Nebulization Q6H  . antiseptic oral rinse  1 application Mouth Rinse QID  . bisacodyl  10 mg Rectal QODAY  . ceFAZolin (ANCEF) IV  1 g Intravenous On Call  . chlorhexidine  15 mL Mouth/Throat BID  . cloNIDine  0.1 mg Transdermal Weekly  . fentaNYL  50 mcg Transdermal Q72H  . insulin aspart  0-9 Units Subcutaneous TID WC  . ipratropium  0.5 mg Nebulization Q6H  .   pantoprazole (PROTONIX) IV  40 mg Intravenous Q24H  . potassium chloride      . sodium chloride  10 mL Intracatheter Q12H  . sodium chloride       Continuous Infusions:    . dextrose 5 % with kcl 50 mL/hr at 03/24/11 0613    PRN Meds:.albuterol, metoprolol, ondansetron (ZOFRAN) IV, sodium chloride Assessment/Plan: Dysphagia, severe, with recurrent aspiration:  Discussed pt with IR today and they state his INR needs to be 1.5 or less, and that leukocytosis must be resolved prior to procedure - Pt's INR IS 1.23,  Will recheck cbc - radiology also requested re-entry of orders for NG under fluoro for barium and that once this is done- earliest tomorrow, then PEG placement will follow in 24hrs    Pt was admitted at [facility] on 12/25 and was there through 1/14 when she was tranferred to [facility] for NG tube placement under fluoro per radiology prior to PEG also to be placed per radiology. Pleaae see Dr Sullivan's transfer/progress note of 12/14 for details.   Ventilator dependent Respiratory failure secondary to pneumococcal pneumonia and sepsis,  resolved, status post extubation last week. Patient has been treated with a full course of broad-spectrum antibiotics. He has tachypnea which appears to be centrally mediated but has otherwise been stable from a pulmonary perspective.   CAP (community acquired pneumonia), probable pneumococcal - as above pt is s/p completion of abx   Toxic encephalopathy: Pt alert but remains poorly interactive. - felt to be related to the infection. 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        Benign hypertension: continue clonidine patch has been placed and  IV metoprolol as needed.   Hypokalemia: will replace k   Probable Non-ST elevation MI (NSTEMI) in the setting of sepsis, respiratory failure and acute illness: - Echocardiogram as above EF 60-65% cannot exclude hypokinesis of small segment of inferior.  - please see Dr Sullivan's note of 1/14 - follow as continues to improve consider inpatient vs outpt cards consult.   Pulmonary edema:resolved, monitor fluid status and further treat accordingly  -Patient had pulmonary edema while intubated and required several doses of Lasix. He has no evidence of fluid overload at the time of transfer.  GERD (gastroesophageal reflux disease)   Chronic pain: Patient  on a fentanyl patch    Anemia: stable   Hypernatremia: resolved with hypotonic IV fluids.   Elevated LFTs:  Resloved, Due to shock liver   Fecal impaction, resolved   Hyperglycemia: Related to steroids. Upon admission, patient was placed on high-dose Solu-Medrol by the [facility] physician which have since been tapered off.   Gross hematuria: Ultrasound of the kidneys showed cysts but no malignancy   I LOS: 21 days   Chas Axel C 03/24/2011, 5:22 PM  

## 2011-03-24 NOTE — Progress Notes (Signed)
Pt transferred to [facility] from [facility]. Jeani Hawking CSW notified this CSW that pt has bed offer at [facility] [place] and pt dtr agreeable. Clinical Child psychotherapist received message from Coralyn Helling at [facility] DSS and Clinical Social Worker contacted Coralyn Helling and left message in order to update on a weekend concern from [facility]. Clinical Social Worker to follow up with [employer] and facilitate pt discharge needs to [facility] [place] when pt medically ready for discharge.   Jacklynn Lewis, MSW, LCSWA  Clinical Social Work [phone]

## 2011-03-25 ENCOUNTER — Inpatient Hospital Stay (HOSPITAL_COMMUNITY): Payer: Medicare Other

## 2011-03-25 LAB — GLUCOSE, CAPILLARY
Glucose-Capillary: 96 mg/dL (ref 70–99)
Glucose-Capillary: 106 mg/dL — ABNORMAL HIGH (ref 70–99)
Glucose-Capillary: 107 mg/dL — ABNORMAL HIGH (ref 70–99)

## 2011-03-25 LAB — CBC
MCH: 30 pg (ref 26.0–34.0)
MCHC: 32.4 g/dL (ref 30.0–36.0)
MCV: 92.7 fL (ref 78.0–100.0)
Platelets: 364 10*3/uL (ref 150–400)
RDW: 13.9 % (ref 11.5–15.5)

## 2011-03-25 LAB — BASIC METABOLIC PANEL
Calcium: 8.9 mg/dL (ref 8.4–10.5)
Creatinine, Ser: 0.56 mg/dL (ref 0.50–1.35)
GFR calc Af Amer: >90 mL/min (ref >90)
GFR calc non Af Amer: >90 mL/min (ref >90)

## 2011-03-25 MED ORDER — CEFAZOLIN SODIUM 1-5 GM-% IV SOLN
1.0000 g | INTRAVENOUS | Status: AC
  Administered 2011-03-25: 1 g via INTRAVENOUS
  Filled 2011-03-25: qty 50

## 2011-03-25 MED ORDER — POTASSIUM CHLORIDE 10 MEQ/100ML IV SOLN
10.0000 meq | INTRAVENOUS | Status: AC
  Filled 2011-03-25 (×3): qty 100

## 2011-03-25 NOTE — Progress Notes (Signed)
Utilization review complete 

## 2011-03-25 NOTE — H&P (View-Only) (Signed)
Subjective:  Chart reviewed, pt is a 70yo WM admitted to [facility] on 12/25 with AMS found to have Pneumonia/sepsis>VDRF, extubated last week,  also with probable NSTEMI. He was transferred to [facility] 1/14 for NG placement under fluoro>barium>PEG per IR  - pt mutters ok, when asked about his breathing, o/w does not answer any other questions. Objective: Vital signs in last 24 hours: Filed Vitals:   03/24/11 0540 03/24/11 0807 03/24/11 1404 03/24/11 1543  BP: 153/70   155/70  Pulse: 72 75 75 71  Temp: 98.2 F (36.8 C)   97.9 F (36.6 C)  TempSrc: Oral     Resp: 20 20 20 19   Height:      Weight:      SpO2: 94% 95% 89% 91%   Weight change:   Intake/Output Summary (Last 24 hours) at 03/24/11 1722 Last data filed at 03/24/11 1440  Gross per 24 hour  Intake 578.33 ml  Output   1475 ml  Net -896.67 ml   Physical Exam: General: Alert, mostly nonverbal, attempts to answer sometimes but hard to understand.Marland Kitchen HEENT: Dry mucous membranes Lungs: moderate air mov't, rhonchi present without wheezes  Heart regular rate rhythm without murmurs gallops rubs Abdomen soft nontender nondistended Extremities no clubbing cyanosis or edema  Lab Results: Basic Metabolic Panel:  Lab 03/24/11 9604 03/23/11 0459 03/21/11 0640  NA 145 147* --  K 3.1* 2.8* --  CL 110 116* --  CO2 20 22 --  GLUCOSE 93 196* --  BUN 16 16 --  CREATININE 0.52 0.58 --  CALCIUM 8.9 8.8 --  MG -- -- 2.0  PHOS -- -- --   Liver Function Tests:  Lab 03/21/11 0640  AST 15  ALT 16  ALKPHOS 69  BILITOT 0.6  PROT 6.5  ALBUMIN 2.1*    Lab 03/21/11 0640 03/18/11 0510  LIPASE 16 105*  AMYLASE -- --   No results found for this basename: AMMONIA:2 in the last 168 hours CBC:  Lab 03/23/11 0459 03/21/11 0640  WBC 12.9* 17.7*  NEUTROABS -- --  HGB 8.4* 8.6*  HCT 25.6* 25.3*  MCV 93.8 93.7  PLT 359 408*   Cardiac Enzymes: No results found for this basename: CKTOTAL:3,CKMB:3,CKMBINDEX:3,TROPONINI:3 in the last  168 hours BNP:  Lab 03/21/11 0640  PROBNP 2930.0*   D-Dimer: No results found for this basename: DDIMER:2 in the last 168 hours CBG:  Lab 03/24/11 1708 03/24/11 1201 03/24/11 0803 03/24/11 0051 03/23/11 1734 03/23/11 1115  GLUCAP 104* 103* 99 98 115* 137*   Hemoglobin A1C: No results found for this basename: HGBA1C in the last 168 hours Fasting Lipid Panel: No results found for this basename: CHOL,HDL,LDLCALC,TRIG,CHOLHDL,LDLDIRECT in the last 540 hours Thyroid Function Tests: No results found for this basename: TSH,T4TOTAL,FREET4,T3FREE,THYROIDAB in the last 168 hours Coagulation:  Lab 03/24/11 1124 03/23/11 0459  LABPROT 15.8* 19.5*  INR 1.23 1.62*   Anemia Panel: No results found for this basename: VITAMINB12,FOLATE,FERRITIN,TIBC,IRON,RETICCTPCT in the last 168 hours Urine Drug Screen: Drugs of Abuse  No results found for this basename: labopia,  cocainscrnur,  labbenz,  amphetmu,  thcu,  labbarb    Alcohol Level: No results found for this basename: ETH:2 in the last 168 hours Urinalysis: Results for TEIGAN, SAHLI (MRN [id number]) as of 03/21/2011 12:55  Ref. Range 03/21/2011 12:22  Color, Urine Latest Range: YELLOW  YELLOW  APPearance Latest Range: CLEAR  CLOUDY (A)  Specific Gravity, Urine Latest Range: 1.005-1.030  1.025  pH Latest Range: 5.0-8.0  6.0  Glucose,  UA Latest Range: NEGATIVE mg/dL NEGATIVE  Bilirubin Urine Latest Range: NEGATIVE  NEGATIVE  Ketones, ur Latest Range: NEGATIVE mg/dL NEGATIVE  Protein Latest Range: NEGATIVE mg/dL 045 (A)  Urobilinogen, UA Latest Range: 0.0-1.0 mg/dL 0.2  Nitrite Latest Range: NEGATIVE  NEGATIVE  Leukocytes, UA Latest Range: NEGATIVE  NEGATIVE  WBC, UA Latest Range: <3 WBC/hpf 11-20  RBC / HPF Latest Range: <3 RBC/hpf TOO NUMEROUS TO COUNT  Bacteria, UA Latest Range: RARE  MANY (A)    Micro Results: Recent Results (from the past 240 hour(s))  URINE CULTURE     Status: Normal   Collection Time   03/20/11  3:30 PM       Component Value Range Status Comment   Specimen Description URINE, CLEAN CATCH   Final    Special Requests NONE   Final    Setup Time [id number]   Final    Colony Count NO GROWTH   Final    Culture NO GROWTH   Final    Report Status 03/22/2011 FINAL   Final   CULTURE, BLOOD (ROUTINE X 2)     Status: Normal (Preliminary result)   Collection Time   03/21/11  7:52 AM      Component Value Range Status Comment   Specimen Description BLOOD LEFT HAND   Final    Special Requests     Final    Value: BOTTLES DRAWN AEROBIC AND ANAEROBIC 6CC EACH BOTTLE   Culture NO GROWTH 3 DAYS   Final    Report Status PENDING   Incomplete   CULTURE, BLOOD (ROUTINE X 2)     Status: Normal (Preliminary result)   Collection Time   03/21/11  8:50 AM      Component Value Range Status Comment   Specimen Description BLOOD LEFT FOREARM   Final    Special Requests     Final    Value: BOTTLES DRAWN AEROBIC AND ANAEROBIC 6CC EACH BOTTLE   Culture NO GROWTH 3 DAYS   Final    Report Status PENDING   Incomplete   SURGICAL PCR SCREEN     Status: Normal   Collection Time   03/22/11  6:45 AM      Component Value Range Status Comment   MRSA, PCR NEGATIVE  NEGATIVE  Final    Staphylococcus aureus NEGATIVE  NEGATIVE  Final    Studies/Results: No results found.  2D ECHO Study Conclusions  - Left ventricle: The cavity size was normal. There was mild concentric hypertrophy. Systolic function was normal. The estimated ejection fraction was in the range of 60% to 65%. Cannot exclude hypokinesis of a small segment at the base of the inferior wall. - Aortic valve: Mildly calcified annulus. - Mitral valve: Calcified annulus. - Left atrium: The atrium was moderately dilated. - Right ventricle: The cavity size was normal. Wall thickness was mildly increased. - Right atrium: The atrium was mildly dilated. - Atrial septum: No defect or patent foramen ovale was identified.     Scheduled Meds:    . albuterol  2.5 mg  Nebulization Q6H  . antiseptic oral rinse  1 application Mouth Rinse QID  . bisacodyl  10 mg Rectal QODAY  . ceFAZolin (ANCEF) IV  1 g Intravenous On Call  . chlorhexidine  15 mL Mouth/Throat BID  . cloNIDine  0.1 mg Transdermal Weekly  . fentaNYL  50 mcg Transdermal Q72H  . insulin aspart  0-9 Units Subcutaneous TID WC  . ipratropium  0.5 mg Nebulization Q6H  .  pantoprazole (PROTONIX) IV  40 mg Intravenous Q24H  . potassium chloride      . sodium chloride  10 mL Intracatheter Q12H  . sodium chloride       Continuous Infusions:    . dextrose 5 % with kcl 50 mL/hr at 03/24/11 0613    PRN Meds:.albuterol, metoprolol, ondansetron (ZOFRAN) IV, sodium chloride Assessment/Plan: Dysphagia, severe, with recurrent aspiration:  Discussed pt with IR today and they state his INR needs to be 1.5 or less, and that leukocytosis must be resolved prior to procedure - Pt's INR IS 1.23,  Will recheck cbc - radiology also requested re-entry of orders for NG under fluoro for barium and that once this is done- earliest tomorrow, then PEG placement will follow in 24hrs    Pt was admitted at [facility] on 12/25 and was there through 1/14 when she was tranferred to [facility] for NG tube placement under fluoro per radiology prior to PEG also to be placed per radiology. Pleaae see Dr Pincus Sanes transfer/progress note of 12/14 for details.   Ventilator dependent Respiratory failure secondary to pneumococcal pneumonia and sepsis,  resolved, status post extubation last week. Patient has been treated with a full course of broad-spectrum antibiotics. He has tachypnea which appears to be centrally mediated but has otherwise been stable from a pulmonary perspective.   CAP (community acquired pneumonia), probable pneumococcal - as above pt is s/p completion of abx   Toxic encephalopathy: Pt alert but remains poorly interactive. - felt to be related to the infection. 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        Benign hypertension: continue clonidine patch has been placed and  IV metoprolol as needed.   Hypokalemia: will replace k   Probable Non-ST elevation MI (NSTEMI) in the setting of sepsis, respiratory failure and acute illness: - Echocardiogram as above EF 60-65% cannot exclude hypokinesis of small segment of inferior.  - please see Dr Pincus Sanes note of 1/14 - follow as continues to improve consider inpatient vs outpt cards consult.   Pulmonary edema:resolved, monitor fluid status and further treat accordingly  -Patient had pulmonary edema while intubated and required several doses of Lasix. He has no evidence of fluid overload at the time of transfer.  GERD (gastroesophageal reflux disease)   Chronic pain: Patient  on a fentanyl patch    Anemia: stable   Hypernatremia: resolved with hypotonic IV fluids.   Elevated LFTs:  Resloved, Due to shock liver   Fecal impaction, resolved   Hyperglycemia: Related to steroids. Upon admission, patient was placed on high-dose Solu-Medrol by the [facility] physician which have since been tapered off.   Gross hematuria: Ultrasound of the kidneys showed cysts but no malignancy   I LOS: 21 days   Kambre Messner C 03/24/2011, 5:22 PM

## 2011-03-25 NOTE — Progress Notes (Signed)
Subjective: Patient awake, per nurse patient doesn't say a lot of words. Patient appears at baseline for mentation.   Objective: Filed Vitals:   03/25/11 0457 03/25/11 0839 03/25/11 1400 03/25/11 1448  BP: 127/67  127/74   Pulse: 62  67 64  Temp: 97.7 F (36.5 C)  97.5 F (36.4 C)   TempSrc: Oral  Oral   Resp: 20  18 18   Height:      Weight:      SpO2: 95% 95% 96% 94%   Weight change:   Intake/Output Summary (Last 24 hours) at 03/25/11 2051 Last data filed at 03/25/11 1900  Gross per 24 hour  Intake   1200 ml  Output   1100 ml  Net    100 ml    General: Alert, awake, , in no acute distress.  HEENT: No bruits, no goiter.  Heart: Regular rate and rhythm, without murmurs, rubs, gallops.  Lungs: Crackles left side, bilateral air movement.  Abdomen: Soft, nontender, nondistended, positive bowel sounds.  Neuro: passive movement of extremities.    Lab Results:  [facility] 03/25/11 0232 03/24/11 0530  NA 138 145  K 3.5 3.1*  CL 106 110  CO2 22 20  GLUCOSE 101* 93  BUN 15 16  CREATININE 0.56 0.52  CALCIUM 8.9 8.9  MG -- --  PHOS -- --    Basename 03/25/11 0232 03/23/11 0459  WBC 9.7 12.9*  NEUTROABS -- --  HGB 9.4* 8.4*  HCT 29.0* 25.6*  MCV 92.7 93.8  PLT 364 359    Micro Results: Recent Results (from the past 240 hour(s))  URINE CULTURE     Status: Normal   Collection Time   03/20/11  3:30 PM      Component Value Range Status Comment   Specimen Description URINE, CLEAN CATCH   Final    Special Requests NONE   Final    Setup Time [id number]   Final    Colony Count NO GROWTH   Final    Culture NO GROWTH   Final    Report Status 03/22/2011 FINAL   Final   CULTURE, BLOOD (ROUTINE X 2)     Status: Normal (Preliminary result)   Collection Time   03/21/11  7:52 AM      Component Value Range Status Comment   Specimen Description BLOOD LEFT HAND   Final    Special Requests     Final    Value: BOTTLES DRAWN AEROBIC AND ANAEROBIC 6CC EACH BOTTLE   Culture NO  GROWTH 4 DAYS   Final    Report Status PENDING   Incomplete   CULTURE, BLOOD (ROUTINE X 2)     Status: Normal (Preliminary result)   Collection Time   03/21/11  8:50 AM      Component Value Range Status Comment   Specimen Description BLOOD LEFT FOREARM   Final    Special Requests     Final    Value: BOTTLES DRAWN AEROBIC AND ANAEROBIC 6CC EACH BOTTLE   Culture NO GROWTH 4 DAYS   Final    Report Status PENDING   Incomplete   SURGICAL PCR SCREEN     Status: Normal   Collection Time   03/22/11  6:45 AM      Component Value Range Status Comment   MRSA, PCR NEGATIVE  NEGATIVE  Final    Staphylococcus aureus NEGATIVE  NEGATIVE  Final     Studies/Results: Dg Abd 1 View  03/25/2011  *RADIOLOGY REPORT*  Clinical  Data: 71 year old male.  Feeding tube placement.  Barium administration.  ABDOMEN - 1 VIEW  [facility]  Fluoroscopy time of 1.3 minutes was utilized.  Findings: Two fluoroscopic views of the upper abdomen.  Feeding tube tip at the level of the gastric body.  Contrast in the stomach and multiple small bowel loops.  IMPRESSION: Contrast in the stomach and multiple small bowel loops with feeding tube tip at the level of the gastric body.  Original Report Authenticated By: Harley Hallmark, M.D.   Dg Naso G Tube Plc W/fl-no Rad  03/25/2011  CLINICAL DATA: FOR BARIUM TO BE ADMINISTERED 24HRS BEFORE PEG PLACEMENT   NASO G TUBE PLACEMENT WITH FLUORO  Fluoroscopy was utilized by the requesting physician.  No radiographic  interpretation.      Medications: I have reviewed the patient's current medications.   Pt is a 71yo WM admitted to [facility] on 12/25 with AMS found to have Pneumonia/sepsis>VDRF, extubated last week, also with probable NSTEMI. He was transferred to [facility] 1/14 for NG placement under fluoro>barium>PEG per IR  Assessment/Plan:  Dysphagia, severe, with recurrent aspiration:  Admitting physician Discussed pt with IR  and they state his  INR needs to be 1.5 or less, and that leukocytosis must be resolved prior to procedure.  Ventilator dependent Respiratory failure secondary to pneumococcal pneumonia and sepsis,  resolved, status post extubation last week. Patient has been treated with a full course of broad-spectrum antibiotics. He has tachypnea which appears to be centrally mediated but has otherwise been stable from a pulmonary perspective.  CAP (community acquired pneumonia), probable pneumococcal  - as above pt is s/p completion of abx  Toxic encephalopathy:  Pt alert but remains poorly interactive.  - felt to be related to the infection. 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  Benign hypertension: continue clonidine patch has been placed and IV metoprolol as needed.  Hypokalemia: replaced. Repeat bmet am.  Probable Non-ST elevation MI (NSTEMI) in the setting of sepsis, respiratory failure and acute illness:  - Echocardiogram as above EF 60-65% cannot exclude hypokinesis of small segment of inferior.  - please see Dr Pincus Sanes note of 1/14 - follow as continues to improve consider inpatient vs outpt cards consult.  Pulmonary edema:resolved, monitor fluid status and further treat accordingly  -Patient had pulmonary edema while intubated and required several doses of Lasix. He has no evidence of fluid overload at the time of transfer.  GERD (gastroesophageal reflux disease)  Chronic pain: Patient on a fentanyl patch  Anemia: stable  Hypernatremia: resolved with hypotonic IV fluids.  Elevated LFTs: Resloved, Due to shock liver  Fecal impaction, resolved  Hyperglycemia: Related to steroids. Upon admission, patient was placed on high-dose Solu-Medrol by the [facility] physician which have since been tapered off.  Gross hematuria:  Ultrasound of the kidneys showed cysts but no malignancy     LOS: 22 days   REGALADO,BELKYS M.D.  [facility] 03/25/2011, 8:51 PM

## 2011-03-25 NOTE — Progress Notes (Signed)
Patient discussed at the Long Length of Stay Marcus [name] Weeks 03/25/2011  

## 2011-03-25 NOTE — Interval H&P Note (Cosign Needed)
History and Physical Interval Note:  03/25/2011 10:35 AM  Marcus [name]  Is tentatively scheduled for percutaneous gastrostomy tube placement on 1/17, with the diagnosis of Oropharyngeal dysphagia  .    The patients' history has been reviewed, patient examined, no change in status, stable for the above procedure.  I have reviewed the patients' chart and labs. White blood cell count is within normal limits, INR is normal and the patient is afebrile..  The patient's family is not available at this time to discuss the above procedure. Efforts to  reach the family by phone have been unsuccessful this morning. Once consent obtained from family we will proceed with procedure. Past Medical History  Diagnosis Date  . Hypertension   . GERD (gastroesophageal reflux disease)   . Hypercholesteremia   . Stroke   . Anemia 03/06/2011  . Smoker   . Non-ST elevation MI (NSTEMI) 03/07/2011    With PNA and resp failure  . Fecal impaction 03/17/2011  . 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

## 2011-03-26 ENCOUNTER — Encounter (HOSPITAL_COMMUNITY): Payer: Self-pay | Admitting: Internal Medicine

## 2011-03-26 ENCOUNTER — Inpatient Hospital Stay (HOSPITAL_COMMUNITY): Payer: Medicare Other

## 2011-03-26 LAB — BASIC METABOLIC PANEL
Calcium: 9 mg/dL (ref 8.4–10.5)
GFR calc non Af Amer: >90 mL/min (ref >90)
Glucose, Bld: 106 mg/dL — ABNORMAL HIGH (ref 70–99)
Sodium: 136 mEq/L (ref 135–145)

## 2011-03-26 LAB — CBC
MCH: 30.5 pg (ref 26.0–34.0)
MCHC: 33.3 g/dL (ref 30.0–36.0)
Platelets: 338 10*3/uL (ref 150–400)
RBC: 3.05 MIL/uL — ABNORMAL LOW (ref 4.22–5.81)

## 2011-03-26 LAB — GLUCOSE, CAPILLARY
Glucose-Capillary: 100 mg/dL — ABNORMAL HIGH (ref 70–99)
Glucose-Capillary: 101 mg/dL — ABNORMAL HIGH (ref 70–99)

## 2011-03-26 LAB — PROTIME-INR: Prothrombin Time: 14.7 seconds (ref 11.6–15.2)

## 2011-03-26 MED ORDER — CEFAZOLIN SODIUM 1-5 GM-% IV SOLN
INTRAVENOUS | Status: AC
  Administered 2011-03-26: 1 g via INTRAVASCULAR
  Filled 2011-03-26: qty 50

## 2011-03-26 MED ORDER — MORPHINE SULFATE 2 MG/ML IJ SOLN
0.5000 mg | INTRAMUSCULAR | Status: DC | PRN
  Filled 2011-03-26: qty 1

## 2011-03-26 MED ORDER — MIDAZOLAM HCL 5 MG/5ML IJ SOLN
INTRAMUSCULAR | Status: AC | PRN
  Administered 2011-03-26: 2 mg via INTRAVENOUS

## 2011-03-26 MED ORDER — IOHEXOL 300 MG/ML  SOLN: 50.0000 mL | Freq: Once | INTRAMUSCULAR | Status: AC | PRN

## 2011-03-26 MED ORDER — HYDROCODONE-ACETAMINOPHEN 5-325 MG PO TABS: 1.0000 | ORAL_TABLET | Freq: Four times a day (QID) | ORAL | Status: DC | PRN

## 2011-03-26 NOTE — Progress Notes (Signed)
Nutrition Follow-up  Diet Order:  NPO, patient is  still waiting for PEG placement. Per RN, unable to get contact with family to sign consent form. Patient had a NG tube for barium for procedure, but has since pulled it out him self.   Meds: Scheduled Meds:   . albuterol  2.5 mg Nebulization Q6H  . antiseptic oral rinse  1 application Mouth Rinse QID  . bisacodyl  10 mg Rectal QODAY  .  ceFAZolin (ANCEF) IV  1 g Intravenous On Call  . chlorhexidine  15 mL Mouth/Throat BID  . cloNIDine  0.1 mg Transdermal Weekly  . fentaNYL  50 mcg Transdermal Q72H  . insulin aspart  0-9 Units Subcutaneous TID WC  . ipratropium  0.5 mg Nebulization Q6H  . pantoprazole (PROTONIX) IV  40 mg Intravenous Q24H  . sodium chloride  10 mL Intracatheter Q12H   Continuous Infusions:   . dextrose 5 % with kcl 30 mL/hr at 03/25/11 2200   PRN Meds:.albuterol, metoprolol, ondansetron (ZOFRAN) IV, sodium chloride  Labs:  CMP     Component Value Date/Time   NA 136 03/26/2011 0535   K 3.7 03/26/2011 0535   CL 103 03/26/2011 0535   CO2 22 03/26/2011 0535   GLUCOSE 106* 03/26/2011 0535   BUN 12 03/26/2011 0535   CREATININE 0.52 03/26/2011 0535   CALCIUM 9.0 03/26/2011 0535   PROT 6.5 03/21/2011 0640   ALBUMIN 2.1* 03/21/2011 0640   AST 15 03/21/2011 0640   ALT 16 03/21/2011 0640   ALKPHOS 69 03/21/2011 0640   BILITOT 0.6 03/21/2011 0640   GFRNONAA >90 03/26/2011 0535   GFRAA >90 03/26/2011 0535     Intake/Output Summary (Last 24 hours) at 03/26/11 1100 Last data filed at 03/26/11 0600  Gross per 24 hour  Intake   1040 ml  Output   1800 ml  Net   -760 ml    Weight Status:  126 lbs, patient admit weight was 139 lbs, down 13 lbs since admission. Patients weight was trending up until 12/31, then has steadily trended down.   Re-estimated needs:  1500-1800 kcal, 70-80 gm protein  Nutrition Dx:  Inadequate oral intake now related to inability to eat as evidenced by NPO diet status  Goal:  Pt to meet >90% of needs,  unmet.  Intervention:   1. If it is anticipated that pt will remain NPO, may consider replacing NG tube and feeding via that rout until PEG can be placed. Patient has been NPO for ~5 days.  2. Once PEG has been placed, recommend restarting previous TF regimine of Jevity 1.2 with a goal rate of 60 ml/hr. This will provide 1728 kcal, 80 gm of protein, and 1162 ml water. Patient will need additional free water flushes of 200 ml every 6 hours if no other source of water is provided.  3. If bolus feeding is desired please consult for recommendations.   Monitor:  Peg placement, TF, weight, labs   Clarene Duke MARIE Pager #:  [phone]

## 2011-03-26 NOTE — Progress Notes (Signed)
Subjective: Patient awake, follow intermittent command. Patient had Peg tube placed today.  Objective: Filed Vitals:   03/26/11 0534 03/26/11 0753 03/26/11 1406 03/26/11 1439  BP: 124/75  110/61   Pulse: 70  77   Temp: 97.6 F (36.4 C)  98.2 F (36.8 C)   TempSrc: Axillary     Resp: 20  19   Height:      Weight:      SpO2: 97% 97% 96% 98%   Weight change:   Intake/Output Summary (Last 24 hours) at 03/26/11 1629 Last data filed at 03/26/11 0600  Gross per 24 hour  Intake   1040 ml  Output   1200 ml  Net   -160 ml    General: Alert, awake, oriented x3, in no acute distress.  HEENT: No bruits, no goiter.  Heart: Regular rate and rhythm, without murmurs, rubs, gallops.  Lungs: Crackles left side, bilateral air movement.  Abdomen: Soft, nontender, nondistended, positive bowel sounds.  Neuro: awake, moving extremities, follow intermittent command.    Lab Results:  [facility] 03/26/11 0535 03/25/11 0232  NA 136 138  K 3.7 3.5  CL 103 106  CO2 22 22  GLUCOSE 106* 101*  BUN 12 15  CREATININE 0.52 0.56  CALCIUM 9.0 8.9  MG -- --  PHOS -- --    Basename 03/26/11 0535 03/25/11 0232  WBC 9.4 9.7  NEUTROABS -- --  HGB 9.3* 9.4*  HCT 27.9* 29.0*  MCV 91.5 92.7  PLT 338 364    Micro Results: Recent Results (from the past 240 hour(s))  URINE CULTURE     Status: Normal   Collection Time   03/20/11  3:30 PM      Component Value Range Status Comment   Specimen Description URINE, CLEAN CATCH   Final    Special Requests NONE   Final    Setup Time [id number]   Final    Colony Count NO GROWTH   Final    Culture NO GROWTH   Final    Report Status 03/22/2011 FINAL   Final   CULTURE, BLOOD (ROUTINE X 2)     Status: Normal   Collection Time   03/21/11  7:52 AM      Component Value Range Status Comment   Specimen Description BLOOD LEFT HAND   Final    Special Requests     Final    Value: BOTTLES DRAWN AEROBIC AND ANAEROBIC 6CC EACH BOTTLE   Culture NO GROWTH 5 DAYS    Final    Report Status 03/26/2011 FINAL   Final   CULTURE, BLOOD (ROUTINE X 2)     Status: Normal   Collection Time   03/21/11  8:50 AM      Component Value Range Status Comment   Specimen Description BLOOD LEFT FOREARM   Final    Special Requests     Final    Value: BOTTLES DRAWN AEROBIC AND ANAEROBIC 6CC EACH BOTTLE   Culture NO GROWTH 5 DAYS   Final    Report Status 03/26/2011 FINAL   Final   SURGICAL PCR SCREEN     Status: Normal   Collection Time   03/22/11  6:45 AM      Component Value Range Status Comment   MRSA, PCR NEGATIVE  NEGATIVE  Final    Staphylococcus aureus NEGATIVE  NEGATIVE  Final     Studies/Results: Dg Abd 1 View  03/25/2011  *RADIOLOGY REPORT*  Clinical Data: 71 year old male.  Feeding tube placement.  Barium administration.  ABDOMEN - 1 VIEW  [facility].  Fluoroscopy time of 1.3 minutes was utilized.  Findings: Two fluoroscopic views of the upper abdomen.  Feeding tube tip at the level of the gastric body.  Contrast in the stomach and multiple small bowel loops.  IMPRESSION: Contrast in the stomach and multiple small bowel loops with feeding tube tip at the level of the gastric body.  Original Report Authenticated By: Harley Hallmark, M.D.   Dg Naso G Tube Plc W/fl-no Rad  03/25/2011  CLINICAL DATA: FOR BARIUM TO BE ADMINISTERED 24HRS BEFORE PEG PLACEMENT   NASO G TUBE PLACEMENT WITH FLUORO  Fluoroscopy was utilized by the requesting physician.  No radiographic  interpretation.      Medications: I have reviewed the patient's current medications.  Pt is a 71yo WM admitted to [facility] on 12/25 with AMS found to have Pneumonia/sepsis>VDRF, extubated last week, also with probable NSTEMI. He was transferred to [facility] 1/14 for NG placement under fluoro>barium>PEG per IR   Assessment/Plan:  Dysphagia, severe, with recurrent aspiration:  Sp post peg tube placement. Start nutrition.  Ventilator dependent Respiratory failure  secondary to pneumococcal pneumonia and sepsis,  resolved, status post extubation last week. Patient has been treated with a full course of broad-spectrum antibiotics. He has tachypnea which appears to be centrally mediated but has otherwise been stable from a pulmonary perspective.  CAP (community acquired pneumonia), probable pneumococcal  - as above pt is s/p completion of abx  Toxic encephalopathy:  Pt alert but remains poorly interactive.  - felt to be related to the infection. 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  Benign hypertension: continue clonidine patch has been placed and IV metoprolol as needed.  Hypokalemia: replaced.  Probable Non-ST elevation MI (NSTEMI) in the setting of sepsis, respiratory failure and acute illness:  - Echocardiogram as above EF 60-65% cannot exclude hypokinesis of small segment of inferior.  - please see Dr Pincus Sanes note of 1/14 - follow as continues to improve consider outpt cards consult.  Pulmonary edema:resolved, monitor fluid status and further treat accordingly  -Patient had pulmonary edema while intubated and required several doses of Lasix. He has no evidence of fluid overload at the time of transfer.  GERD (gastroesophageal reflux disease)  Chronic pain: Patient on a fentanyl patch  Anemia: stable  Hypernatremia: resolved with hypotonic IV fluids.  Elevated LFTs: Resloved, Due to shock liver  Fecal impaction, resolved  Hyperglycemia: Related to steroids. Upon admission, patient was placed on high-dose Solu-Medrol by the [facility] physician which have since been tapered off.  Gross hematuria: Ultrasound of the kidneys showed cysts but no malignancy Transfer to SNF 1[facility]18.     LOS: 23 days   Marleny Faller M.D.  [facility] 03/26/2011, 4:29  PM

## 2011-03-26 NOTE — Progress Notes (Signed)
Clinical Child psychotherapist received return call from Coralyn Helling at [facility] and Clinical Social Worker discussed with Coralyn Helling about weekend concern from [facility] and medical staff unable to contact pt daughter to obtain consent for medical procedures. [facility] aware of pt plan to discharge to [facility] [place]. Due to patient discharge plan, Adult Protective Services notified and will determine if follow up needed. Clinical Social Worker to continue to attempt to contact patient daughter and facilitate pt discharge needs when pt medically ready for discharge.  Jacklynn Lewis, MSW, LCSWA  Clinical Social Work [phone]

## 2011-03-26 NOTE — Progress Notes (Signed)
Pt pulled out NG tube. RN found intact tube on bed. Idalia Needle RN

## 2011-03-27 LAB — GLUCOSE, CAPILLARY
Glucose-Capillary: 100 mg/dL — ABNORMAL HIGH (ref 70–99)
Glucose-Capillary: 114 mg/dL — ABNORMAL HIGH (ref 70–99)
Glucose-Capillary: 114 mg/dL — ABNORMAL HIGH (ref 70–99)

## 2011-03-27 MED ORDER — JEVITY 1.2 CAL PO LIQD
1000.0000 mL | ORAL | Status: DC
Start: 2011-03-27 — End: 2011-03-27
  Administered 2011-03-27: 1000 mL
  Filled 2011-03-27 (×3): qty 1000

## 2011-03-27 MED ORDER — HYDROCODONE-ACETAMINOPHEN 10-325 MG PO TABS: 1.0000 | ORAL_TABLET | Freq: Four times a day (QID) | ORAL | Status: AC | PRN

## 2011-03-27 MED ORDER — ALBUTEROL SULFATE (5 MG/ML) 0.5% IN NEBU: 2.5000 mg | INHALATION_SOLUTION | Freq: Four times a day (QID) | RESPIRATORY_TRACT | Status: AC | PRN

## 2011-03-27 MED ORDER — FENTANYL 50 MCG/HR TD PT72: 1.0000 | MEDICATED_PATCH | TRANSDERMAL | Status: AC

## 2011-03-27 MED ORDER — ALBUTEROL SULFATE (5 MG/ML) 0.5% IN NEBU: 2.5000 mg | INHALATION_SOLUTION | Freq: Four times a day (QID) | RESPIRATORY_TRACT | Status: DC | PRN

## 2011-03-27 MED ORDER — IPRATROPIUM BROMIDE 0.02 % IN SOLN: 0.5000 mg | Freq: Four times a day (QID) | RESPIRATORY_TRACT | Status: DC | PRN

## 2011-03-27 MED ORDER — BISACODYL 10 MG RE SUPP: 10.0000 mg | RECTAL | Status: AC

## 2011-03-27 MED ORDER — JEVITY 1.2 CAL PO LIQD: 1000.0000 mL | ORAL | Status: AC

## 2011-03-27 MED ORDER — CHLORHEXIDINE GLUCONATE 0.12 % MT SOLN: 15.0000 mL | Freq: Two times a day (BID) | OROMUCOSAL | Status: AC

## 2011-03-27 MED ORDER — IPRATROPIUM BROMIDE 0.02 % IN SOLN: 0.5000 mg | Freq: Four times a day (QID) | RESPIRATORY_TRACT | Status: AC | PRN

## 2011-03-27 MED ORDER — CLONIDINE HCL 0.1 MG/24HR TD PTWK: 1.0000 | MEDICATED_PATCH | TRANSDERMAL | Status: AC

## 2011-03-27 NOTE — Discharge Summary (Signed)
Admit date: 03/03/2011 Discharge date: 03/27/2011  Primary Care Physician:  Briscoe Burns, [place], [place]   Discharge Diagnoses:         . Respiratory failure resolved, VDRF 03/04/2011   . Dysphagia 03/21/2011    PNA 03/21/2011   . Gross hematuria 03/18/2011   . Fecal impaction, resolved. 03/17/2011   . Thrombocytosis 03/16/2011   . Pulmonary edema resolved 03/09/2011   . Non-ST elevation MI (NSTEMI) 03/07/2011   . Anemia 03/06/2011   . Elevated troponin I level 03/06/2011   . Hypernatremia 03/06/2011   . Hyperglycemia 03/06/2011   . Elevated LFTs 03/06/2011   . Hypokalemia 03/04/2011   . CAP (community acquired pneumonia) 03/03/2011   . Toxic encephalopathy 03/03/2011   . Dehydration 03/03/2011   . Rhabdomyolysis 03/03/2011   . Benign hypertension 03/03/2011   . Hyperlipidemia 03/03/2011   . GERD (gastroesophageal reflux disease) 03/03/2011   . Chronic pain 03/03/2011           . 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 Hyponatremia 03/16/2011 03/23/2011     DISCHARGE MEDICATION: Current Discharge Medication List    START taking these medications   Details  albuterol (PROVENTIL) (5 MG/ML) 0.5% nebulizer solution Take 0.5 mLs (2.5 mg total) by nebulization every 6 (six) hours as needed for wheezing or shortness of breath. Qty: 20 mL, Refills: 0    bisacodyl (DULCOLAX) 10 MG suppository Place 1 suppository (10 mg total) rectally every other day. Qty: 12 suppository, Refills: 0    chlorhexidine (PERIDEX) 0.12 % solution Use as directed 15 mLs in the mouth or throat 2 (two) times daily. Qty: 120 mL, Refills: 0    cloNIDine (CATAPRES - DOSED IN MG/24 HR) 0.1 mg/24hr patch Place 1 patch (0.1 mg total) onto the skin once a week. Qty: 4 patch, Refills: 0    fentaNYL (DURAGESIC - DOSED MCG/HR) 50 MCG/HR Place 1 patch (50 mcg total) onto the skin every 3 (three) days. Qty: 5 patch, Refills: 0    ipratropium (ATROVENT) 0.02 % nebulizer solution Take 2.5 mLs (0.5 mg total) by  nebulization every 6 (six) hours as needed. Qty: 75 mL, Refills: 0    Nutritional Supplements (FEEDING SUPPLEMENT, JEVITY 1.2,) LIQD Place 1,000 mLs into feeding tube every 16 (sixteen) hours. Qty: 1000 mL, Refills: 10      CONTINUE these medications which have CHANGED   Details  HYDROcodone-acetaminophen (NORCO) 10-325 MG per tablet Take 1 tablet by mouth every 6 (six) hours as needed. For pain Qty: 30 tablet, Refills: 0      CONTINUE these medications which have NOT CHANGED   Details  Choline Fenofibrate (TRILIPIX) 45 MG capsule Take 45 mg by mouth daily.      fluticasone (FLONASE) 50 MCG/ACT nasal spray Place 2 sprays into the nose daily.      gabapentin (NEURONTIN) 400 MG capsule Take 400 mg by mouth 2 (two) times daily.      omeprazole (PRILOSEC) 40 MG capsule Take 40 mg by mouth daily.      sertraline (ZOLOFT) 100 MG tablet Take 100 mg by mouth daily.      simvastatin (ZOCOR) 40 MG tablet Take 40 mg by mouth daily.        STOP taking these medications     benazepril (LOTENSIN) 10 MG tablet      fentaNYL (DURAGESIC - DOSED MCG/HR) 100 MCG/HR      hydrochlorothiazide (HYDRODIURIL) 25 MG tablet            Consults:  SIGNIFICANT DIAGNOSTIC STUDIES:  Dg Abd 1 View  03/25/2011  *RADIOLOGY REPORT*  Clinical Data: 71 year old male.  Feeding tube placement.  Barium administration.  ABDOMEN - 1 VIEW  [facility]  Fluoroscopy time of 1.3 minutes was utilized.  Findings: Two fluoroscopic views of the upper abdomen.  Feeding tube tip at the level of the gastric body.  Contrast in the stomach and multiple small bowel loops.  IMPRESSION: Contrast in the stomach and multiple small bowel loops with feeding tube tip at the level of the gastric body.  Original Report Authenticated By: Harley Hallmark, M.D.   Ct Head Wo Contrast  03/10/2011  *RADIOLOGY REPORT*  Clinical Data: Neurologic changes.  CT HEAD WITHOUT CONTRAST   Technique:  Contiguous axial images were obtained from the base of the skull through the vertex without contrast.  Comparison: CT head without contrast 03/03/2011.  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 Remote lacunar infarcts in the left basal ganglia are stable. Moderate periventricular and subcortical white matter hypoattenuation is similar to the prior exam.  No acute cortical infarct, hemorrhage, or mass lesion is present.  Ventricular dilation is proportionate to the degree of atrophy and associated with the previous infarctions.  The paranasal sinuses and mastoid air cells are clear.  The osseous skull is intact.  IMPRESSION:  1.  No acute intracranial abnormality or significant interval change. 2.  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 3.  Stable atrophy and advanced white matter changes.  Original Report Authenticated By: Jamesetta Orleans. MATTERN, M.D.   Ct Head Wo Contrast  03/03/2011  *RADIOLOGY REPORT*  Clinical Data: Altered mental status.  Patient found unresponsive and unable to follow commands.  CT HEAD WITHOUT CONTRAST  Technique:  Contiguous axial images were obtained from the base of the skull through the vertex without contrast.  Comparison: Brain MRI, 12/11/2009  Findings: The ventricles are normal in overall configuration. There is ventricular and sulcal enlargement reflecting mild atrophy.  Encephalomalacia involves most of the occipital lobes bilaterally extending to the inferior parietal lobes.  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 Other old left basal ganglier lacunar infarcts are noted with additional lacunar infarcts present in the left centrum semiovale. There is also periventricular and subcortical white matter hypoattenuation most consistent with chronic microvascular ischemic change.  Other old deep white matter lacunar infarcts noted on the previous MRI are obscured in  the chronic microvascular ischemic change.  There are no parenchymal masses or mass effect.  There is no evidence of a recent transcortical infarct.  There are no extra- axial masses or abnormal fluid collections.  There is no intracranial hemorrhage.  The visualized sinuses and mastoid air cells are clear.  IMPRESSION: No acute intracranial abnormalities.  Atrophy, chronic microvascular ischemic change and multiple old infarcts are stable from the prior brain MRI.  Original Report Authenticated By:    Mr [name] Wo Contrast  03/17/2011  *RADIOLOGY REPORT*  Clinical Data: 71 year old male with prolonged encephalopathy. Decreased level of consciousness.  MRI HEAD WITHOUT AND WITH CONTRAST  Technique:  Multiplanar, multiecho pulse sequences of the brain and surrounding structures were obtained according to standard protocol without and with intravenous contrast  Contrast: 13mL MULTIHANCE GADOBENATE DIMEGLUMINE 529 MG/ML IV SOLN  Comparison: Head CT 03/10/2011.  [facility] brain MRI 12/11/2009.  Findings: Extensive bilateral occipital lobe encephalomalacia with gliosis has not significantly changed since 2011. Chronic  left basal ganglia lacunar infarct with associated chronic blood products. Stable small chronic lacunar infarct in the left thalamus.  Chronic blood products in the right paracentral pons noted without associated encephalomalacia.  This is new since 2011. Stable chronic lacunar infarct left lateral cerebellum.  Confluent periventricular white matter T2 and FLAIR hyperintensity with scattered additional small white matter lacunar infarcts also is not significantly changed since 2011.  No restricted diffusion to suggest acute infarction.   No new areas of signal abnormality in the brain. Major intracranial vascular flow voids are preserved.  Negative pituitary, cervicomedullary junction and visualized cervical spine.  Normal bone marrow signal. No abnormal enhancement identified.  Stable orbits  with postoperative changes to the right globe. Negative scalp soft tissues.  IMPRESSION: 1. No acute intracranial abnormality. 2.  Advanced chronic ischemic disease with mild progression since 2011.  Original Report Authenticated By: Harley Hallmark, M.D.   US Abdomen Complete  03/17/2011  *RADIOLOGY REPORT*  Clinical Data:  Nausea and vomiting.  Elevated lipase  COMPLETE ABDOMINAL ULTRASOUND  Comparison:  None.  Findings:  Gallbladder:  Small gallstones are present in the neck of the gallbladder measuring 4 mm.  Gallbladder sludge is present. Gallbladder wall measures 1.8 mm.  Negative sonographic Murphy's sign.  Common bile duct:  3.4 mm  Liver:  No focal lesion identified.  Within normal limits in parenchymal echogenicity.  IVC:  Appears normal.  Pancreas:  Limited visualization of the pancreas without edema or mass.  Spleen:  6.9 mm  Right Kidney:  11.0 cm.  Negative for obstruction.  Left Kidney:  11.3 cm.  18 x 19 mm left upper pole cyst.  Abdominal aorta:  No aneurysm identified.  IMPRESSION: Cholelithiasis with gallbladder sludge.  No gallbladder wall thickening is present.  Bile ducts are nondilated.  Original Report Authenticated By: Camelia Phenes, M.D.   Ir Gastrostomy Tube Mod Sed  03/26/2011  *RADIOLOGY REPORT*  Clinical Data/Indication: INABILITY TO TOLERATE NUTRITION. DYSPHAGIA.  PERC PLACEMENT GASTROSTOMY  Sedation: Versed 2 mg, Fentanyl zero mcg.  Total Moderate Sedation Time: Nine minutes.  Fluoroscopy Time: 2.8 minutes.  Procedure: The procedure, risks, benefits, and alternatives were explained to the patient. Questions regarding the procedure were encouraged and answered. The patient understands and consents to the procedure.  The epigastrium was prepped with Betadine in a sterile fashion, and a sterile drape was applied covering the operative field. A sterile gown and sterile gloves were used for the procedure.  A 5-French orogastric tube is placed under fluoroscopic guidance. Scout imaging  of the abdomen confirms barium within the transverse colon.  The stomach was distended with gas.  Under fluoroscopic guidance, an 18 gauge needle was utilized to puncture the anterior wall of the body of the stomach.   An Amplatz wire was advanced through the needle passing a T fastener into the lumen of the stomach.  The T fastener was secured for gastropexy.  A 9-French sheath was inserted.  A snare was advanced through the 9-French sheath.  A Teena Dunk was advanced through the orogastric tube.  It was snared then pulled out the oral cavity, pulling the snare, as well. The leading edge of the gastrostomy was attached to the snare.  It was then pulled down the esophagus and out the percutaneous site.  It was secured in place.  Contrast was injected.  No complication.  Findings: The image demonstrates placement of a 20-French pull- through type gastrostomy tube into the body of the stomach.  IMPRESSION: Successful  20 French pull-through gastrostomy.  Original Report Authenticated By: Donavan Burnet, M.D.   Dg Swallowing Function  03/19/2011  *RADIOLOGY REPORT*  Clinical Data: Dysphagia, improved alertness  SWALLOWING FUNCTION STUDY:  Technique:  Patient swallowed varying consistencies of barium with the assistance of a speech pathologist.  I performed real-time fluoroscopic assessment in the lateral projection.  Fluoroscopy time:  3.0 minutes  Comparison:  None available  Findings: Incidentally noted dense atherosclerotic calcification within the carotid systems bilaterally.  With applesauce consistency, impaired oral phase was identified. Spillover of contrast to the vallecula occurred.  No laryngeal penetration or aspiration.  Oral residuals were noted.  With thin barium by teaspoon and cup, spillover of contrast to the vallecula was identified with delayed initiation of swallow. Laryngeal penetration was seen with both presentations, though aspiration was only identified with the cup swallow.  With nectar  consistency by cup, laryngeal penetration was identified without aspiration.  With nectar consistency by straw, significant oral holding of contrast was identified.  This represented a large contrast volume and when swallowed, laryngeal penetration and aspiration were identified.  Additional presentation of nectar consistency by teaspoon demonstrated delayed initiation of swallow and laryngeal penetration without aspiration.  With honey consistency by teaspoon, spillover of contrast to the vallecula was identified without laryngeal penetration or aspiration.  Mild oral residual noted.  IMPRESSION: Swallowing dysfunction as above.  Dense atherosclerotic calcifications noted at the carotid systems bilaterally; recommend carotid duplex sonography for further assessment.  Original Report Authenticated By: Lollie Marrow, M.D.   US Renal  03/20/2011  *RADIOLOGY REPORT*  Clinical Data: History of hematuria.  RENAL/URINARY TRACT ULTRASOUND COMPLETE  Comparison:  Ultrasound 03/16/2012.  Findings:  Right Kidney:  Right renal length is 9.9 cm.  No right renal cyst is evident. There is some increased echogenicity of the parenchyma of the right kidney.  Left Kidney:  Left renal length is 10.9 cm.  A left renal cyst is seen.  The cyst is in the upper pole and measures 2.0 x 1.8 x 1.8 cm.  It is unchanged from the previous study. The echogenicity of the left renal parenchyma appears normal.  Examination of each kidney shows no evidence of hydronephrosis, solid  mass, calculus, or parenchymal loss.  Bladder:  Urinary bladder is decompressed by catheter.  IMPRESSION: Stable appearance of left renal cyst which appears simple. No evidence of hydronephrosis or solid renal mass.  No calculus is evident.  There is some increased echogenicity of the parenchyma of the right kidney.  This may be associated with medical renal disease.  Original Report Authenticated By: Crawford Givens, M.D.   Dg Chest Port 1 View  03/21/2011  *RADIOLOGY  REPORT*  Clinical Data: Fever and dysphagia.  PORTABLE CHEST - 1 VIEW  Comparison: 03/18/2011  Findings: Right-sided PICC line is unchanged with tip at low SVC. Mild cardiomegaly. No pleural effusion or pneumothorax.  Patchy left greater than right lower lobe predominant airspace and possibly reticular nodular opacities.  Not significantly changed.  IMPRESSION: No change in right greater than left lower lobe predominant airspace disease.  Given clinical history, suspicious for aspiration or less likely infection.  Original Report Authenticated By: Consuello Bossier, M.D.   Dg Chest Port 1 View  03/18/2011  *RADIOLOGY REPORT*  Clinical Data: Respiratory failure.  PORTABLE CHEST - 1 VIEW  Comparison: 03/15/2011.  Findings: Right upper extremity PICC appears unchanged.  There is no interval change in pulmonary aeration.  Interval removal of enteric tube.  Bilateral lower  lobe airspace opacities are present along with atelectasis.  Cardiomediastinal silhouette is unchanged.  IMPRESSION: No interval change in pulmonary aeration with basilar predominant airspace disease.  Unchanged right upper extremity PICC with removal of enteric tube.  Original Report Authenticated By: Andreas Newport, M.D.   Dg Chest Port 1 View  03/15/2011  *RADIOLOGY REPORT*  Clinical Data: Extubated, increasing white blood cell count  PORTABLE CHEST - 1 VIEW  Comparison: 03/13/2011  Findings: Interval extubation.  Bilateral lower lobe airspace opacities, mildly increased. No pleural effusion or pneumothorax.  Cardiomediastinal silhouette is within normal limits.  Enteric tube terminating at the antropyloric region.  Stable right PICC.  IMPRESSION: Interval extubation.  Bilateral lower lobe airspace opacities, mildly increased.  Original Report Authenticated By: Charline Bills, M.D.   Dg Chest Port 1 View  03/13/2011  *RADIOLOGY REPORT*  Clinical Data: Respiratory failure.  PORTABLE CHEST - 1 VIEW  Comparison: 03/10/2011  Findings: Endotracheal  tube is 3.4 cm above the carina.  Patchy interstitial densities in the lower lungs have minimally changed. Heart size is stable.  Nasogastric tube has been pulled back and the tip is in the distal esophagus.  Right arm PICC line tip is near the right atrium. Chronic elevation of the right humeral head.  IMPRESSION: Minimal change in the basilar interstitial lung densities.  Nasogastric tube has been pulled back.  The tip is in the distal esophagus.  This finding was discussed with the patient's nurse, Abby, on 03/13/2011 at 7:42 a.m.  Original Report Authenticated By: Richarda Overlie, M.D.   Dg Chest Port 1 View  03/10/2011  *RADIOLOGY REPORT*  Clinical Data: Respiratory failure  PORTABLE CHEST - 1 VIEW  Comparison: 03/08/2011, 03/07/2011, 03/03/2011.  Findings: The endotracheal tube, is approximately 1.5 cm above the carina.  Right upper extremity PICC terminates near the junction of the superior vena cava right atrium.  Cardiomediastinal silhouette is stable, with cardiomegaly suspected.  The lung volumes are low bilaterally.  There is diffuse interstitial prominence, and patchy airspace disease at both lung bases and in the perihilar regions.  Aeration of the lungs has improved compared to 03/08/2011. No visible pleural effusion.  IMPRESSION:  1. Improving aeration of the lungs in this patient with bibasilar airspace disease and interstitial prominence. 2.  Satisfactory position of support devices.  Original Report Authenticated By: Britta Mccreedy, M.D.   Dg Chest Port 1 View  03/08/2011  *RADIOLOGY REPORT*  Clinical Data: Respiratory failure.  PORTABLE CHEST - 1 VIEW  Comparison: 03/07/2011  Findings: Endotracheal tube tip is 3.4 cm above the carina.  The patient is rotated to the right on today's exam, resulting in reduced diagnostic sensitivity and specificity.   Nasogastric tube extends into the stomach.  Right-sided PICC line tip is indistinct but is believed to terminate in the lower SVC.  It is partially  obscured by a cardiac lead.  There is been interval partial clearing of the bilateral airspace opacities.  Diffuse interstitial opacity and patchy airspace opacities persist but are less dense than on yesterday's exam.  Thoracic spondylosis noted.  No cardiomegaly is observed.  IMPRESSION:  1.  Bilateral diffuse interstitial opacity with patchy airspace opacities which are less dense and less prominent than on yesterday's exam.  The appearance suggests mild improvement in atypical pneumonia or noncardiogenic edema.  Original Report Authenticated By: Dellia Cloud, M.D.   Dg Chest Port 1 View  03/07/2011  *RADIOLOGY REPORT*  Clinical Data: Rest toward failure  PORTABLE CHEST - 1 VIEW  Comparison:  03/06/2011, 03/05/2011, 03/03/2011.  Findings: Endotracheal tube is 2 cm above the carina.  Nasogastric tube terminates in the stomach. Right upper extremity PICC terminates at the cavoatrial junction.  Cardiac mediastinal silhouette is stable.  The lung volumes are very low.  There is bilateral patchy airspace disease throughout both lungs.  Aeration of the lungs is decreased on today's examination compared to yesterday.  IMPRESSION: Diffuse bilateral airspace disease and low lung volumes.  Worsening aeration of the lungs on today's examination may be due to increasing atelectasis or increasing airspace disease.  Original Report Authenticated By: Britta Mccreedy, M.D.   Dg Chest Port 1 View  03/06/2011  *RADIOLOGY REPORT*  Clinical Data: Respiratory failure.  Ventilator support.  PORTABLE CHEST - 1 VIEW  Comparison: 12/27 and multiple previous  Findings: Endotracheal tube has its tip 2.5 cm above the carina. Nasogastric tube enters the stomach.  Right arm tip of the PICC is difficult to see with certainty, but it is probably within the right atrium.  Bibasilar pneumonia persists, similar on the left and with slight further worsening on the right.  No sign of effusion.  No acute bony finding.  IMPRESSION: PICC  tip is probably in the right atrium.  Difficult to see precisely.  Persistent bibasilar pneumonia with some worsening on the right.  Original Report Authenticated By: Thomasenia Sales, M.D.   Chest Portable 1 View Post Insertion To Confirm Placement As Interpreted By Radiologist  03/05/2011  *RADIOLOGY REPORT*  Clinical Data: Line placement.  PORTABLE CHEST - 1 VIEW  Comparison: 03/05/2011.  Findings: Endotracheal tube is in satisfactory position. Nasogastric tube is followed into the stomach, with the side port in the region of the gastroesophageal junction.  Right PICC tip projects over the SVC.  Heart size stable.  There is diffuse bilateral air space disease, basilar predominant.  Consolidation is seen in the left lower lobe. No pleural fluid.  IMPRESSION: Basilar dependent bilateral air space disease with consolidation in the left lower lobe, stable.  Original Report Authenticated By: Reyes Ivan, M.D.   Dg Chest Port 1 View  03/05/2011  *RADIOLOGY REPORT*  Clinical Data: Respiratory failure.  PORTABLE CHEST - 1 VIEW  Comparison: Chest radiograph performed 03/04/2011  Findings: The patient's endotracheal tube is seen ending 4 cm above the carina.  An enteric tube is noted ending at the body of the stomach, with its sideport at the fundus of the stomach.  There has been interval worsening in right basilar airspace opacification, with persistent left-sided airspace disease, compatible with mildly worsening multifocal pneumonia.  No definite pleural effusion or pneumothorax is seen.  The cardiomediastinal silhouette is mildly enlarged.  No acute osseous abnormalities are identified.  IMPRESSION:  1.  Interval worsening in right basilar airspace disease, with persistent more diffuse left-sided airspace, compatible with mildly worsening multifocal pneumonia. 2.  Mild cardiomegaly.  Original Report Authenticated By: Tonia Ghent, M.D.   Dg Chest Port 1 View  03/04/2011  *RADIOLOGY REPORT*  Clinical  Data: Endotracheal tube placement.  PORTABLE CHEST - 1 VIEW  Comparison: Chest radiograph performed 03/03/2011  Findings: The patient's endotracheal tube is noted ending just above the carina, directed towards the right mainstem bronchus. This should be retracted 2-3 cm.  Lung expansion is mildly improved.  Persistent bilateral airspace opacification is seen, significantly more dense on the left.  This is essentially unchanged in appearance.  No pleural effusion or pneumothorax is seen.  The cardiomediastinal silhouette is borderline normal in size.  No acute  osseous abnormalities are seen.  IMPRESSION:  1.  Endotracheal tube noted ending just above the carina, directed towards the right mainstem bronchus.  This should be retracted 2-3 cm. 2.  Stable appearance to bilateral pneumonia, worse on the left.  Findings were discussed with Jasmine December RN in the [facility]U at 01:14 a.m. on 03/04/2011.  Original Report Authenticated By: Tonia Ghent, M.D.   Dg Chest Portable 1 View  03/03/2011  *RADIOLOGY REPORT*  Clinical Data: Altered mental status  PORTABLE CHEST - 1 VIEW  Comparison: None.  Findings: 1652 hours.  Interstitial markings are diffusely coarsened with chronic features.  There is patchy bibasilar airspace disease, left greater than right. The cardiopericardial silhouette is enlarged. Telemetry leads overlie the chest.  IMPRESSION: Left greater than right basilar airspace disease superimposed on chronic underlying interstitial coarsening.  Imaging features suggest bibasilar pneumonia with COPD.  Original Report Authenticated By: ERIC A. MANSELL, M.D.   Dg Chest Port 1v Same Day  03/03/2011  *RADIOLOGY REPORT*  Clinical Data: Respiratory distress.  PORTABLE CHEST - 1 VIEW SAME DAY  Comparison: Chest radiograph performed earlier today at 04:52 p.m.  Findings: The lungs are mildly hypoexpanded.  There is a relatively stable appearance to dense left-sided airspace opacification, compatible with pneumonia.   Mild right-sided airspace opacity is also seen.  There is no evidence of pleural effusion or pneumothorax.  The cardiomediastinal silhouette is within normal limits.  No acute osseous abnormalities are seen.  IMPRESSION: Lungs mildly hypoexpanded; relatively stable appearance to multifocal pneumonia, relatively dense at the left lower lung zone.  Original Report Authenticated By: Tonia Ghent, M.D.   Dg Abd Portable 1v  03/16/2011  *RADIOLOGY REPORT*  Clinical Data: Abdominal pain  PORTABLE ABDOMEN - 1 VIEW  Comparison: None.  Findings: An enteric tube terminates in the third portion of the duodenum.  There is a prominent amount of stool in the rectum. Rectal stool measures approximately 7.5 cm transverse diameter.  No significant stool burden is seen within the remainder of the colon. Gas is seen within several upper normal caliber central small bowel loops.  No definite evidence of small bowel obstruction on this single view. Gas is seen scattered within normal caliber colon. The stomach is decompressed.  There are marked degenerative changes with disc space narrowing, endplate sclerosis, and osteophyte formation throughout the lumbar spine.  Heavy atherosclerotic calcification of the aortoiliac vasculature.  IMPRESSION:  1.  Prominent amount of stool in the rectum.  Question fecal impaction. 2.  Small bowel loops are upper normal in caliber, without definite evidence of small bowel obstruction. 3.  Enteric tube (question if this is a nasogastric tube) terminates in the third portion of the duodenum.  Original Report Authenticated By: Britta Mccreedy, M.D.   Dg Naso G Tube Plc W/fl-no Rad  03/25/2011  CLINICAL DATA: FOR BARIUM TO BE ADMINISTERED 24HRS BEFORE PEG PLACEMENT   NASO G TUBE PLACEMENT WITH FLUORO  Fluoroscopy was utilized by the requesting physician.  No radiographic  interpretation.     Dg Swallowing Func-no Report  03/17/2011  CLINICAL DATA: dysphagia   FLUOROSCOPY FOR SWALLOWING FUNCTION STUDY:   Fluoroscopy was provided for swallowing function study, which was  administered by a speech pathologist.  Final results and recommendations  from this study are contained within the speech pathology report.        Recent Results (from the past 240 hour(s))  URINE CULTURE     Status: Normal   Collection Time   03/20/11  3:30 PM      Component Value Range Status Comment   Specimen Description URINE, CLEAN CATCH   Final    Special Requests NONE   Final    Setup Time [id number]   Final    Colony Count NO GROWTH   Final    Culture NO GROWTH   Final    Report Status 03/22/2011 FINAL   Final   CULTURE, BLOOD (ROUTINE X 2)     Status: Normal   Collection Time   03/21/11  7:52 AM      Component Value Range Status Comment   Specimen Description BLOOD LEFT HAND   Final    Special Requests     Final    Value: BOTTLES DRAWN AEROBIC AND ANAEROBIC 6CC EACH BOTTLE   Culture NO GROWTH 5 DAYS   Final    Report Status 03/26/2011 FINAL   Final   CULTURE, BLOOD (ROUTINE X 2)     Status: Normal   Collection Time   03/21/11  8:50 AM      Component Value Range Status Comment   Specimen Description BLOOD LEFT FOREARM   Final    Special Requests     Final    Value: BOTTLES DRAWN AEROBIC AND ANAEROBIC 6CC EACH BOTTLE   Culture NO GROWTH 5 DAYS   Final    Report Status 03/26/2011 FINAL   Final   SURGICAL PCR SCREEN     Status: Normal   Collection Time   03/22/11  6:45 AM      Component Value Range Status Comment   MRSA, PCR NEGATIVE  NEGATIVE  Final    Staphylococcus aureus NEGATIVE  NEGATIVE  Final     BRIEF ADMITTING H & P: Marcus [name] is an 71 y.o. male who can provide no history. His daughter provides some history but sketchy. She called 911 when he appeared very weak and confused today. She reports that he saw his primary care physician on December 21. She reports that he seemed "in and out", confused yesterday, but it was much worse today. She tried to get him up out of bed today and he was  unable so she called EMS. EMS reportedly found him in stool and urine. The daughter reports that she is his primary caretaker but that she starts a new job Advertising account executive. In the emergency room, he was found to be tachycardic, hypoxic, febrile, and tachypneic. He had a chest x-ray showing pneumonia, and he was nonverbal and lethargic.  Patient is a 70yo WM admitted to [facility] on 12/25 with AMS found to have Pneumonia/sepsis>VDRF, extubated last week, also with probable NSTEMI. He was transferred to [facility] 1/14 for NG placement under fluoro>barium>PEG per IR  Hospital Course: Assessment/Plan:  Dysphagia, severe, with recurrent aspiration:  Sp post peg tube placement. Continue with nutrition. Patient will need repeated swallow evaluation at some point.  Ventilator dependent Respiratory failure secondary to pneumococcal pneumonia and sepsis,  resolved, status post extubation last week. Patient has been treated with a full course of broad-spectrum antibiotics.  CAP (community acquired pneumonia), probable pneumococcal  - as above pt is s/p completion of abx  Toxic encephalopathy:  Pt alert but remains poorly interactive.  - felt to be related to the infection. 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  Benign hypertension: continue clonidine patch. Hypokalemia:  replaced.  Probable Non-ST elevation MI (NSTEMI) in the setting of sepsis, respiratory failure and acute illness:  - Echocardiogram as above EF 60-65% cannot exclude hypokinesis of small segment of inferior.  - please see Dr Pincus Sanes note of 1/14 - follow as continues to improve consider outpt cards consult.  Pulmonary edema:resolved, monitor fluid status and further treat accordingly  -Patient had pulmonary edema while intubated and required several doses of  Lasix. He has no evidence of fluid overload at the time of transfer.  GERD (gastroesophageal reflux disease)  Chronic pain: Patient on a fentanyl patch  Anemia: stable  Hypernatremia: resolved with hypotonic IV fluids.  Elevated LFTs: Resloved, Due to shock liver. Fecal impaction, resolved  Hyperglycemia: Related to steroids. Upon admission, patient was placed on high-dose Solu-Medrol by the [facility] physician which have since been tapered off.  Gross hematuria: Ultrasound of the kidneys showed cysts but no malignancy   Patient stable for discharge.        Disposition and Follow-up:  Discharge Orders    Future Orders Please Complete By Expires   Increase activity slowly        Follow-up Information    Follow up with SLATE,SCOTT, PA .          DISCHARGE EXAM:  General: Alert, awake,, in no acute distress.  HEENT: No bruits, no goiter.  Heart: Regular rate and rhythm, without murmurs, rubs, gallops.  Lungs: Crackles left side, bilateral air movement.  Abdomen: Soft, nontender, nondistended, positive bowel sounds.  Neuro: awake, moving extremities, follow intermittent command. Say Ok.    Blood pressure 107/66, pulse 84, temperature 98.4 F (36.9 C), temperature source Oral, resp. rate 20, height 5\' 6"  (1.676 m), weight 57.3 kg (126 lb 5.2 oz), SpO2 91.00%.   Basename 03/26/11 0535 03/25/11 0232  NA 136 138  K 3.7 3.5  CL 103 106  CO2 22 22  GLUCOSE 106* 101*  BUN 12 15  CREATININE 0.52 0.56  CALCIUM 9.0 8.9  MG -- --  PHOS -- --    Basename 03/26/11 0535 03/25/11 0232  WBC 9.4 9.7  NEUTROABS -- --  HGB 9.3* 9.4*  HCT 27.9* 29.0*  MCV 91.5 92.7  PLT 338 364    Signed: Zaheer Wageman M.D. 03/27/2011, 10:07 AM

## 2011-03-27 NOTE — Plan of Care (Signed)
Problem: Phase I Progression Outcomes Goal: OOB as tolerated unless otherwise ordered Outcome: Not Applicable Date Met:  03/27/11 Going to SNF  Problem: Phase III Progression Outcomes Goal: O2 sats > or equal to 93% on room air Outcome: Not Applicable Date Met:  03/27/11 Going to SNF

## 2011-03-27 NOTE — Progress Notes (Signed)
Clinical Social Worker had been unable to reach pt daughter with multiple attempts and other medical staff has been unable to contact pt daughter. Clinical Social Worker contacted [employer] who assisted in arranging [employer] to do well check at pt daughter residence. Pt daughter contacted Clinical Social Worker immediately following Sheriff visit and is aware and agreeable to pt discharge to [facility] [place] today and pt daughter reports that she would be going to [facility] [place] to complete paperwork. Clinical Social Worker notified facility and facility agreeable to this Clinical Social Worker arranging ambulance transportation for patient. Clinical Social Worker facilitated pt discharge needs including contacting facility, family, and arranging ambulance transportation to [facility] [place].  Jacklynn Lewis, MSW, LCSWA  Clinical Social Work [phone]

## 2011-03-27 NOTE — Progress Notes (Signed)
Brief Nutrition Note:  Patient had PEG placed 1/17. RD consulted to begin tube feeding. RD following, for additional information please see last follow up note 1/17.   Patient estimated nutrition needs: 1500-1800 kcal, 70-80 gm protein, and >1.5 L of water.    Will start Jevity 1.2 at 20 ml/hr and advance by 10 ml every 4 hours to a goal rate of 60 ml/hr. This will provide 1728 kcal, 78 gm of protein, and 1162 ml free water. Additional free water flushes will be required to meet hydration needs. Add additional 150 ml water flush every 6 hours. This will provide of total of 1762 ml water.   Clarene Duke MARIE [phone]

## 2011-03-27 NOTE — Progress Notes (Signed)
Pt prepared for transfer to SNF. New foley placed per order. Skin intact except as already charted. PICC line was d/c'd by IV team per order. Pt placed in disposable gown, to be transported by [facility].

## 2011-03-27 NOTE — Progress Notes (Signed)
Patient ID: Marcus [name], male   DOB: 1940/10/27, 71 y.o.   MRN: [id number]   G tube placed 1/17 Already in use today.  Afeb; site clean and dry; NT +BS; no sign of inf  Call us need [place].

## 2011-05-13 ENCOUNTER — Telehealth: Payer: Self-pay

## 2011-05-13 DIAGNOSIS — Z431 Encounter for attention to gastrostomy: Secondary | ICD-10-CM

## 2011-05-13 NOTE — Telephone Encounter (Signed)
LATE ENTRY: Received a phone call from Patsy at [facility] this AM. She wanted to schedule a peg removal. She didn't know anything about the pt. She got April Tinsley , nurse, on the phone. She said that the pt was eating really well and needed to get it out. When I pulled his chart up on epic, he had been scheduled with Dr. Karilyn Cota previously for a procedure. I gave them Dr. Patty Sermons phone number and April said they would contact him.

## 2011-05-13 NOTE — Telephone Encounter (Signed)
REVIEWED. AGREE. 

## 2012-08-18 IMAGING — CR DG ABD PORTABLE 1V
1 series · 1 of 1 positions shown · non-contrast
Comparison: None.

CLINICAL DATA: Abdominal pain

PORTABLE ABDOMEN - 1 VIEW

[view not recorded]
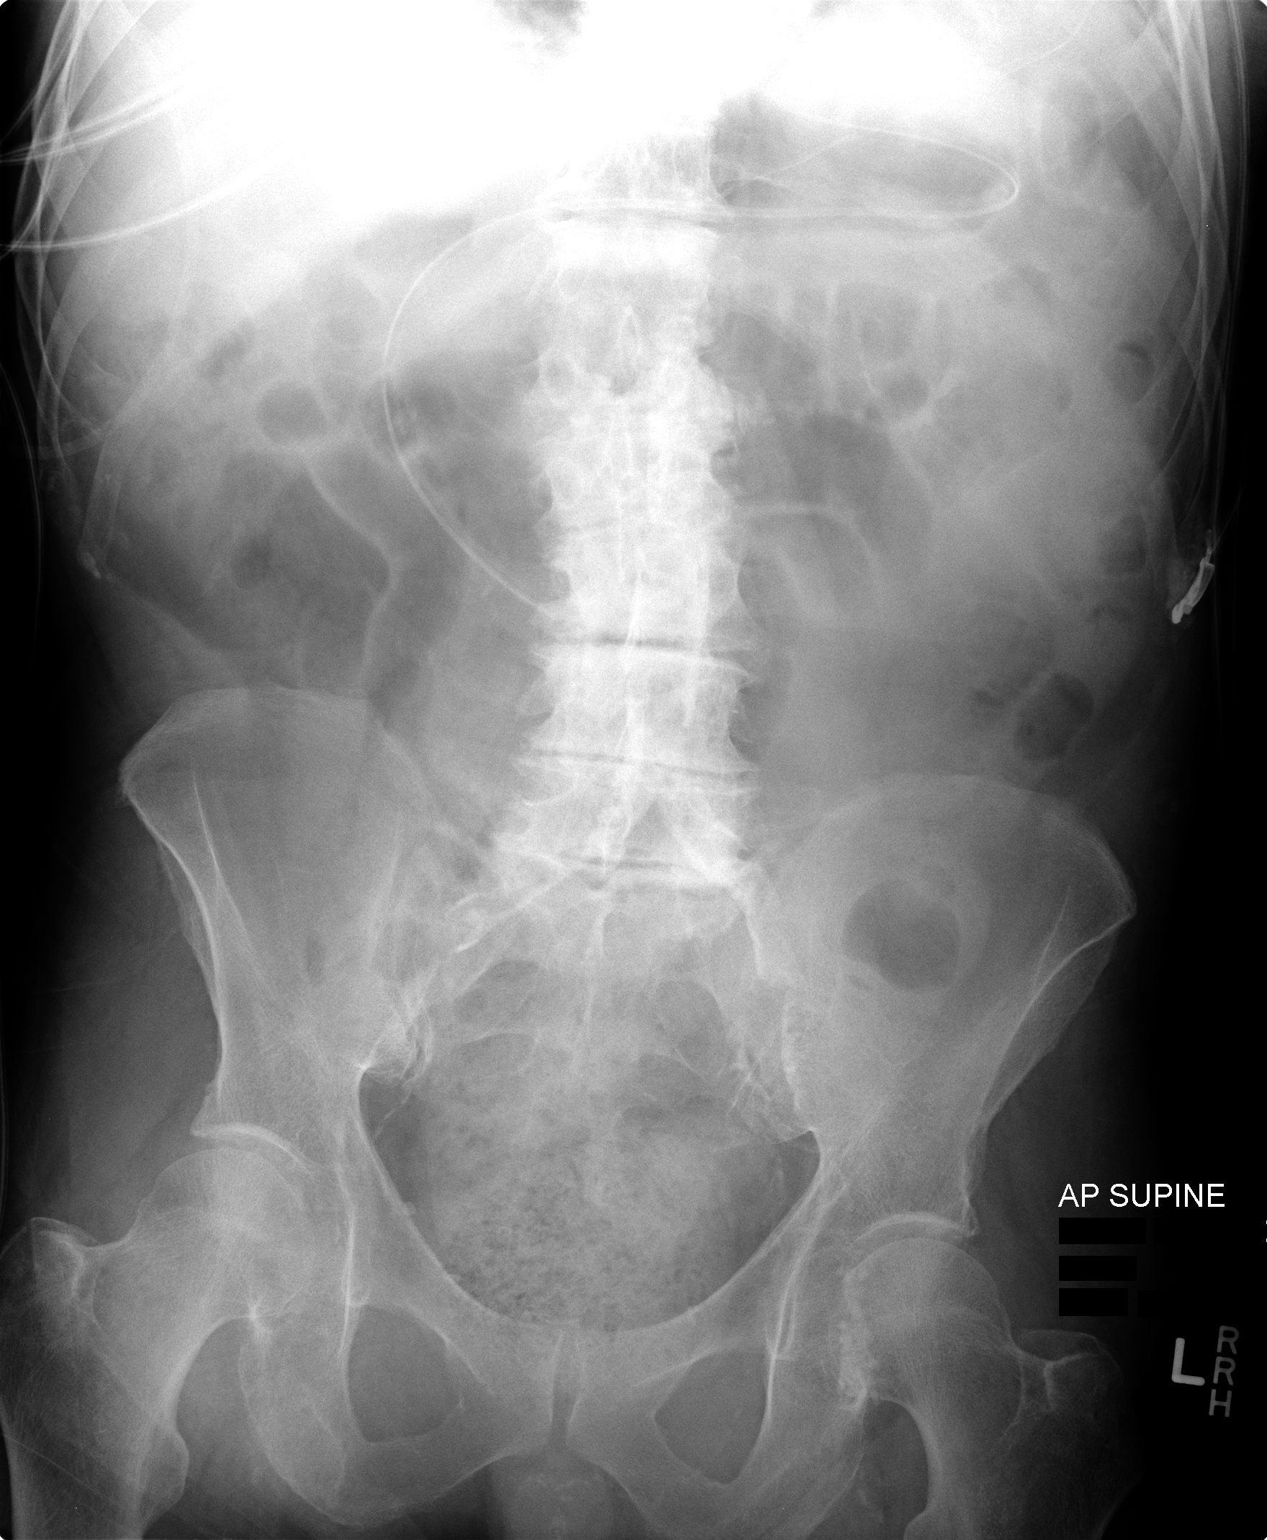

[1 of 1 positions shown; findings below may reference images not displayed]

FINDINGS: An enteric tube terminates in the third portion of the
duodenum.  There is a prominent amount of stool in the rectum.
Rectal stool measures approximately 7.5 cm transverse diameter.  No
significant stool burden is seen within the remainder of the colon.
Gas is seen within several upper normal caliber central small bowel
loops.  No definite evidence of small bowel obstruction on this
single view. Gas is seen scattered within normal caliber colon.
The stomach is decompressed.

There are marked degenerative changes with disc space narrowing,
endplate sclerosis, and osteophyte formation throughout the lumbar
spine.

Heavy atherosclerotic calcification of the aortoiliac vasculature.
IMPRESSION: 1.  Prominent amount of stool in the rectum.  Question fecal
impaction.
2.  Small bowel loops are upper normal in caliber, without definite
evidence of small bowel obstruction.
3.  Enteric tube (question if this is a nasogastric tube)
terminates in the third portion of the duodenum.

## 2012-08-22 IMAGING — US US RENAL
1 series · 14 of 22 positions shown · non-contrast
Comparison: Ultrasound 03/16/2012.

CLINICAL DATA: History of hematuria.

RENAL/URINARY TRACT ULTRASOUND COMPLETE

[Series 1: us renal · 0.35mm/px · 14 of 22 slices shown]
[im 1/22]
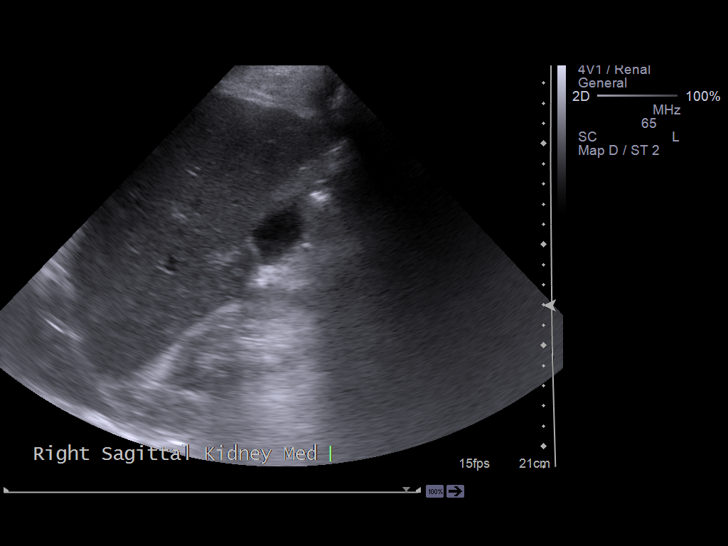
[im 3/22]
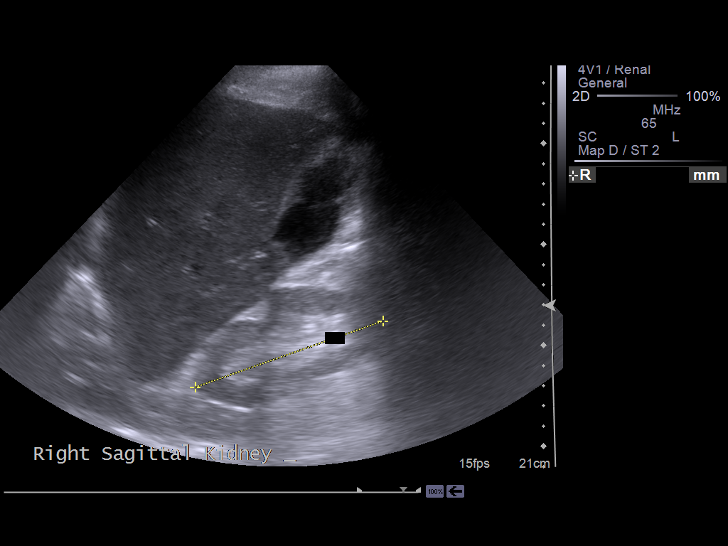
[im 4/22]
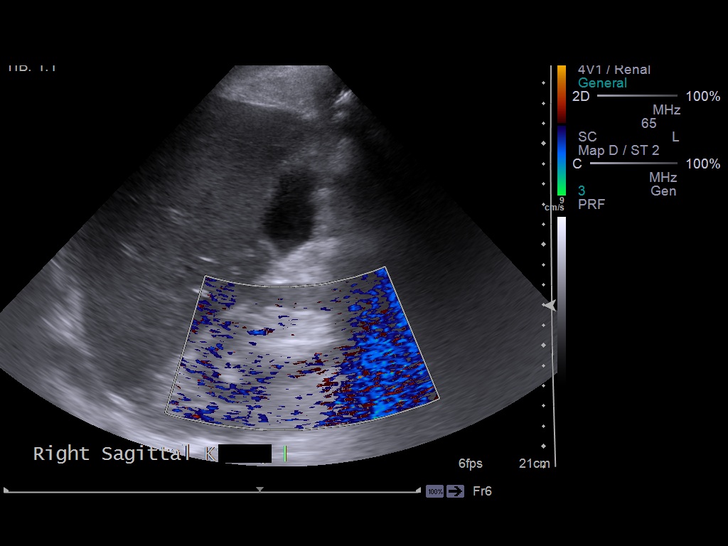
[im 6/22]
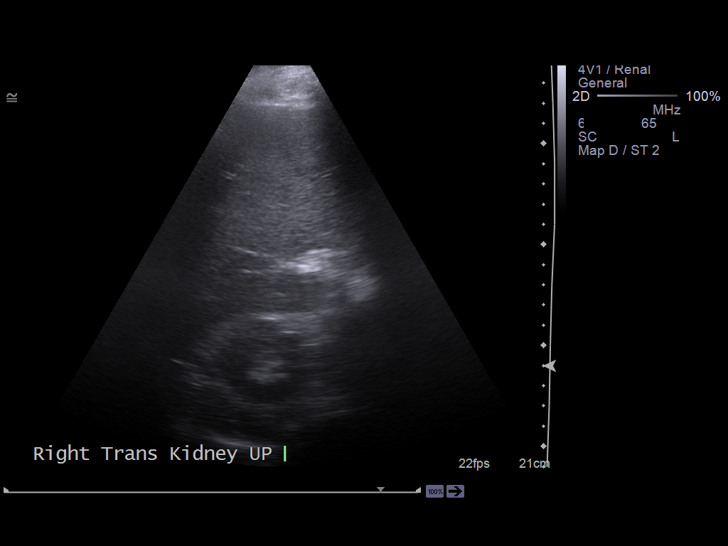
[im 8/22]
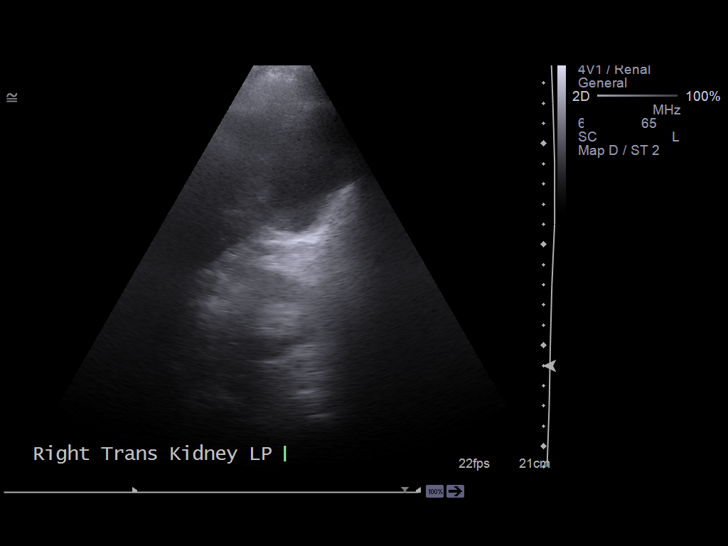
[im 9/22]
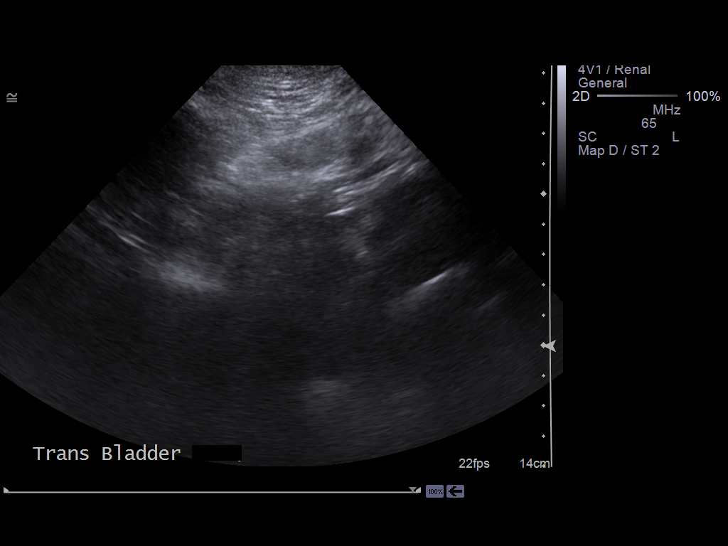
[im 11/22]
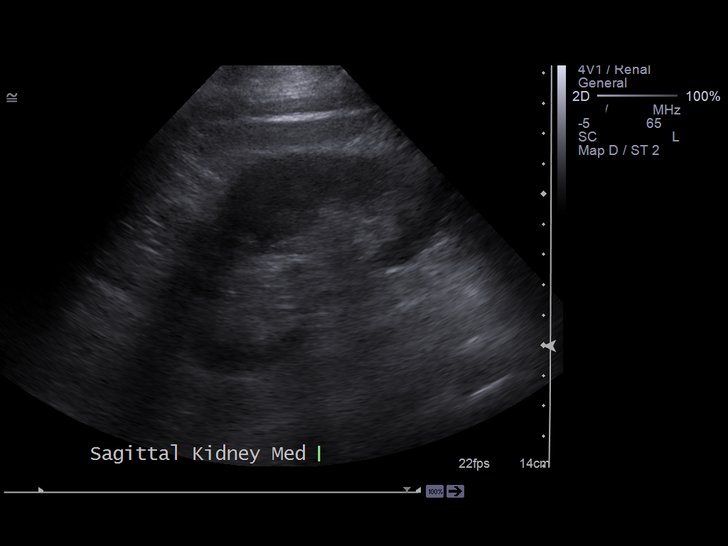
[im 12/22]
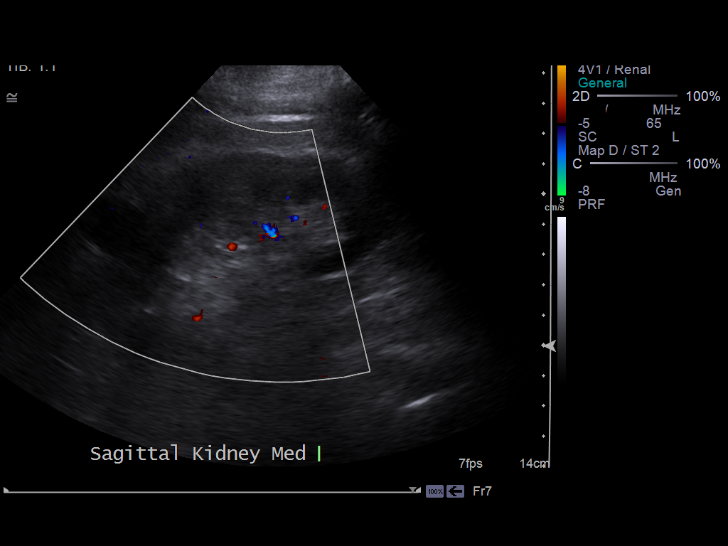
[im 14/22]
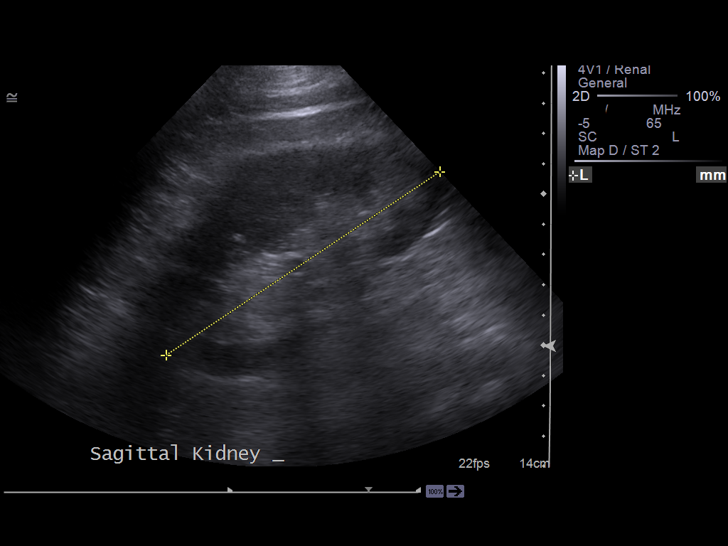
[im 15/22]
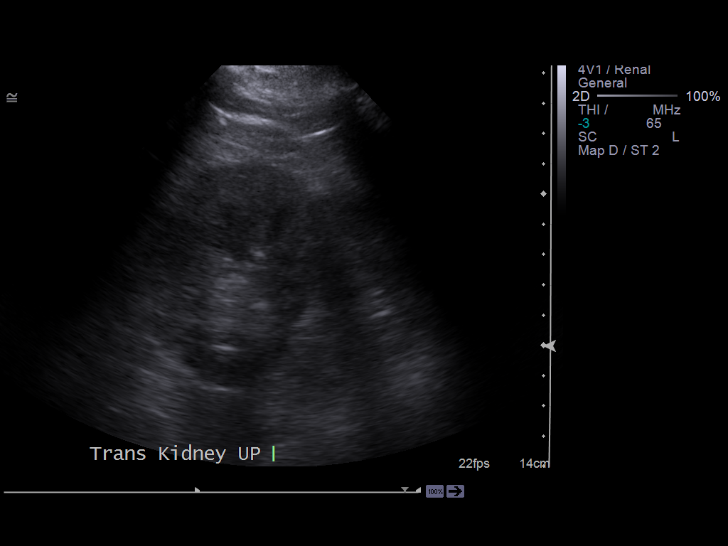
[im 17/22]
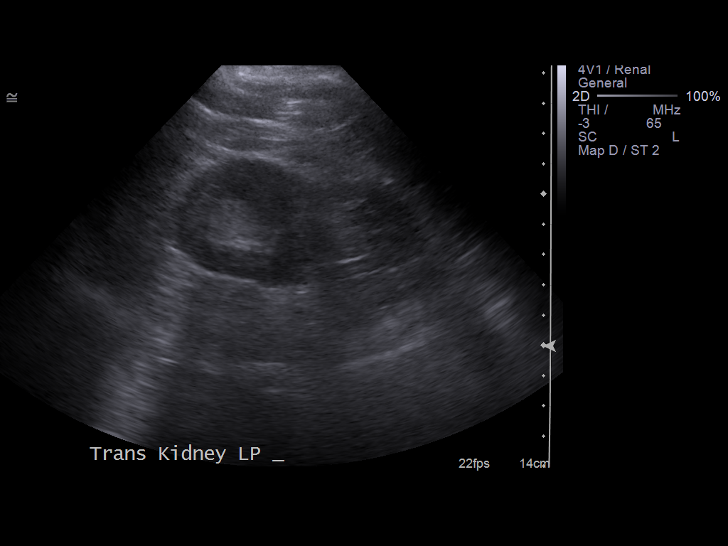
[im 19/22]
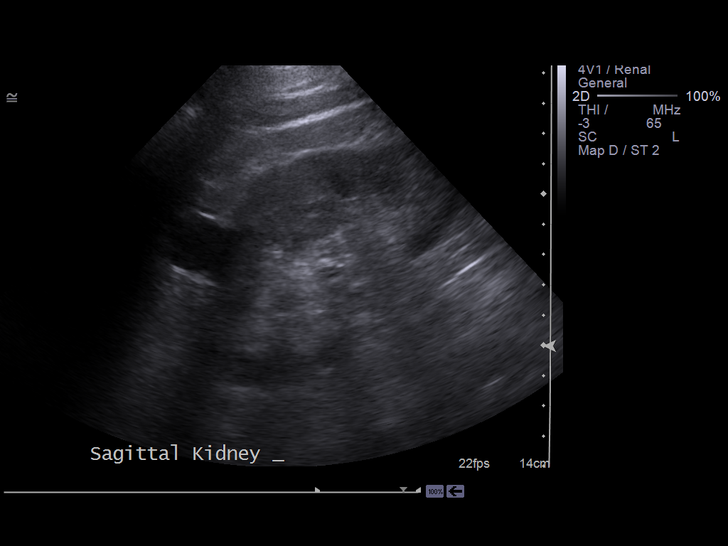
[im 20/22]
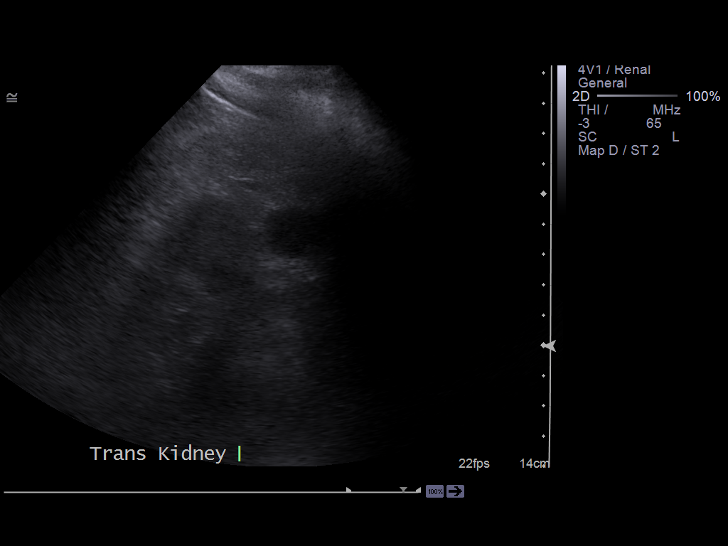
[im 22/22]
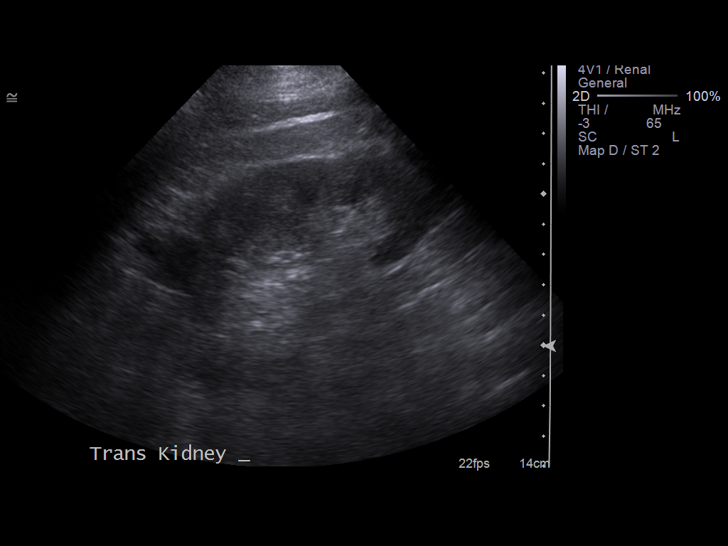

[14 of 22 positions shown; findings below may reference images not displayed]

FINDINGS: Right Kidney:  Right renal length is 9.9 cm.  No right renal cyst
is evident. There is some increased echogenicity of the parenchyma
of the right kidney.

Left Kidney:  Left renal length is 10.9 cm.  A left renal cyst is
seen.  The cyst is in the upper pole and measures 2.0 x 1.8 x
cm.  It is unchanged from the previous study. The echogenicity of
the left renal parenchyma appears normal.

Examination of each kidney shows no evidence of hydronephrosis,
solid  mass, calculus, or parenchymal loss.

Bladder:  Urinary bladder is decompressed by catheter.
IMPRESSION: Stable appearance of left renal cyst which appears simple. No
evidence of hydronephrosis or solid renal mass.  No calculus is
evident.  There is some increased echogenicity of the parenchyma of
the right kidney.  This may be associated with medical renal
disease.

## 2012-08-23 IMAGING — CR DG CHEST 1V PORT
1 series · 1 of 1 positions shown · non-contrast
Comparison: 03/18/2011

CLINICAL DATA: Fever and dysphagia.

PORTABLE CHEST - 1 VIEW

[view not recorded]
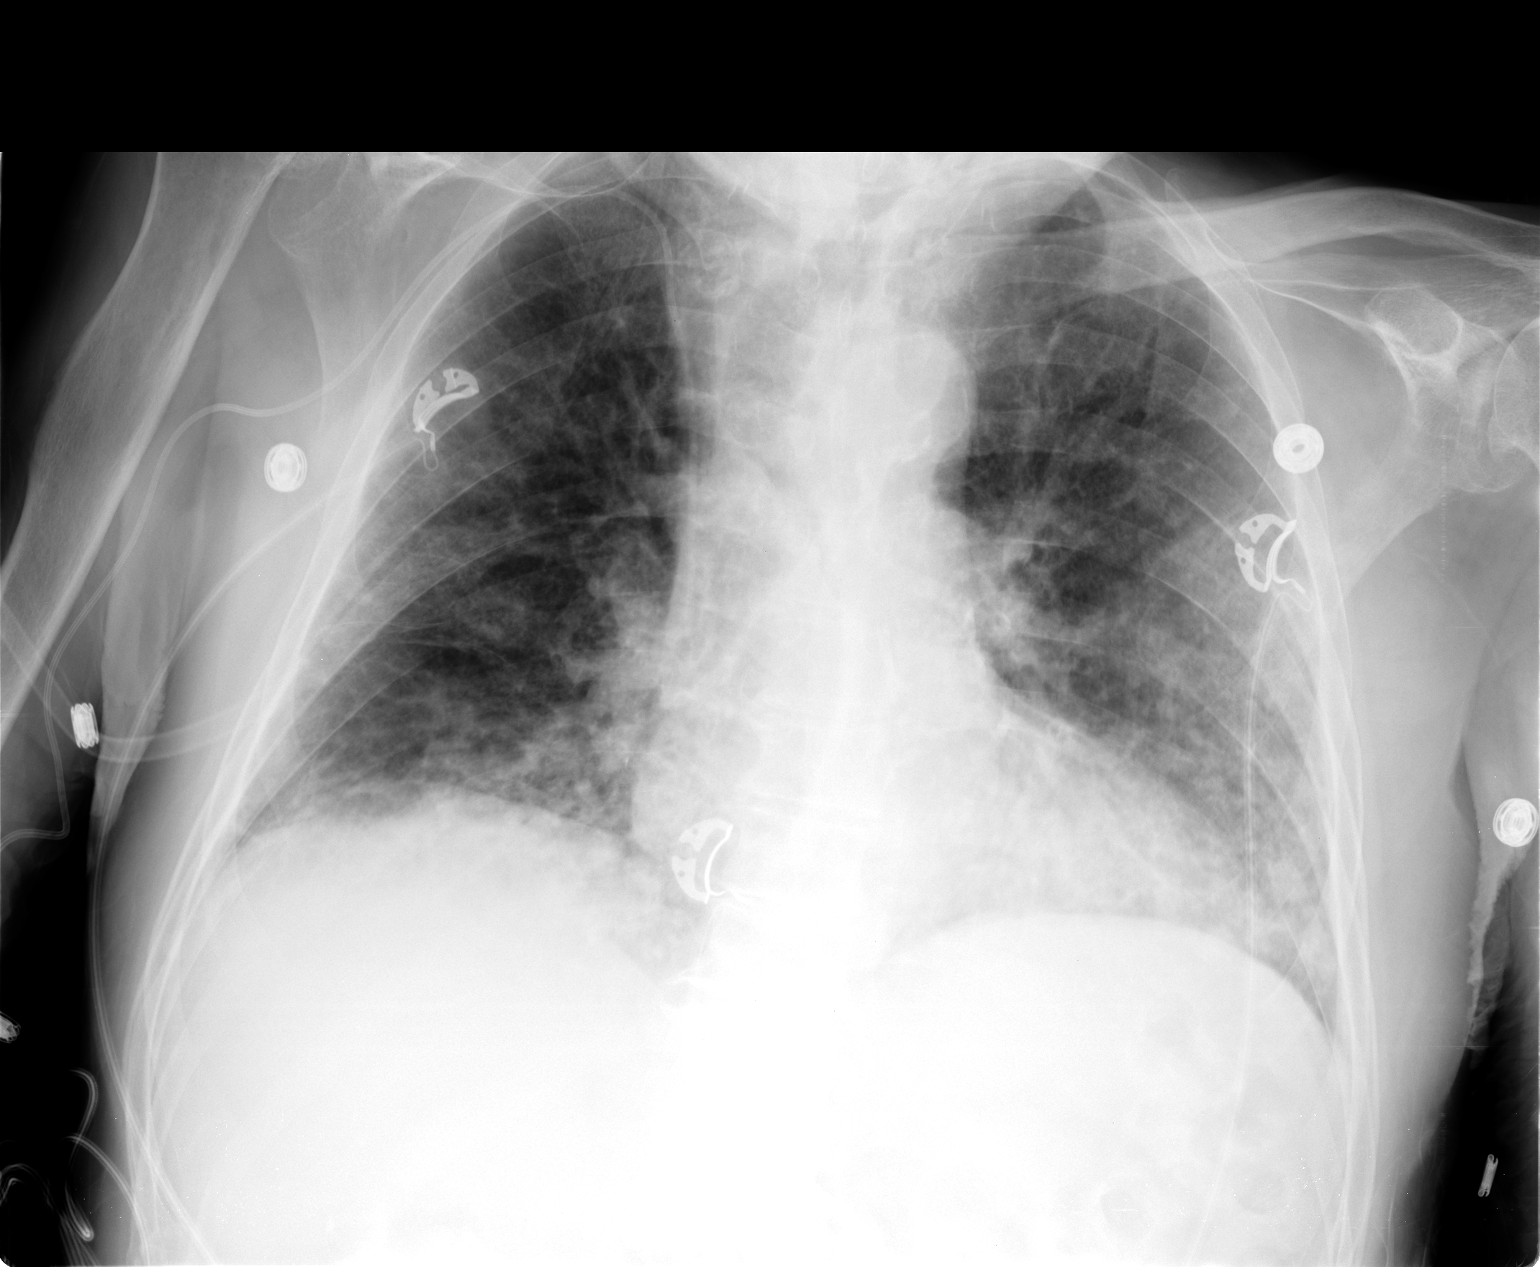

[1 of 1 positions shown; findings below may reference images not displayed]

FINDINGS: Right-sided PICC line is unchanged with tip at low SVC.
Mild cardiomegaly. No pleural effusion or pneumothorax.  Patchy
left greater than right lower lobe predominant airspace and
possibly reticular nodular opacities.  Not significantly changed.
IMPRESSION: No change in right greater than left lower lobe predominant
airspace disease.  Given clinical history, suspicious for
aspiration or less likely infection.

## 2012-08-28 IMAGING — XA IR PERC PLACEMENT GASTROSTOMY
1 series · 8 of 8 positions shown · non-contrast
Comparison: none

Clinical Data/Indication: INABILITY TO TOLERATE NUTRITION.
DYSPHAGIA.

PERC PLACEMENT GASTROSTOMY
Sedation: Versed 2 mg, Fentanyl zero mcg.
Total Moderate Sedation Time: Nine minutes.
Fluoroscopy Time: 2.8 minutes.
Procedure: The procedure, risks, benefits, and alternatives were
explained to the patient. Questions regarding the procedure were
encouraged and answered. The patient understands and consents to
the procedure.
The epigastrium was prepped with Betadine in a sterile fashion, and
a sterile drape was applied covering the operative field. A sterile
gown and sterile gloves were used for the procedure.
A 5-French orogastric tube is placed under fluoroscopic guidance.
Scout imaging of the abdomen confirms barium within the transverse
colon.
The stomach was distended with gas.  Under fluoroscopic guidance,
an 18 gauge needle was utilized to puncture the anterior wall of
the body of the stomach.   An Amplatz wire was advanced through the
needle passing a T fastener into the lumen of the stomach.  The T
fastener was secured for gastropexy.  A 9-French sheath was
inserted.
A snare was advanced through the 9-French sheath.  Dleke Tiger was
advanced through the orogastric tube.  It was snared then pulled
out the oral cavity, pulling the snare, as well. The leading edge
of the gastrostomy was attached to the snare.  It was then pulled
down the esophagus and out the percutaneous site.  It was secured
in place.  Contrast was injected.  No complication.

[Series 1: run · 8 of 8 slices shown]
[im 1/8]
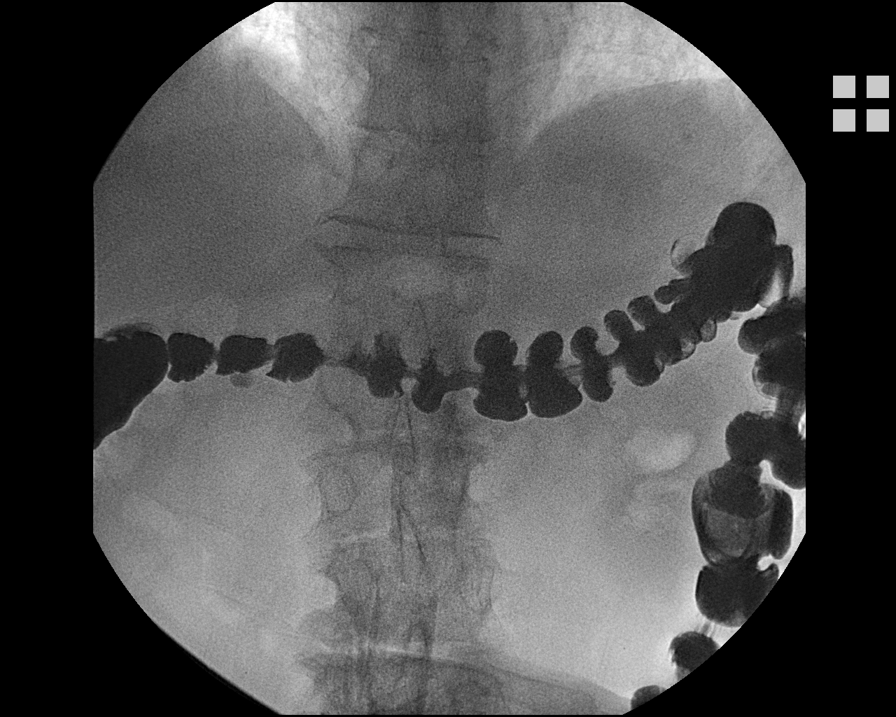
[im 2/8]
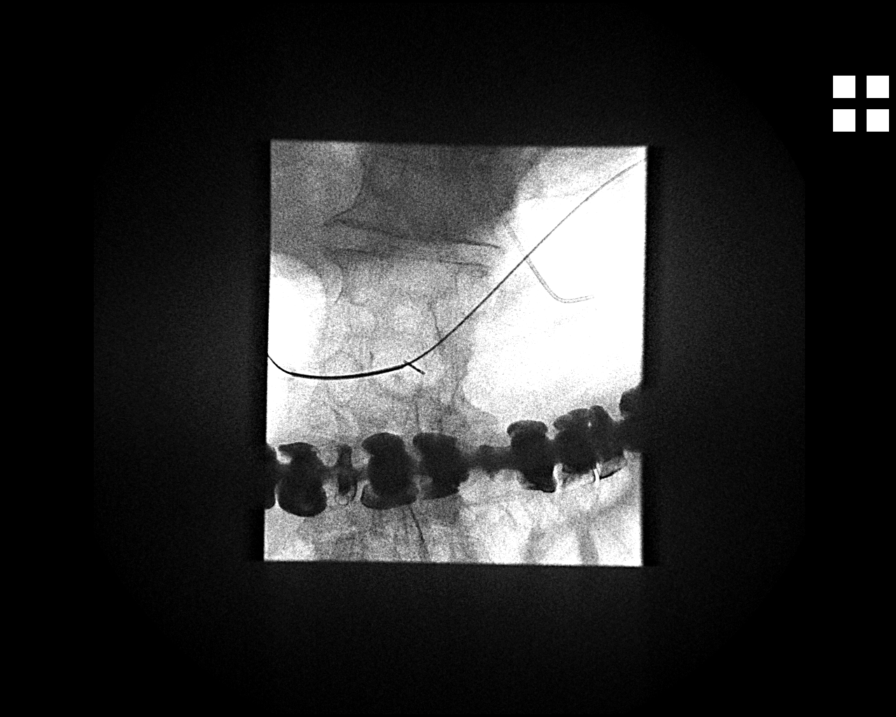
[im 3/8]
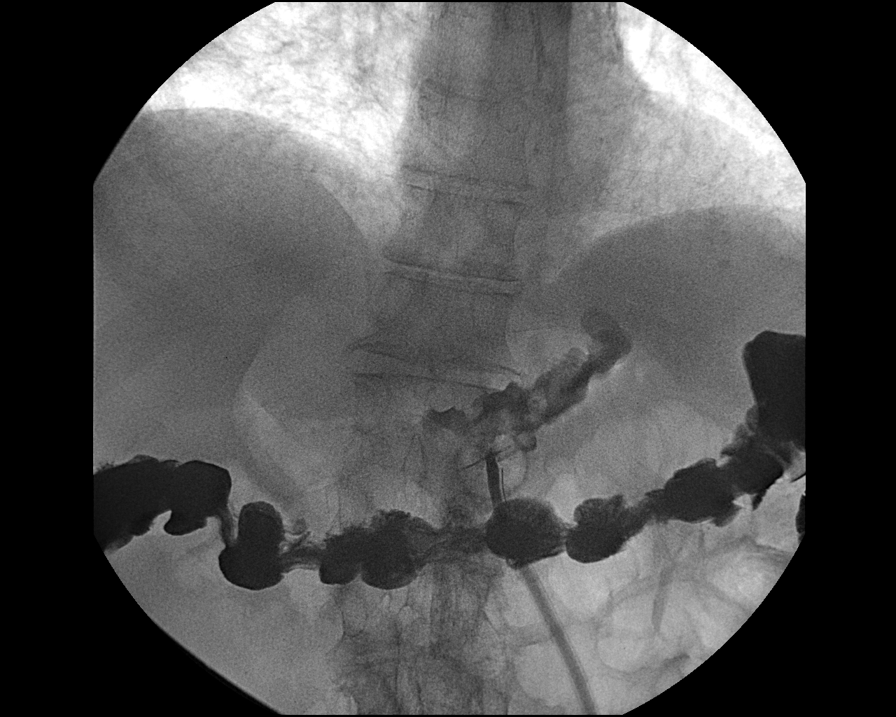
[im 4/8]
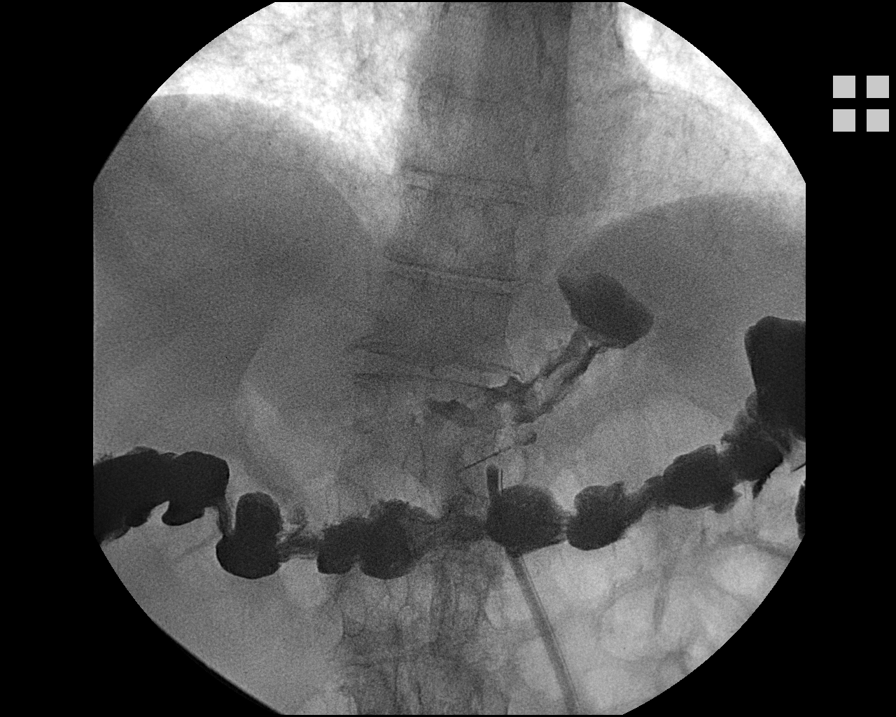
[im 5/8]
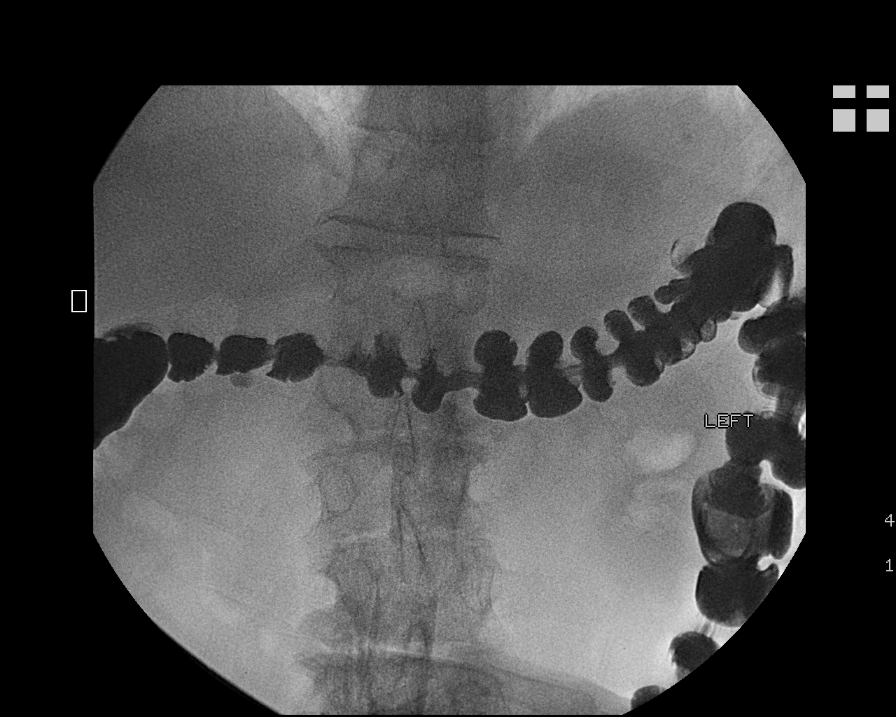
[im 6/8]
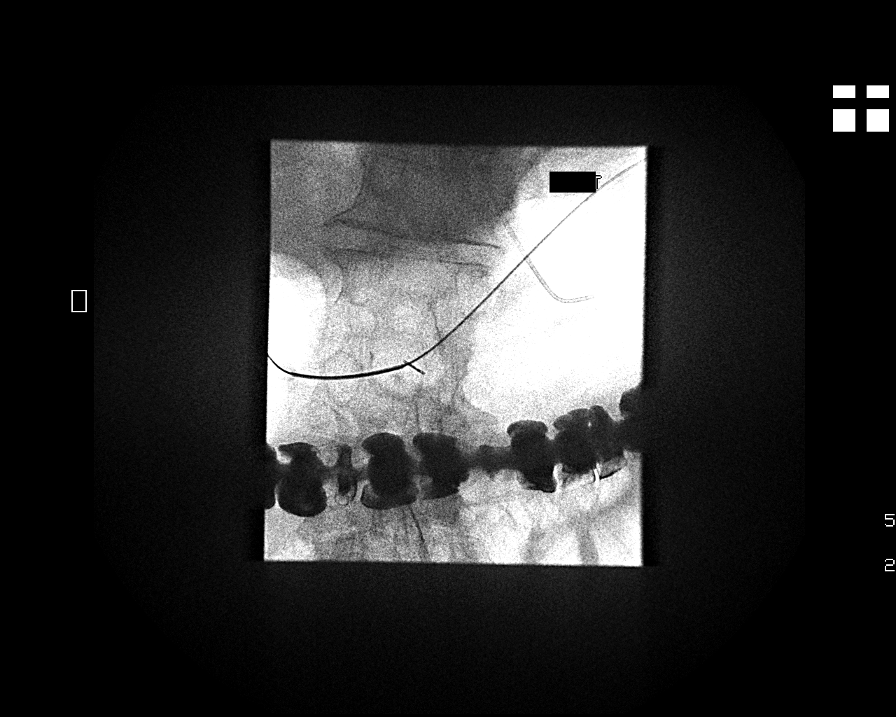
[im 7/8]
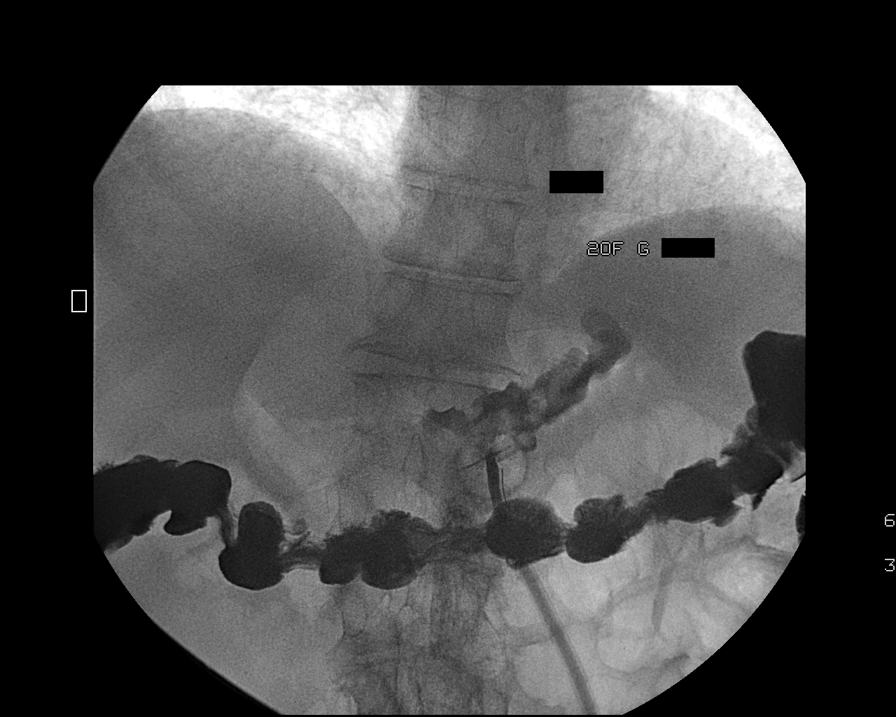
[im 8/8]
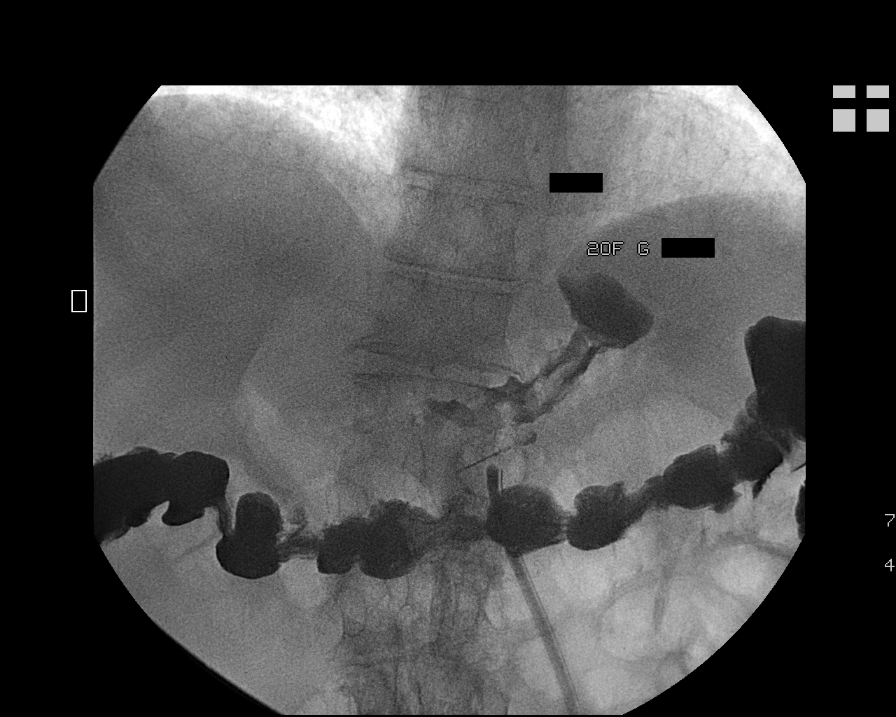

[8 of 8 positions shown; findings below may reference images not displayed]

FINDINGS: The image demonstrates placement of a 20-French pull-
through type gastrostomy tube into the body of the stomach.
IMPRESSION: Successful 20 French pull-through gastrostomy.

## 2015-09-17 ENCOUNTER — Other Ambulatory Visit: Payer: Self-pay | Admitting: Orthopaedic Surgery

## 2015-09-17 DIAGNOSIS — M545 Low back pain: Secondary | ICD-10-CM

## 2015-10-04 ENCOUNTER — Ambulatory Visit
Admission: RE | Admit: 2015-10-04 | Discharge: 2015-10-04 | Disposition: A | Discharge status: Home / Self Care | Payer: Medicare Other | Source: Ambulatory Visit | Attending: Orthopaedic Surgery | Admitting: Orthopaedic Surgery

## 2015-10-04 DIAGNOSIS — M545 Low back pain: Secondary | ICD-10-CM

## 2016-01-02 ENCOUNTER — Telehealth (INDEPENDENT_AMBULATORY_CARE_PROVIDER_SITE_OTHER): Payer: Self-pay | Admitting: Physical Medicine and Rehabilitation

## 2016-01-02 DIAGNOSIS — M545 Low back pain: Secondary | ICD-10-CM

## 2016-01-02 NOTE — Telephone Encounter (Signed)
L5-S1 interlam ok, saw him before and he did not want, note on [facility]

## 2016-01-03 NOTE — Addendum Note (Signed)
Addended by: Sherre Scarlet B on: 01/03/2016 09:29 AM   Modules accepted: Orders

## 2016-02-18 NOTE — Telephone Encounter (Signed)
Messages left for caregiver to call to schedule. Will wait for them to call back.

## 2017-11-07 DEATH — deceased
